# Patient Record
Sex: Female | Born: 1950 | Race: White | Hispanic: No | Marital: Married | Smoking: Never smoker
Health system: Southern US, Community
[De-identification: ages and names within clinical notes are randomized; demographics above are authoritative.]

## PROBLEM LIST (undated history)

## (undated) DIAGNOSIS — M797 Fibromyalgia: Secondary | ICD-10-CM

## (undated) DIAGNOSIS — E785 Hyperlipidemia, unspecified: Secondary | ICD-10-CM

## (undated) DIAGNOSIS — D259 Leiomyoma of uterus, unspecified: Secondary | ICD-10-CM

## (undated) DIAGNOSIS — F32A Depression, unspecified: Secondary | ICD-10-CM

## (undated) DIAGNOSIS — G4700 Insomnia, unspecified: Secondary | ICD-10-CM

## (undated) DIAGNOSIS — R112 Nausea with vomiting, unspecified: Secondary | ICD-10-CM

## (undated) DIAGNOSIS — M26609 Unspecified temporomandibular joint disorder, unspecified side: Secondary | ICD-10-CM

## (undated) DIAGNOSIS — Z9889 Other specified postprocedural states: Secondary | ICD-10-CM

## (undated) DIAGNOSIS — Z973 Presence of spectacles and contact lenses: Secondary | ICD-10-CM

## (undated) DIAGNOSIS — Z972 Presence of dental prosthetic device (complete) (partial): Secondary | ICD-10-CM

## (undated) DIAGNOSIS — F329 Major depressive disorder, single episode, unspecified: Secondary | ICD-10-CM

## (undated) HISTORY — PX: ABDOMINAL HYSTERECTOMY: SHX81

## (undated) HISTORY — PX: TOTAL ABDOMINAL HYSTERECTOMY: SHX209

## (undated) HISTORY — DX: Fibromyalgia: M79.7

## (undated) HISTORY — DX: Insomnia, unspecified: G47.00

## (undated) HISTORY — PX: OTHER SURGICAL HISTORY: SHX169

## (undated) HISTORY — DX: Depression, unspecified: F32.A

## (undated) HISTORY — DX: Leiomyoma of uterus, unspecified: D25.9

## (undated) HISTORY — DX: Hyperlipidemia, unspecified: E78.5

## (undated) HISTORY — DX: Unspecified temporomandibular joint disorder, unspecified side: M26.609

## (undated) HISTORY — PX: OOPHORECTOMY: SHX86

## (undated) HISTORY — DX: Major depressive disorder, single episode, unspecified: F32.9

---

## 2006-05-31 ENCOUNTER — Ambulatory Visit: Payer: Self-pay | Admitting: Obstetrics & Gynecology

## 2008-01-28 ENCOUNTER — Ambulatory Visit: Payer: Self-pay

## 2010-09-27 ENCOUNTER — Emergency Department: Payer: Self-pay | Admitting: Emergency Medicine

## 2012-05-07 ENCOUNTER — Ambulatory Visit: Payer: Self-pay | Admitting: Family Medicine

## 2012-05-08 ENCOUNTER — Ambulatory Visit: Payer: Self-pay | Admitting: Family Medicine

## 2012-05-20 ENCOUNTER — Ambulatory Visit: Payer: Self-pay | Admitting: Gastroenterology

## 2012-05-20 HISTORY — PX: COLONOSCOPY: SHX174

## 2012-11-05 ENCOUNTER — Ambulatory Visit: Payer: Self-pay | Admitting: Family Medicine

## 2013-07-17 ENCOUNTER — Ambulatory Visit: Payer: Self-pay | Admitting: Family Medicine

## 2014-05-08 DIAGNOSIS — F32A Depression, unspecified: Secondary | ICD-10-CM | POA: Insufficient documentation

## 2014-05-08 DIAGNOSIS — M797 Fibromyalgia: Secondary | ICD-10-CM | POA: Insufficient documentation

## 2014-05-08 DIAGNOSIS — F329 Major depressive disorder, single episode, unspecified: Secondary | ICD-10-CM | POA: Insufficient documentation

## 2014-05-08 DIAGNOSIS — N951 Menopausal and female climacteric states: Secondary | ICD-10-CM | POA: Insufficient documentation

## 2014-07-22 ENCOUNTER — Ambulatory Visit: Payer: Self-pay | Admitting: Family Medicine

## 2014-07-22 LAB — HM MAMMOGRAPHY: HM Mammogram: NORMAL (ref 0–4)

## 2014-11-18 DIAGNOSIS — E7800 Pure hypercholesterolemia, unspecified: Secondary | ICD-10-CM | POA: Insufficient documentation

## 2015-01-19 IMAGING — MG MM MAMMO DIAGNOSTIC UNILATERAL*L*
1 series · 6 of 6 positions shown · non-contrast
Comparison: none

REASON FOR EXAM: LT BR NODULARITY FU
COMMENTS:

[L CC · left · 6 of 6 slices shown]
[im 1/6]
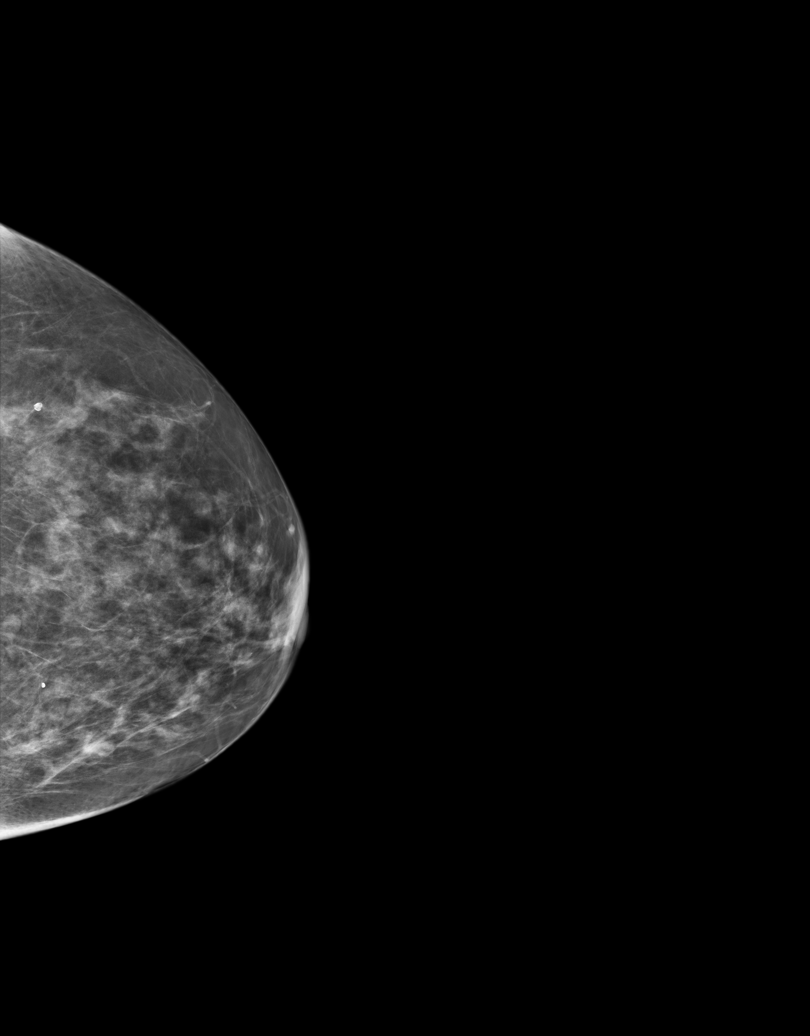
[im 2/6]
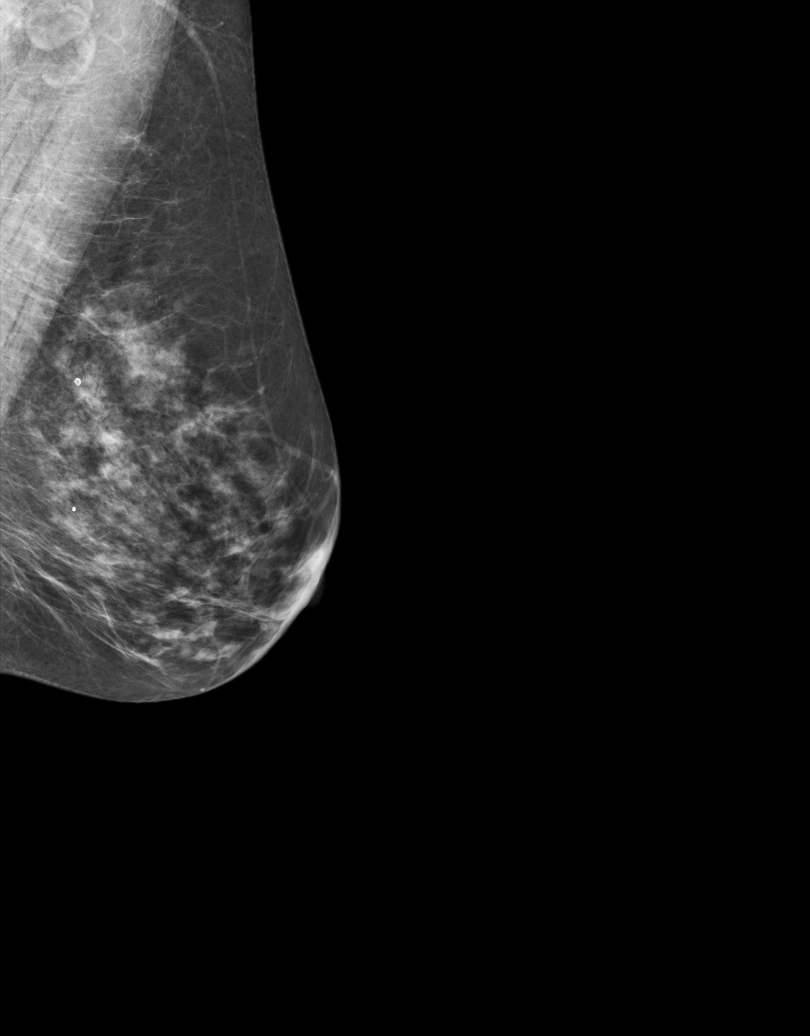
[im 3/6]
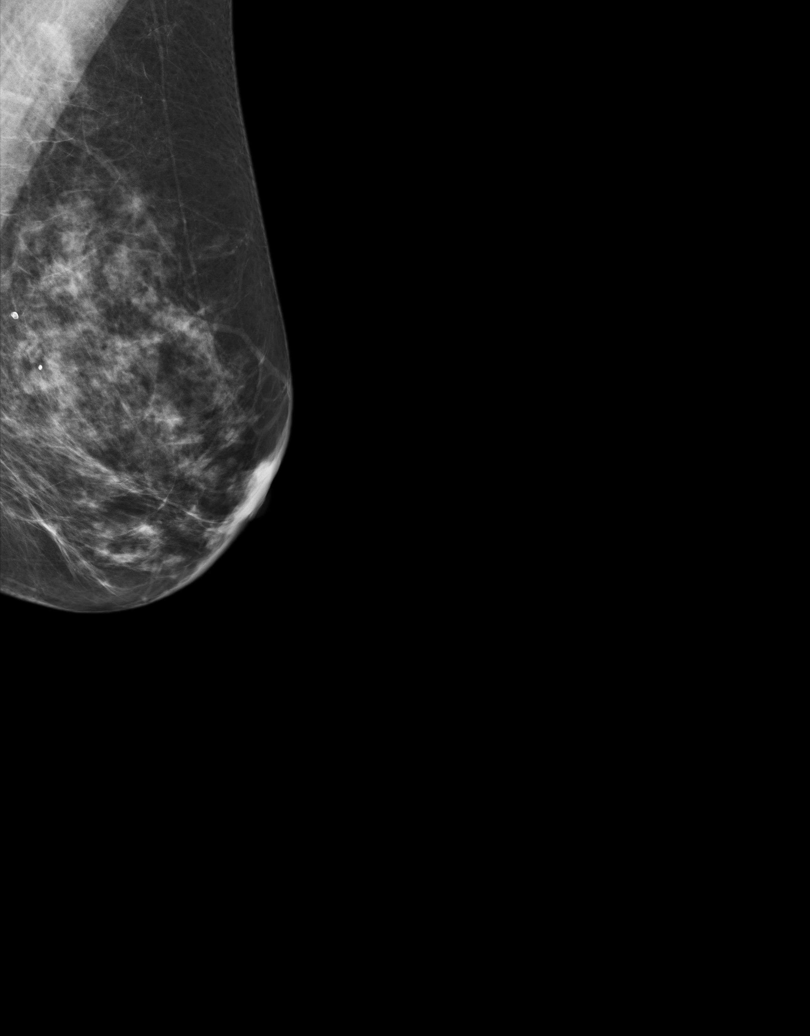
[im 4/6]
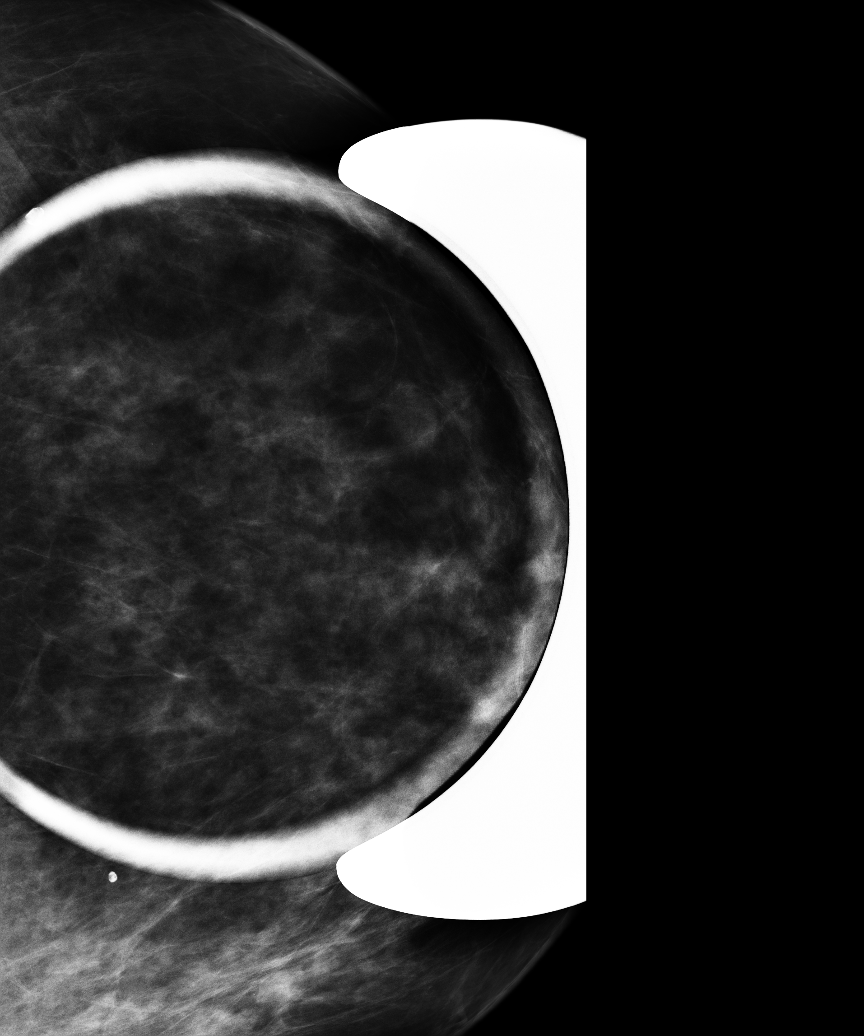
[im 5/6]
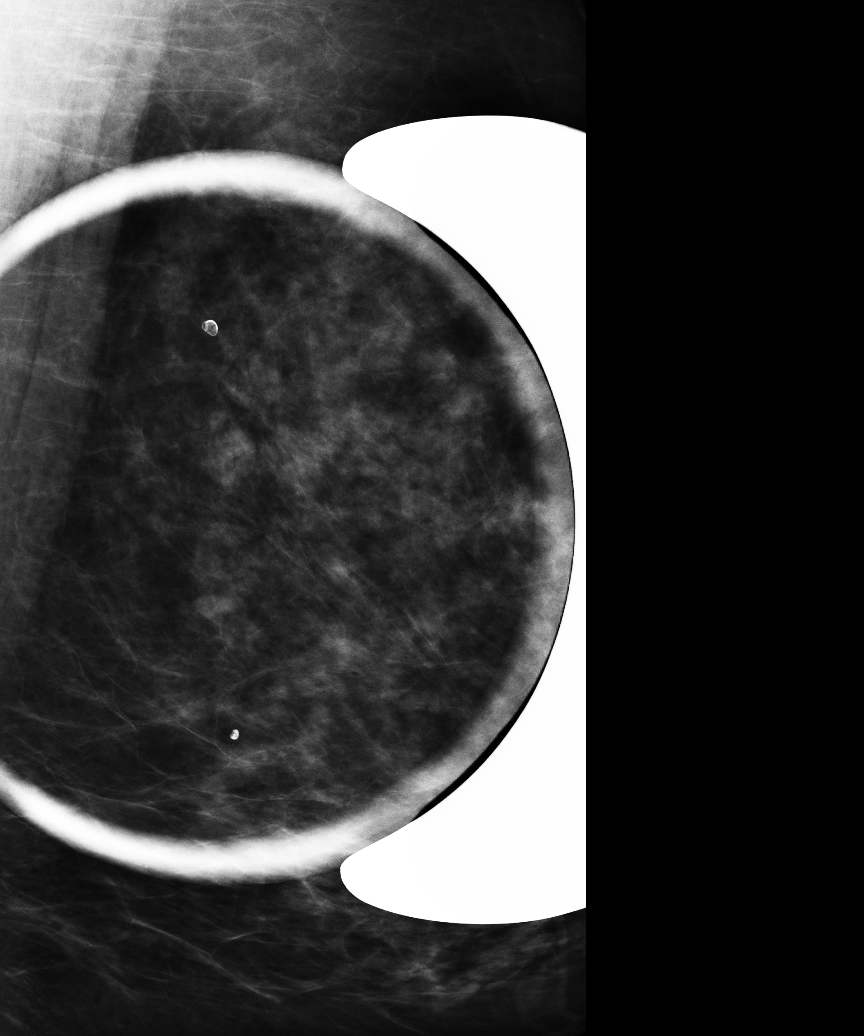
[im 6/6]
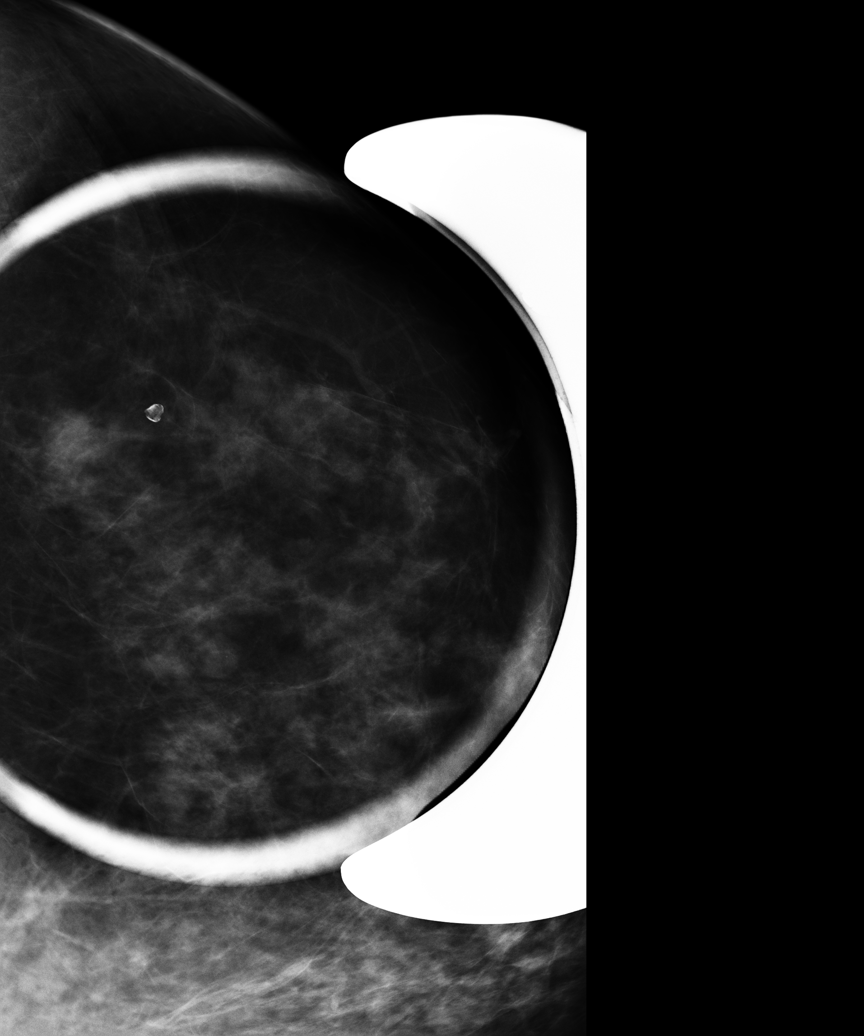

[6 of 6 positions shown; findings below may reference images not displayed]

PROCEDURE:     MAM - MAM DGTL UNI MAM LT BREAST W/CAD  - November 05, 2012  [DATE]

RESULT:     The patient returns for 6 month followup mammogram of the left
breast from the exam of May 07, 2012. At that time the patient was noted
to have nodularity on the left which at ultrasound was seen to be hypoechoic
without suspicious shadowing. The patient was asked to return for 6 month
followup mammography of the left breast to assure ongoing stability.

The left breast exhibits a heterogeneously dense parenchymal pattern with
evidence of ongoing involution. There is no dominant mass. There are
benign-appearing macrocalcifications. No areas of new architectural
distortion are demonstrated. An area of nodularity laterally on the left on
the cc view is unchanged. It does not appear as a discrete mass on the mlo
view.
IMPRESSION: I do not see findings suspicious for developing malignancy
on the left.

BI-RADS 3: An additional 6 month followup mammogram of the left breast is
recommended at which time the right breast would be due i.e. In May 2013.

BREAST COMPOSITION: The breast composition is HETEROGENEOUSLY DENSE
(glandular tissue is 51-75%) This may decrease the sensitivity of
mammography.

A NEGATIVE MAMMOGRAM REPORT DOES NOT PRECLUDE BIOPSY OR OTHER EVALUATION OF
A CLINICALLY PALPABLE OR OTHERWISE SUSPICIOUS MASS OR LESION. BREAST CANCER
MAY NOT BE DETECTED BY MAMMOGRAPHY IN UP TO 10% OF CASES.

[REDACTED]

## 2015-11-22 LAB — CBC AND DIFFERENTIAL
HCT: 38 % (ref 36–46)
HEMOGLOBIN: 12.7 g/dL (ref 12.0–16.0)
Platelets: 262 10*3/uL (ref 150–399)
WBC: 9.8 10*3/mL

## 2015-11-22 LAB — LIPID PANEL
Cholesterol: 222 mg/dL — AB (ref 0–200)
HDL: 60 mg/dL (ref 35–70)
LDL Cholesterol: 142 mg/dL
Triglycerides: 102 mg/dL (ref 40–160)

## 2015-11-22 LAB — BASIC METABOLIC PANEL
BUN: 15 mg/dL (ref 4–21)
CREATININE: 0.7 mg/dL (ref 0.5–1.1)
GLUCOSE: 89 mg/dL
POTASSIUM: 3.9 mmol/L (ref 3.4–5.3)
SODIUM: 139 mmol/L (ref 137–147)

## 2015-11-23 ENCOUNTER — Other Ambulatory Visit: Payer: Self-pay | Admitting: Family Medicine

## 2015-11-23 DIAGNOSIS — Z1231 Encounter for screening mammogram for malignant neoplasm of breast: Secondary | ICD-10-CM

## 2016-10-17 DIAGNOSIS — M797 Fibromyalgia: Secondary | ICD-10-CM

## 2016-12-05 ENCOUNTER — Encounter: Payer: Self-pay | Admitting: Family Medicine

## 2016-12-05 ENCOUNTER — Ambulatory Visit (INDEPENDENT_AMBULATORY_CARE_PROVIDER_SITE_OTHER): Payer: Self-pay | Admitting: Family Medicine

## 2016-12-05 VITALS — BP 112/64 | HR 76 | Temp 98.1°F | Ht 63.0 in | Wt 144.8 lb

## 2016-12-05 DIAGNOSIS — F3342 Major depressive disorder, recurrent, in full remission: Secondary | ICD-10-CM

## 2016-12-05 DIAGNOSIS — F5101 Primary insomnia: Secondary | ICD-10-CM

## 2016-12-05 DIAGNOSIS — E782 Mixed hyperlipidemia: Secondary | ICD-10-CM

## 2016-12-05 DIAGNOSIS — G4700 Insomnia, unspecified: Secondary | ICD-10-CM | POA: Insufficient documentation

## 2016-12-05 DIAGNOSIS — M797 Fibromyalgia: Secondary | ICD-10-CM

## 2016-12-05 MED ORDER — GABAPENTIN 300 MG PO CAPS: 900.0000 mg | ORAL_CAPSULE | Freq: Two times a day (BID) | ORAL | 1 refills | Status: DC

## 2016-12-05 MED ORDER — CITALOPRAM HYDROBROMIDE 40 MG PO TABS: 40.0000 mg | ORAL_TABLET | Freq: Every day | ORAL | 1 refills | Status: DC

## 2016-12-05 NOTE — Assessment & Plan Note (Signed)
Will recheck at physical. Continue diet and exercise.

## 2016-12-05 NOTE — Assessment & Plan Note (Signed)
Under good control on current regimen. Refills given. Call with any concerns.  

## 2016-12-05 NOTE — Assessment & Plan Note (Signed)
Under good control on current regimen. Discussed with patient that we don't prescribe controlled substances on the first visit. She is aware. Continue to monitor.

## 2016-12-05 NOTE — Progress Notes (Signed)
BP 112/64   Pulse 76   Temp 98.1 F (36.7 C)   Ht 5\' 3"  (1.6 m)   Wt 144 lb 12.8 oz (65.7 kg)   SpO2 97%   BMI 25.65 kg/m    Subjective:    Patient ID: Kathryn Conway, female    DOB: 06-29-1951, 66 y.o.   MRN: [id number]  HPI: Kathryn Conway is a 66 y.o. female  Chief Complaint  Patient presents with  . Establish Care   FIBROMYALGIA Pain status: controlled Satisfied with current treatment?: yes Medication side effects: no Medication compliance: excellent compliance Duration: chronic Location: generalized Quality: dull and aching Current pain level: mild Previous pain level: moderate Aggravating factors: stress Alleviating factors: medication Previous pain specialty evaluation: yes Non-narcotic analgesic meds: yes Narcotic contract:no Treatments attempted: celexa, gabapentin    DEPRESSION- patient denies any depression, states that her fibro has been treated with the celexa Mood status: controlled Satisfied with current treatment?: yes Symptom severity: mild  Duration of current treatment : chronic Side effects: no Medication compliance: excellent compliance Psychotherapy/counseling: no  Previous psychiatric medications: celexa Depressed mood: no Anxious mood: no Anhedonia: no Significant weight loss or gain: no Insomnia: yes  Fatigue: yes Feelings of worthlessness or guilt: no Impaired concentration/indecisiveness: no Suicidal ideations: no Hopelessness: no Crying spells: no Depression screen PHQ 2/9 12/05/2016  Decreased Interest 0  Down, Depressed, Hopeless 0  PHQ - 2 Score 0    HYPERLIPIDEMIA Hyperlipidemia status: Not on anything Satisfied with current treatment?  yes Side effects: Not on anything Past cholesterol meds: none Supplements: none Aspirin:  no The 10-year ASCVD risk score [name] DC Jr., et al., 2013) is: 3.6%   Values used to calculate the score:     Age: 46 years     Sex: Female     Is Non-Hispanic African American:  No     Diabetic: No     Tobacco smoker: No     Systolic Blood Pressure: 109 mmHg     Is BP treated: No     HDL Cholesterol: 60 mg/dL     Total Cholesterol: 222 mg/dL Chest pain:  no Coronary artery disease:  no Family history CAD:  yes  ELEVATED BLOOD PRESSURE- had extra coffee today Duration of elevated BP: unknown BP monitoring frequency: not checking Previous BP meds: no Recent stressors: no Family history of hypertension: yes Recurrent headaches: no Visual changes: no Palpitations: no  Dyspnea: no Chest pain: no Lower extremity edema: no Dizzy/lightheaded: no Transient ischemic attacks: yes- about 5 years ago  INSOMNIA- has only taken [place] for it. Tries to take [place] as needed, usually 4-5x a week Duration: chronic- since she was in her 53s Satisfied with sleep quality: no Difficulty falling asleep: yes Difficulty staying asleep: yes Waking a few hours after sleep onset: yes Early morning awakenings: yes Daytime hypersomnolence: yes Wakes feeling refreshed: no Good sleep hygiene: yes Apnea: no Snoring: no Depressed/anxious mood: no Recent stress: no Restless legs/nocturnal leg cramps: no Chronic pain/arthritis: no History of sleep study: no Treatments attempted: benadryl and ambien    Active Ambulatory Problems    Diagnosis Date Noted  . Depression   . Fibromyalgia   . Hyperlipidemia 12/05/2016  . Insomnia 12/05/2016   Resolved Ambulatory Problems    Diagnosis Date Noted  . No Resolved Ambulatory Problems   Past Medical History:  Diagnosis Date  . Depression   . Fibromyalgia   . Hyperlipidemia   . Insomnia   . TMJ (temporomandibular  joint disorder)   . Uterine fibroid    Past Surgical History:  Procedure Laterality Date  . ABDOMINAL HYSTERECTOMY    . COLONOSCOPY  05/20/2012   repeat 5 years  . laparoscopic gyn procedure     done before her hysterectomy  . OOPHORECTOMY Left    Outpatient Encounter Prescriptions as of 12/05/2016   Medication Sig  . aspirin EC 81 MG tablet Take 81 mg by mouth daily.  . citalopram (CELEXA) 40 MG tablet Take 1 tablet (40 mg total) by mouth daily.  Marland Kitchen gabapentin (NEURONTIN) 300 MG capsule Take 3 capsules (900 mg total) by mouth 2 (two) times daily.  Marland Kitchen loratadine (CLARITIN) 10 MG tablet Take 10 mg by mouth daily.  Marland Kitchen zolpidem (AMBIEN) 10 MG tablet Take 5 mg by mouth at bedtime as needed.   . [DISCONTINUED] citalopram (CELEXA) 40 MG tablet Take by mouth.  . [DISCONTINUED] gabapentin (NEURONTIN) 300 MG capsule TAKE TWO CAPSULES BY MOUTH THREE TIMES DAILY   No facility-administered encounter medications on file as of 12/05/2016.    Allergies  Allergen Reactions  . Sulfa Antibiotics Rash   Family History  Problem Relation Age of Onset  . Heart disease Mother   . Hypertension Mother   . Depression Mother   . Osteoporosis Mother   . Colon polyps Mother   . Cirrhosis Mother        non alcoholic  . Liver disease Mother        fatty liver disease  . Heart disease Father   . Hypertension Father   . Colon polyps Father   . Stroke Father   . Heart disease Sister   . Hypertension Sister   . Colon polyps Sister   . Diabetes Sister   . Hypertension Daughter   . Colon polyps Daughter   . Heart attack Brother   . Hypertension Brother   . Heart attack Brother   . Hyperlipidemia Son    Social History   Social History  . Marital status: Single    Spouse name: N/A  . Number of children: N/A  . Years of education: N/A   Social History Main Topics  . Smoking status: Never Smoker  . Smokeless tobacco: Never Used  . Alcohol use 1.8 oz/week    3 Glasses of wine per week  . Drug use: No  . Sexual activity: Yes   Other Topics Concern  . None   Social History Narrative  . None   Review of Systems  Constitutional: Negative.   Respiratory: Negative.   Cardiovascular: Negative.   Musculoskeletal: Positive for arthralgias and myalgias. Negative for back pain, gait problem, joint  swelling, neck pain and neck stiffness.  Psychiatric/Behavioral: Positive for decreased concentration and sleep disturbance. Negative for agitation, behavioral problems, confusion, dysphoric mood, hallucinations, self-injury and suicidal ideas. The patient is not nervous/anxious and is not hyperactive.     Per HPI unless specifically indicated above     Objective:    BP 112/64   Pulse 76   Temp 98.1 F (36.7 C)   Ht 5\' 3"  (1.6 m)   Wt 144 lb 12.8 oz (65.7 kg)   SpO2 97%   BMI 25.65 kg/m   Wt Readings from Last 3 Encounters:  12/05/16 144 lb 12.8 oz (65.7 kg)    Physical Exam  Constitutional: She is oriented to person, place, and time. She appears well-developed and well-nourished. No distress.  HENT:  Head: Normocephalic and atraumatic.  Right Ear: Hearing normal.  Left  Ear: Hearing normal.  Nose: Nose normal.  Eyes: Conjunctivae and lids are normal. Right eye exhibits no discharge. Left eye exhibits no discharge. No scleral icterus.  Cardiovascular: Normal rate, regular rhythm, normal heart sounds and intact distal pulses.  Exam reveals no gallop and no friction rub.   No murmur heard. Pulmonary/Chest: Effort normal and breath sounds normal. No respiratory distress. She has no wheezes. She has no rales. She exhibits no tenderness.  Musculoskeletal: Normal range of motion.  Neurological: She is alert and oriented to person, place, and time.  Skin: Skin is warm, dry and intact. No rash noted. She is not diaphoretic. No erythema. No pallor.  Psychiatric: She has a normal mood and affect. Her speech is normal and behavior is normal. Judgment and thought content normal. Cognition and memory are normal.  Nursing note and vitals reviewed.   Results for orders placed or performed in visit on 12/05/16  CBC and differential  Result Value Ref Range   Hemoglobin 12.7 12.0 - 16.0 g/dL   HCT 38 36 - 46 %   Platelets 262 150 - 399 K/L   WBC 9.8 93^2/TF  Basic metabolic panel  Result  Value Ref Range   Glucose 89 mg/dL   BUN 15 4 - 21 mg/dL   Creatinine 0.7 0.5 - 1.1 mg/dL   Potassium 3.9 3.4 - 5.3 mmol/L   Sodium 139 137 - 147 mmol/L  Lipid panel  Result Value Ref Range   Triglycerides 102 40 - 160 mg/dL   Cholesterol 222 (A) 0 - 200 mg/dL   HDL 60 35 - 70 mg/dL   LDL Cholesterol 142 mg/dL      Assessment & Plan:   Problem List Items Addressed This Visit      Other   Depression - Primary    Under good control on current regimen. Refills given. Call with any concerns.       Relevant Medications   citalopram (CELEXA) 40 MG tablet   Fibromyalgia    Under good control on current regimen. Refills given. Call with any concerns.       Hyperlipidemia    Will recheck at physical. Continue diet and exercise.       Relevant Medications   aspirin EC 81 MG tablet   Insomnia    Under good control on current regimen. Discussed with patient that we don't prescribe controlled substances on the first visit. She is aware. Continue to monitor.           Follow up plan: Return 1-6 months, for Physical and insomnia.

## 2016-12-21 ENCOUNTER — Encounter: Payer: Self-pay | Admitting: Family Medicine

## 2016-12-21 ENCOUNTER — Ambulatory Visit (INDEPENDENT_AMBULATORY_CARE_PROVIDER_SITE_OTHER): Payer: Self-pay | Admitting: Family Medicine

## 2016-12-21 VITALS — BP 125/79 | HR 72 | Temp 98.6°F | Ht 64.1 in | Wt 144.1 lb

## 2016-12-21 DIAGNOSIS — Z0000 Encounter for general adult medical examination without abnormal findings: Secondary | ICD-10-CM

## 2016-12-21 DIAGNOSIS — F5101 Primary insomnia: Secondary | ICD-10-CM

## 2016-12-21 MED ORDER — ZOLPIDEM TARTRATE 10 MG PO TABS: 5.0000 mg | ORAL_TABLET | Freq: Every evening | ORAL | 5 refills | Status: DC | PRN

## 2016-12-21 MED ORDER — ZOLPIDEM TARTRATE 10 MG PO TABS: 5.0000 mg | ORAL_TABLET | Freq: Every evening | ORAL | 1 refills | Status: DC | PRN

## 2016-12-21 NOTE — Patient Instructions (Addendum)
Health Maintenance, Female Adopting a healthy lifestyle and getting preventive care can go a long way to promote health and wellness. Talk with your health care provider about what schedule of regular examinations is right for you. This is a good chance for you to check in with your provider about disease prevention and staying healthy. In between checkups, there are plenty of things you can do on your own. Experts have done a lot of research about which lifestyle changes and preventive measures are most likely to keep you healthy. Ask your health care provider for more information. Weight and diet Eat a healthy diet  Be sure to include plenty of vegetables, fruits, low-fat dairy products, and lean protein.  Do not eat a lot of foods high in solid fats, added sugars, or salt.  Get regular exercise. This is one of the most important things you can do for your health. ? Most adults should exercise for at least 150 minutes each week. The exercise should increase your heart rate and make you sweat (moderate-intensity exercise). ? Most adults should also do strengthening exercises at least twice a week. This is in addition to the moderate-intensity exercise.  Maintain a healthy weight  Body mass index (BMI) is a measurement that can be used to identify possible weight problems. It estimates body fat based on height and weight. Your health care provider can help determine your BMI and help you achieve or maintain a healthy weight.  For females 20 years of age and older: ? A BMI below 18.5 is considered underweight. ? A BMI of 18.5 to 24.9 is normal. ? A BMI of 25 to 29.9 is considered overweight. ? A BMI of 30 and above is considered obese.  Watch levels of cholesterol and blood lipids  You should start having your blood tested for lipids and cholesterol at 64 years of age, then have this test every 5 years.  You may need to have your cholesterol levels checked more often if: ? Your lipid or  cholesterol levels are high. ? You are older than 64 years of age. ? You are at high risk for heart disease.  Cancer screening Lung Cancer  Lung cancer screening is recommended for adults 55-80 years old who are at high risk for lung cancer because of a history of smoking.  A yearly low-dose CT scan of the lungs is recommended for people who: ? Currently smoke. ? Have quit within the past 15 years. ? Have at least a 30-pack-year history of smoking. A pack year is smoking an average of one pack of cigarettes a day for 1 year.  Yearly screening should continue until it has been 15 years since you quit.  Yearly screening should stop if you develop a health problem that would prevent you from having lung cancer treatment.  Breast Cancer  Practice breast self-awareness. This means understanding how your breasts normally appear and feel.  It also means doing regular breast self-exams. Let your health care provider know about any changes, no matter how small.  If you are in your 20s or 30s, you should have a clinical breast exam (CBE) by a health care provider every 1-3 years as part of a regular health exam.  If you are 40 or older, have a CBE every year. Also consider having a breast X-ray (mammogram) every year.  If you have a family history of breast cancer, talk to your health care provider about genetic screening.  If you are at high risk   for breast cancer, talk to your health care provider about having an MRI and a mammogram every year.  Breast cancer gene (BRCA) assessment is recommended for women who have family members with BRCA-related cancers. BRCA-related cancers include: ? Breast. ? Ovarian. ? Tubal. ? Peritoneal cancers.  Results of the assessment will determine the need for genetic counseling and BRCA1 and BRCA2 testing.  Cervical Cancer Your health care provider may recommend that you be screened regularly for cancer of the pelvic organs (ovaries, uterus, and  vagina). This screening involves a pelvic examination, including checking for microscopic changes to the surface of your cervix (Pap test). You may be encouraged to have this screening done every 3 years, beginning at age 22.  For women ages 56-65, health care providers may recommend pelvic exams and Pap testing every 3 years, or they may recommend the Pap and pelvic exam, combined with testing for human papilloma virus (HPV), every 5 years. Some types of HPV increase your risk of cervical cancer. Testing for HPV may also be done on women of any age with unclear Pap test results.  Other health care providers may not recommend any screening for nonpregnant women who are considered low risk for pelvic cancer and who do not have symptoms. Ask your health care provider if a screening pelvic exam is right for you.  If you have had past treatment for cervical cancer or a condition that could lead to cancer, you need Pap tests and screening for cancer for at least 20 years after your treatment. If Pap tests have been discontinued, your risk factors (such as having a new sexual partner) need to be reassessed to determine if screening should resume. Some women have medical problems that increase the chance of getting cervical cancer. In these cases, your health care provider may recommend more frequent screening and Pap tests.  Colorectal Cancer  This type of cancer can be detected and often prevented.  Routine colorectal cancer screening usually begins at 64 years of age and continues through 64 years of age.  Your health care provider may recommend screening at an earlier age if you have risk factors for colon cancer.  Your health care provider may also recommend using home test kits to check for hidden blood in the stool.  A small camera at the end of a tube can be used to examine your colon directly (sigmoidoscopy or colonoscopy). This is done to check for the earliest forms of colorectal  cancer.  Routine screening usually begins at age 33.  Direct examination of the colon should be repeated every 5-10 years through 64 years of age. However, you may need to be screened more often if early forms of precancerous polyps or small growths are found.  Skin Cancer  Check your skin from head to toe regularly.  Tell your health care provider about any new moles or changes in moles, especially if there is a change in a mole's shape or color.  Also tell your health care provider if you have a mole that is larger than the size of a pencil eraser.  Always use sunscreen. Apply sunscreen liberally and repeatedly throughout the day.  Protect yourself by wearing long sleeves, pants, a wide-brimmed hat, and sunglasses whenever you are outside.  Heart disease, diabetes, and high blood pressure  High blood pressure causes heart disease and increases the risk of stroke. High blood pressure is more likely to develop in: ? People who have blood pressure in the high end of  the normal range (130-139/85-89 mm Hg). ? People who are overweight or obese. ? People who are African American.  If you are 21-29 years of age, have your blood pressure checked every 3-5 years. If you are 3 years of age or older, have your blood pressure checked every year. You should have your blood pressure measured twice-once when you are at a hospital or clinic, and once when you are not at a hospital or clinic. Record the average of the two measurements. To check your blood pressure when you are not at a hospital or clinic, you can use: ? An automated blood pressure machine at a pharmacy. ? A home blood pressure monitor.  If you are between 17 years and 37 years old, ask your health care provider if you should take aspirin to prevent strokes.  Have regular diabetes screenings. This involves taking a blood sample to check your fasting blood sugar level. ? If you are at a normal weight and have a low risk for diabetes,  have this test once every three years after 64 years of age. ? If you are overweight and have a high risk for diabetes, consider being tested at a younger age or more often. Preventing infection Hepatitis B  If you have a higher risk for hepatitis B, you should be screened for this virus. You are considered at high risk for hepatitis B if: ? You were born in a country where hepatitis B is common. Ask your health care provider which countries are considered high risk. ? Your parents were born in a high-risk country, and you have not been immunized against hepatitis B (hepatitis B vaccine). ? You have HIV or AIDS. ? You use needles to inject street drugs. ? You live with someone who has hepatitis B. ? You have had sex with someone who has hepatitis B. ? You get hemodialysis treatment. ? You take certain medicines for conditions, including cancer, organ transplantation, and autoimmune conditions.  Hepatitis C  Blood testing is recommended for: ? Everyone born from [birth year] through 1963. ? Anyone with known risk factors for hepatitis C.  Sexually transmitted infections (STIs)  You should be screened for sexually transmitted infections (STIs) including gonorrhea and chlamydia if: ? You are sexually active and are younger than 64 years of age. ? You are older than 64 years of age and your health care provider tells you that you are at risk for this type of infection. ? Your sexual activity has changed since you were last screened and you are at an increased risk for chlamydia or gonorrhea. Ask your health care provider if you are at risk.  If you do not have HIV, but are at risk, it may be recommended that you take a prescription medicine daily to prevent HIV infection. This is called pre-exposure prophylaxis (PrEP). You are considered at risk if: ? You are sexually active and do not regularly use condoms or know the HIV status of your partner(s). ? You take drugs by injection. ? You are  sexually active with a partner who has HIV.  Talk with your health care provider about whether you are at high risk of being infected with HIV. If you choose to begin PrEP, you should first be tested for HIV. You should then be tested every 3 months for as long as you are taking PrEP. Pregnancy  If you are premenopausal and you may become pregnant, ask your health care provider about preconception counseling.  If you may become  pregnant, take 400 to 800 micrograms (mcg) of folic acid every day.  If you want to prevent pregnancy, talk to your health care provider about birth control (contraception). Osteoporosis and menopause  Osteoporosis is a disease in which the bones lose minerals and strength with aging. This can result in serious bone fractures. Your risk for osteoporosis can be identified using a bone density scan.  If you are 28 years of age or older, or if you are at risk for osteoporosis and fractures, ask your health care provider if you should be screened.  Ask your health care provider whether you should take a calcium or vitamin D supplement to lower your risk for osteoporosis.  Menopause may have certain physical symptoms and risks.  Hormone replacement therapy may reduce some of these symptoms and risks. Talk to your health care provider about whether hormone replacement therapy is right for you. Follow these instructions at home:  Schedule regular health, dental, and eye exams.  Stay current with your immunizations.  Do not use any tobacco products including cigarettes, chewing tobacco, or electronic cigarettes.  If you are pregnant, do not drink alcohol.  If you are breastfeeding, limit how much and how often you drink alcohol.  Limit alcohol intake to no more than 1 drink per day for nonpregnant women. One drink equals 12 ounces of beer, 5 ounces of wine, or 1 ounces of hard liquor.  Do not use street drugs.  Do not share needles.  Ask your health care  provider for help if you need support or information about quitting drugs.  Tell your health care provider if you often feel depressed.  Tell your health care provider if you have ever been abused or do not feel safe at home. This information is not intended to replace advice given to you by your health care provider. Make sure you discuss any questions you have with your health care provider. Document Released: 01/02/2011 Document Revised: 11/25/2015 Document Reviewed: 03/23/2015 Elsevier Interactive Patient Education  2018 [place] Maintenance for Postmenopausal Women Menopause is a normal process in which your reproductive ability comes to an end. This process happens gradually over a span of months to years, usually between the ages of 96 and 42. Menopause is complete when you have missed 12 consecutive menstrual periods. It is important to talk with your health care provider about some of the most common conditions that affect postmenopausal women, such as heart disease, cancer, and bone loss (osteoporosis). Adopting a healthy lifestyle and getting preventive care can help to promote your health and wellness. Those actions can also lower your chances of developing some of these common conditions. What should I know about menopause? During menopause, you may experience a number of symptoms, such as:  Moderate-to-severe hot flashes.  Night sweats.  Decrease in sex drive.  Mood swings.  Headaches.  Tiredness.  Irritability.  Memory problems.  Insomnia.  Choosing to treat or not to treat menopausal changes is an individual decision that you make with your health care provider. What should I know about hormone replacement therapy and supplements? Hormone therapy products are effective for treating symptoms that are associated with menopause, such as hot flashes and night sweats. Hormone replacement carries certain risks, especially as you become older. If you are  thinking about using estrogen or estrogen with progestin treatments, discuss the benefits and risks with your health care provider. What should I know about heart disease and stroke? Heart disease, heart attack, and stroke  become more likely as you age. This may be due, in part, to the hormonal changes that your body experiences during menopause. These can affect how your body processes dietary fats, triglycerides, and cholesterol. Heart attack and stroke are both medical emergencies. There are many things that you can do to help prevent heart disease and stroke:  Have your blood pressure checked at least every 1-2 years. High blood pressure causes heart disease and increases the risk of stroke.  If you are 55-79 years old, ask your health care provider if you should take aspirin to prevent a heart attack or a stroke.  Do not use any tobacco products, including cigarettes, chewing tobacco, or electronic cigarettes. If you need help quitting, ask your health care provider.  It is important to eat a healthy diet and maintain a healthy weight. ? Be sure to include plenty of vegetables, fruits, low-fat dairy products, and lean protein. ? Avoid eating foods that are high in solid fats, added sugars, or salt (sodium).  Get regular exercise. This is one of the most important things that you can do for your health. ? Try to exercise for at least 150 minutes each week. The type of exercise that you do should increase your heart rate and make you sweat. This is known as moderate-intensity exercise. ? Try to do strengthening exercises at least twice each week. Do these in addition to the moderate-intensity exercise.  Know your numbers.Ask your health care provider to check your cholesterol and your blood glucose. Continue to have your blood tested as directed by your health care provider.  What should I know about cancer screening? There are several types of cancer. Take the following steps to reduce  your risk and to catch any cancer development as early as possible. Breast Cancer  Practice breast self-awareness. ? This means understanding how your breasts normally appear and feel. ? It also means doing regular breast self-exams. Let your health care provider know about any changes, no matter how small.  If you are 40 or older, have a clinician do a breast exam (clinical breast exam or CBE) every year. Depending on your age, family history, and medical history, it may be recommended that you also have a yearly breast X-ray (mammogram).  If you have a family history of breast cancer, talk with your health care provider about genetic screening.  If you are at high risk for breast cancer, talk with your health care provider about having an MRI and a mammogram every year.  Breast cancer (BRCA) gene test is recommended for women who have family members with BRCA-related cancers. Results of the assessment will determine the need for genetic counseling and BRCA1 and for BRCA2 testing. BRCA-related cancers include these types: ? Breast. This occurs in males or females. ? Ovarian. ? Tubal. This may also be called fallopian tube cancer. ? Cancer of the abdominal or pelvic lining (peritoneal cancer). ? Prostate. ? Pancreatic.  Cervical, Uterine, and Ovarian Cancer Your health care provider may recommend that you be screened regularly for cancer of the pelvic organs. These include your ovaries, uterus, and vagina. This screening involves a pelvic exam, which includes checking for microscopic changes to the surface of your cervix (Pap test).  For women ages 21-65, health care providers may recommend a pelvic exam and a Pap test every three years. For women ages 30-65, they may recommend the Pap test and pelvic exam, combined with testing for human papilloma virus (HPV), every five years. Some types   of HPV increase your risk of cervical cancer. Testing for HPV may also be done on women of any age who  have unclear Pap test results.  Other health care providers may not recommend any screening for nonpregnant women who are considered low risk for pelvic cancer and have no symptoms. Ask your health care provider if a screening pelvic exam is right for you.  If you have had past treatment for cervical cancer or a condition that could lead to cancer, you need Pap tests and screening for cancer for at least 20 years after your treatment. If Pap tests have been discontinued for you, your risk factors (such as having a new sexual partner) need to be reassessed to determine if you should start having screenings again. Some women have medical problems that increase the chance of getting cervical cancer. In these cases, your health care provider may recommend that you have screening and Pap tests more often.  If you have a family history of uterine cancer or ovarian cancer, talk with your health care provider about genetic screening.  If you have vaginal bleeding after reaching menopause, tell your health care provider.  There are currently no reliable tests available to screen for ovarian cancer.  Lung Cancer Lung cancer screening is recommended for adults 20-34 years old who are at high risk for lung cancer because of a history of smoking. A yearly low-dose CT scan of the lungs is recommended if you:  Currently smoke.  Have a history of at least 30 pack-years of smoking and you currently smoke or have quit within the past 15 years. A pack-year is smoking an average of one pack of cigarettes per day for one year.  Yearly screening should:  Continue until it has been 15 years since you quit.  Stop if you develop a health problem that would prevent you from having lung cancer treatment.  Colorectal Cancer  This type of cancer can be detected and can often be prevented.  Routine colorectal cancer screening usually begins at age 91 and continues through age 40.  If you have risk factors for colon  cancer, your health care provider may recommend that you be screened at an earlier age.  If you have a family history of colorectal cancer, talk with your health care provider about genetic screening.  Your health care provider may also recommend using home test kits to check for hidden blood in your stool.  A small camera at the end of a tube can be used to examine your colon directly (sigmoidoscopy or colonoscopy). This is done to check for the earliest forms of colorectal cancer.  Direct examination of the colon should be repeated every 5-10 years until age 59. However, if early forms of precancerous polyps or small growths are found or if you have a family history or genetic risk for colorectal cancer, you may need to be screened more often.  Skin Cancer  Check your skin from head to toe regularly.  Monitor any moles. Be sure to tell your health care provider: ? About any new moles or changes in moles, especially if there is a change in a mole's shape or color. ? If you have a mole that is larger than the size of a pencil eraser.  If any of your family members has a history of skin cancer, especially at a young age, talk with your health care provider about genetic screening.  Always use sunscreen. Apply sunscreen liberally and repeatedly throughout the day.  Whenever you are outside, protect yourself by wearing long sleeves, pants, a wide-brimmed hat, and sunglasses.  What should I know about osteoporosis? Osteoporosis is a condition in which bone destruction happens more quickly than new bone creation. After menopause, you may be at an increased risk for osteoporosis. To help prevent osteoporosis or the bone fractures that can happen because of osteoporosis, the following is recommended:  If you are 13-49 years old, get at least 1,000 mg of calcium and at least 600 mg of vitamin D per day.  If you are older than age 83 but younger than age 38, get at least 1,200 mg of calcium and  at least 600 mg of vitamin D per day.  If you are older than age 77, get at least 1,200 mg of calcium and at least 800 mg of vitamin D per day.  Smoking and excessive alcohol intake increase the risk of osteoporosis. Eat foods that are rich in calcium and vitamin D, and do weight-bearing exercises several times each week as directed by your health care provider. What should I know about how menopause affects my mental health? Depression may occur at any age, but it is more common as you become older. Common symptoms of depression include:  Low or sad mood.  Changes in sleep patterns.  Changes in appetite or eating patterns.  Feeling an overall lack of motivation or enjoyment of activities that you previously enjoyed.  Frequent crying spells.  Talk with your health care provider if you think that you are experiencing depression. What should I know about immunizations? It is important that you get and maintain your immunizations. These include:  Tetanus, diphtheria, and pertussis (Tdap) booster vaccine.  Influenza every year before the flu season begins.  Pneumonia vaccine.  Shingles vaccine.  Your health care provider may also recommend other immunizations. This information is not intended to replace advice given to you by your health care provider. Make sure you discuss any questions you have with your health care provider. Document Released: 08/11/2005 Document Revised: 01/07/2016 Document Reviewed: 03/23/2015 Elsevier Interactive Patient Education  2018 [employer].

## 2016-12-21 NOTE — Progress Notes (Deleted)
There were no vitals taken for this visit.   Subjective:    Patient ID: Kathryn Conway, female    DOB: 1950/09/10, 66 y.o.   MRN: [id number]  HPI: Kathryn Conway is a 66 y.o. female  No chief complaint on file.  HYPERLIPIDEMIA Hyperlipidemia status: {Blank single:19197::"excellent compliance","good compliance","fair compliance","poor compliance"} Satisfied with current treatment?  {Blank single:19197::"yes","no"} Side effects:  {Blank single:19197::"yes","no"} Medication compliance: {Blank single:19197::"excellent compliance","good compliance","fair compliance","poor compliance"} Past cholesterol meds: {Blank multiple:19196::"none","atorvastain (lipitor)","lovastatin (mevacor)","pravastatin (pravachol)","rosuvastatin (crestor)","simvastatin (zocor)","vytorin","fenofibrate (tricor)","gemfibrozil","ezetimide (zetia)","niaspan","lovaza"} Supplements: {Blank multiple:19196::"none","fish oil","niacin","red yeast rice"} Aspirin:  {Blank single:19197::"yes","no"} The 10-year ASCVD risk score [name] DC Jr., et al., 2013) is: 3.6%   Values used to calculate the score:     Age: 76 years     Sex: Female     Is Non-Hispanic African American: No     Diabetic: No     Tobacco smoker: No     Systolic Blood Pressure: 785 mmHg     Is BP treated: No     HDL Cholesterol: 60 mg/dL     Total Cholesterol: 222 mg/dL Chest pain:  {Blank single:19197::"yes","no"} Coronary artery disease:  {Blank single:19197::"yes","no"} Family history CAD:  {Blank single:19197::"yes","no"} Family history early CAD:  {Blank single:19197::"yes","no"}  DEPRESSION Mood status: {Blank single:19197::"controlled","uncontrolled","better","worse","exacerbated","stable"} Satisfied with current treatment?: {Blank single:19197::"yes","no"} Symptom severity: {Blank single:19197::"mild","moderate","severe"}  Duration of current treatment : {Blank single:19197::"chronic","months","years"} Side effects: {Blank  single:19197::"yes","no"} Medication compliance: {Blank single:19197::"excellent compliance","good compliance","fair compliance","poor compliance"} Psychotherapy/counseling: {Blank single:19197::"yes","no"} {Blank single:19197::"current","in the past"} Previous psychiatric medications: {Blank multiple:19196::"abilify","amitryptiline","buspar","celexa","cymbalta","depakote","effexor","lamictal","lexapro","lithium","nortryptiline","paxil","prozac","pristiq (desvenlafaxine","seroquel","wellbutrin","zoloft","zyprexa"} Depressed mood: {Blank single:19197::"yes","no"} Anxious mood: {Blank single:19197::"yes","no"} Anhedonia: {Blank single:19197::"yes","no"} Significant weight loss or gain: {Blank single:19197::"yes","no"} Insomnia: {Blank single:19197::"yes","no"} {Blank single:19197::"hard to fall asleep","hard to stay asleep"} Fatigue: {Blank single:19197::"yes","no"} Feelings of worthlessness or guilt: {Blank single:19197::"yes","no"} Impaired concentration/indecisiveness: {Blank single:19197::"yes","no"} Suicidal ideations: {Blank single:19197::"yes","no"} Hopelessness: {Blank single:19197::"yes","no"} Crying spells: {Blank single:19197::"yes","no"} Depression screen PHQ 2/9 12/05/2016  Decreased Interest 0  Down, Depressed, Hopeless 0  PHQ - 2 Score 0    Relevant past medical, surgical, family and social history reviewed and updated as indicated. Interim medical history since our last visit reviewed. Allergies and medications reviewed and updated.  Review of Systems  Per HPI unless specifically indicated above     Objective:    There were no vitals taken for this visit.  Wt Readings from Last 3 Encounters:  12/05/16 144 lb 12.8 oz (65.7 kg)    Physical Exam  Results for orders placed or performed in visit on 12/05/16  CBC and differential  Result Value Ref Range   Hemoglobin 12.7 12.0 - 16.0 g/dL   HCT 38 36 - 46 %   Platelets 262 150 - 399 K/L   WBC 9.8 88^5/OY  Basic  metabolic panel  Result Value Ref Range   Glucose 89 mg/dL   BUN 15 4 - 21 mg/dL   Creatinine 0.7 0.5 - 1.1 mg/dL   Potassium 3.9 3.4 - 5.3 mmol/L   Sodium 139 137 - 147 mmol/L  Lipid panel  Result Value Ref Range   Triglycerides 102 40 - 160 mg/dL   Cholesterol 222 (A) 0 - 200 mg/dL   HDL 60 35 - 70 mg/dL   LDL Cholesterol 142 mg/dL      Assessment & Plan:   Problem List Items Addressed This Visit      Other   Depression   Fibromyalgia   Hyperlipidemia   Insomnia    Other Visit Diagnoses    Routine general medical examination at a health care  facility    -  Primary       Follow up plan: No Follow-up on file.

## 2016-12-21 NOTE — Assessment & Plan Note (Signed)
Stable on ambien. Refill given today. Discussed risks and benefits. She is aware.

## 2016-12-21 NOTE — Progress Notes (Signed)
BP 125/79 (BP Location: Left Arm, Patient Position: Sitting, Cuff Size: Normal)   Pulse 72   Temp 98.6 F (37 C)   Ht 5' 4.1" (1.628 m)   Wt 144 lb 1 oz (65.3 kg)   SpO2 97%   BMI 24.65 kg/m    Subjective:    Patient ID: Kathryn Conway, female    DOB: [birth year], 66 y.o.   MRN: [id number]  HPI: Kathryn Conway is a 66 y.o. female presenting on 12/21/2016 for comprehensive medical examination. Current medical complaints include:  INSOMNIA- has only taken ambien for it. Tries to take [place] as needed, usually 4-5x a week Duration: chronic- since she was in her 32s Satisfied with sleep quality: no Difficulty falling asleep: yes Difficulty staying asleep: yes Waking a few hours after sleep onset: yes Early morning awakenings: yes Daytime hypersomnolence: yes Wakes feeling refreshed: no Good sleep hygiene: yes Apnea: no Snoring: no Depressed/anxious mood: no Recent stress: no Restless legs/nocturnal leg cramps: no Chronic pain/arthritis: no History of sleep study: no Treatments attempted: benadryl and ambien   Menopausal Symptoms: no  Depression Screen done today and results listed below:  Depression screen [facility] 2/9 12/05/2016  Decreased Interest 0  Down, Depressed, Hopeless 0  PHQ - 2 Score 0    Past Medical History:  Past Medical History:  Diagnosis Date  . Depression   . Fibromyalgia   . Hyperlipidemia   . Insomnia   . TMJ (temporomandibular joint disorder)   . Uterine fibroid     Surgical History:  Past Surgical History:  Procedure Laterality Date  . ABDOMINAL HYSTERECTOMY    . COLONOSCOPY  05/20/2012   repeat 5 years  . laparoscopic gyn procedure     done before her hysterectomy  . OOPHORECTOMY Left     Medications:  Current Outpatient Prescriptions on File Prior to Visit  Medication Sig  . aspirin EC 81 MG tablet Take 81 mg by mouth daily.  . citalopram (CELEXA) 40 MG tablet Take 1 tablet (40 mg total) by mouth daily.  Marland Kitchen gabapentin  (NEURONTIN) 300 MG capsule Take 3 capsules (900 mg total) by mouth 2 (two) times daily.  Marland Kitchen loratadine (CLARITIN) 10 MG tablet Take 10 mg by mouth daily.   No current facility-administered medications on file prior to visit.     Allergies:  Allergies  Allergen Reactions  . Sulfa Antibiotics Rash    Social History:  Social History   Social History  . Marital status: Single    Spouse name: N/A  . Number of children: N/A  . Years of education: N/A   Occupational History  . Not on file.   Social History Main Topics  . Smoking status: Never Smoker  . Smokeless tobacco: Never Used  . Alcohol use 1.8 oz/week    3 Glasses of wine per week  . Drug use: No  . Sexual activity: Yes    Birth control/ protection: Surgical   Other Topics Concern  . Not on file   Social History Narrative  . No narrative on file   History  Smoking Status  . Never Smoker  Smokeless Tobacco  . Never Used   History  Alcohol Use  . 1.8 oz/week  . 3 Glasses of wine per week    Family History:  Family History  Problem Relation Age of Onset  . Heart disease Mother   . Hypertension Mother   . Depression Mother   . Osteoporosis Mother   . Colon polyps  Mother   . Cirrhosis Mother        non alcoholic  . Liver disease Mother        fatty liver disease  . Heart disease Father   . Hypertension Father   . Colon polyps Father   . Stroke Father   . Heart disease Sister   . Hypertension Sister   . Colon polyps Sister   . Diabetes Sister   . Hypertension Daughter   . Colon polyps Daughter   . Heart attack Brother   . Hypertension Brother   . Heart attack Brother   . Hyperlipidemia Son     Past medical history, surgical history, medications, allergies, family history and social history reviewed with patient today and changes made to appropriate areas of the chart.   Review of Systems  Constitutional: Negative.   HENT: Negative.   Eyes: Negative.   Respiratory: Negative.     Cardiovascular: Negative.   Gastrointestinal: Negative.   Genitourinary: Negative.   Musculoskeletal: Positive for myalgias. Negative for back pain, falls, joint pain and neck pain.  Skin: Negative.   Neurological: Negative.   Endo/Heme/Allergies: Negative.   Psychiatric/Behavioral: Negative for depression, hallucinations, memory loss, substance abuse and suicidal ideas. The patient has insomnia. The patient is not nervous/anxious.     All other ROS negative except what is listed above and in the HPI.      Objective:    BP 125/79 (BP Location: Left Arm, Patient Position: Sitting, Cuff Size: Normal)   Pulse 72   Temp 98.6 F (37 C)   Ht 5' 4.1" (1.628 m)   Wt 144 lb 1 oz (65.3 kg)   SpO2 97%   BMI 24.65 kg/m   Wt Readings from Last 3 Encounters:  12/21/16 144 lb 1 oz (65.3 kg)  12/05/16 144 lb 12.8 oz (65.7 kg)    Physical Exam  Constitutional: She is oriented to person, place, and time. She appears well-developed and well-nourished. No distress.  HENT:  Head: Normocephalic and atraumatic.  Right Ear: Hearing, tympanic membrane, external ear and ear canal normal.  Left Ear: Hearing, tympanic membrane, external ear and ear canal normal.  Nose: Nose normal.  Mouth/Throat: Uvula is midline, oropharynx is clear and moist and mucous membranes are normal. No oropharyngeal exudate.  Eyes: Conjunctivae, EOM and lids are normal. Pupils are equal, round, and reactive to light. Right eye exhibits no discharge. Left eye exhibits no discharge. No scleral icterus.  Neck: Normal range of motion. Neck supple. No JVD present. No tracheal deviation present. No thyromegaly present.  Cardiovascular: Normal rate, regular rhythm, normal heart sounds and intact distal pulses.  Exam reveals no gallop and no friction rub.   No murmur heard. Pulmonary/Chest: Effort normal and breath sounds normal. No stridor. No respiratory distress. She has no wheezes. She has no rales. She exhibits no tenderness.  Right breast exhibits no inverted nipple, no mass, no nipple discharge, no skin change and no tenderness. Left breast exhibits no inverted nipple, no mass, no nipple discharge, no skin change and no tenderness. Breasts are symmetrical.  Abdominal: Soft. Bowel sounds are normal. She exhibits no distension and no mass. There is no tenderness. There is no rebound and no guarding.  Genitourinary:  Genitourinary Comments: Pelvic exam deferred with shared decision making.   Musculoskeletal: Normal range of motion. She exhibits no edema, tenderness or deformity.  Lymphadenopathy:    She has no cervical adenopathy.  Neurological: She is alert and oriented to person, place, and  time. She has normal reflexes. She displays normal reflexes. No cranial nerve deficit. She exhibits normal muscle tone. Coordination normal.  Skin: Skin is warm, dry and intact. No rash noted. She is not diaphoretic. No erythema. No pallor.  Psychiatric: She has a normal mood and affect. Her speech is normal and behavior is normal. Judgment and thought content normal. Cognition and memory are normal.  Nursing note and vitals reviewed.   Results for orders placed or performed in visit on 12/05/16  CBC and differential  Result Value Ref Range   Hemoglobin 12.7 12.0 - 16.0 g/dL   HCT 38 36 - 46 %   Platelets 262 150 - 399 K/L   WBC 9.8 02^7/OZ  Basic metabolic panel  Result Value Ref Range   Glucose 89 mg/dL   BUN 15 4 - 21 mg/dL   Creatinine 0.7 0.5 - 1.1 mg/dL   Potassium 3.9 3.4 - 5.3 mmol/L   Sodium 139 137 - 147 mmol/L  Lipid panel  Result Value Ref Range   Triglycerides 102 40 - 160 mg/dL   Cholesterol 222 (A) 0 - 200 mg/dL   HDL 60 35 - 70 mg/dL   LDL Cholesterol 142 mg/dL      Assessment & Plan:   Problem List Items Addressed This Visit      Other   Insomnia    Stable on ambien. Refill given today. Discussed risks and benefits. She is aware.        Other Visit Diagnoses    Routine general medical  examination at a health care facility    -  Primary   Vaccines declined. Pap up to date. Screening labs checked last visit. Mammogram through free mammogram program. Call with any concerns.        Follow up plan: Return in about 6 months (around 06/22/2017) for Follow up insomnia/cholesterol/fibro.   LABORATORY TESTING:  - Pap smear: not applicable  IMMUNIZATIONS:   - Tdap: Tetanus vaccination status reviewed: Declined. - Influenza: Refused - Pneumovax: Refused  SCREENING: -Mammogram: Refused  - Colonoscopy: Refused  - Bone Density: Refused   PATIENT COUNSELING:   Advised to take 1 mg of folate supplement per day if capable of pregnancy.   Sexuality: Discussed sexually transmitted diseases, partner selection, use of condoms, avoidance of unintended pregnancy  and contraceptive alternatives.   Advised to avoid cigarette smoking.  I discussed with the patient that most people either abstain from alcohol or drink within safe limits (<=14/week and <=4 drinks/occasion for males, <=7/weeks and <= 3 drinks/occasion for females) and that the risk for alcohol disorders and other health effects rises proportionally with the number of drinks per week and how often a drinker exceeds daily limits.  Discussed cessation/primary prevention of drug use and availability of treatment for abuse.   Diet: Encouraged to adjust caloric intake to maintain  or achieve ideal body weight, to reduce intake of dietary saturated fat and total fat, to limit sodium intake by avoiding high sodium foods and not adding table salt, and to maintain adequate dietary potassium and calcium preferably from fresh fruits, vegetables, and low-fat dairy products.    stressed the importance of regular exercise  Injury prevention: Discussed safety belts, safety helmets, smoke detector, smoking near bedding or upholstery.   Dental health: Discussed importance of regular tooth brushing, flossing, and dental visits.    NEXT  PREVENTATIVE PHYSICAL DUE IN 1 YEAR. Return in about 6 months (around 06/22/2017) for Follow up insomnia/cholesterol/fibro.

## 2017-06-22 ENCOUNTER — Ambulatory Visit: Payer: Self-pay | Admitting: Family Medicine

## 2017-06-22 ENCOUNTER — Encounter: Payer: Self-pay | Admitting: Family Medicine

## 2017-06-22 ENCOUNTER — Ambulatory Visit (INDEPENDENT_AMBULATORY_CARE_PROVIDER_SITE_OTHER): Payer: Self-pay | Admitting: Family Medicine

## 2017-06-22 VITALS — BP 148/79 | HR 81 | Temp 98.1°F | Wt 151.6 lb

## 2017-06-22 DIAGNOSIS — M797 Fibromyalgia: Secondary | ICD-10-CM

## 2017-06-22 DIAGNOSIS — F5101 Primary insomnia: Secondary | ICD-10-CM

## 2017-06-22 DIAGNOSIS — F3342 Major depressive disorder, recurrent, in full remission: Secondary | ICD-10-CM

## 2017-06-22 DIAGNOSIS — E782 Mixed hyperlipidemia: Secondary | ICD-10-CM

## 2017-06-22 MED ORDER — DULOXETINE HCL 40 MG PO CPEP: 40.0000 mg | ORAL_CAPSULE | Freq: Every day | ORAL | 1 refills | Status: DC

## 2017-06-22 MED ORDER — CITALOPRAM HYDROBROMIDE 40 MG PO TABS: 40.0000 mg | ORAL_TABLET | Freq: Every day | ORAL | 1 refills | Status: DC

## 2017-06-22 MED ORDER — ZOLPIDEM TARTRATE 10 MG PO TABS: 5.0000 mg | ORAL_TABLET | Freq: Every evening | ORAL | 5 refills | Status: DC | PRN

## 2017-06-22 MED ORDER — GABAPENTIN 300 MG PO CAPS: 900.0000 mg | ORAL_CAPSULE | Freq: Two times a day (BID) | ORAL | 1 refills | Status: DC

## 2017-06-22 NOTE — Progress Notes (Signed)
BP (!) 148/79 (BP Location: Left Arm, Patient Position: Sitting, Cuff Size: Normal)   Pulse 81   Temp 98.1 F (36.7 C)   Wt 151 lb 9 oz (68.7 kg)   SpO2 97%   BMI 25.93 kg/m    Subjective:    Patient ID: Kathryn Conway, female    DOB: September 30, 1950, 66 y.o.   MRN: [id number]  HPI: Kathryn Conway is a 66 y.o. female  Chief Complaint  Patient presents with  . Hyperlipidemia  . Fibromyalgia  . Insomnia   HYPERLIPIDEMIA Hyperlipidemia status: Stable Satisfied with current treatment?  yes Side effects:  Not on anything Aspirin:  no The 10-year ASCVD risk score [name] DC Jr., et al., 2013) is: 6.7%   Values used to calculate the score:     Age: 31 years     Sex: Female     Is Non-Hispanic African American: No     Diabetic: No     Tobacco smoker: No     Systolic Blood Pressure: 353 mmHg     Is BP treated: No     HDL Cholesterol: 60 mg/dL     Total Cholesterol: 222 mg/dL Chest pain:  no Coronary artery disease:  no  INSOMNIA Duration: chronic Satisfied with sleep quality: yes Difficulty falling asleep: no Difficulty staying asleep: yes Waking a few hours after sleep onset: yes Early morning awakenings: no Daytime hypersomnolence: no Wakes feeling refreshed: no Good sleep hygiene: yes Apnea: no Snoring: no Depressed/anxious mood: yes Recent stress: yes Restless legs/nocturnal leg cramps: no Chronic pain/arthritis: no History of sleep study: no Treatments attempted: melatonin, uinsom, benadryl and ambien   FIBROMYALGIA Pain status: exacerbated Satisfied with current treatment?: no Medication side effects: no Medication compliance: excellent compliance Duration: chronic Location: widespread Quality: aching Current pain level: mild Previous pain level: mild Previous pain specialty evaluation: no Non-narcotic analgesic meds: yes Narcotic contract:no  Relevant past medical, surgical, family and social history reviewed and updated as indicated. Interim  medical history since our last visit reviewed. Allergies and medications reviewed and updated.  Review of Systems  Constitutional: Negative.   Respiratory: Negative.   Cardiovascular: Negative.   Musculoskeletal: Positive for back pain and myalgias. Negative for arthralgias, gait problem, joint swelling, neck pain and neck stiffness.  Skin: Negative.   Psychiatric/Behavioral: Negative.     Per HPI unless specifically indicated above     Objective:    BP (!) 148/79 (BP Location: Left Arm, Patient Position: Sitting, Cuff Size: Normal)   Pulse 81   Temp 98.1 F (36.7 C)   Wt 151 lb 9 oz (68.7 kg)   SpO2 97%   BMI 25.93 kg/m   Wt Readings from Last 3 Encounters:  06/22/17 151 lb 9 oz (68.7 kg)  12/21/16 144 lb 1 oz (65.3 kg)  12/05/16 144 lb 12.8 oz (65.7 kg)    Physical Exam  Constitutional: She is oriented to person, place, and time. She appears well-developed and well-nourished. No distress.  HENT:  Head: Normocephalic and atraumatic.  Right Ear: Hearing normal.  Left Ear: Hearing normal.  Nose: Nose normal.  Eyes: Conjunctivae and lids are normal. Right eye exhibits no discharge. Left eye exhibits no discharge. No scleral icterus.  Cardiovascular: Normal rate, regular rhythm, normal heart sounds and intact distal pulses. Exam reveals no gallop and no friction rub.  No murmur heard. Pulmonary/Chest: Effort normal and breath sounds normal. No respiratory distress. She has no wheezes. She has no rales. She exhibits no tenderness.  Musculoskeletal: Normal range of motion.  Neurological: She is alert and oriented to person, place, and time.  Skin: Skin is warm, dry and intact. No rash noted. She is not diaphoretic. No erythema. No pallor.  Psychiatric: She has a normal mood and affect. Her speech is normal and behavior is normal. Judgment and thought content normal. Cognition and memory are normal.  Nursing note and vitals reviewed.   Results for orders placed or performed  in visit on 12/05/16  CBC and differential  Result Value Ref Range   Hemoglobin 12.7 12.0 - 16.0 g/dL   HCT 38 36 - 46 %   Platelets 262 150 - 399 K/L   WBC 9.8 49^6/PR  Basic metabolic panel  Result Value Ref Range   Glucose 89 mg/dL   BUN 15 4 - 21 mg/dL   Creatinine 0.7 0.5 - 1.1 mg/dL   Potassium 3.9 3.4 - 5.3 mmol/L   Sodium 139 137 - 147 mmol/L  Lipid panel  Result Value Ref Range   Triglycerides 102 40 - 160 mg/dL   Cholesterol 222 (A) 0 - 200 mg/dL   HDL 60 35 - 70 mg/dL   LDL Cholesterol 142 mg/dL      Assessment & Plan:   Problem List Items Addressed This Visit      Other   Depression    Will change her to cymbalta if cost allows to help with pain. Continue gabapentin. Call with any concerns.       Relevant Medications   DULoxetine HCl 40 MG CPEP   citalopram (CELEXA) 40 MG tablet   Fibromyalgia    Will change her to cymbalta if cost allows to help with pain. Continue gabapentin. Call with any concerns.       Hyperlipidemia    Under good control on current regimen. Continue diet and exercise. Call with any concerns.       Insomnia - Primary    Under good control on current regimen. Continue current regimen. Call with any concerns.           Follow up plan: Return in about 6 months (around 12/21/2017) for Follow up.

## 2017-06-22 NOTE — Assessment & Plan Note (Signed)
Under good control on current regimen. Continue current regimen. Call with any concerns.

## 2017-06-22 NOTE — Assessment & Plan Note (Signed)
Will change her to cymbalta if cost allows to help with pain. Continue gabapentin. Call with any concerns.

## 2017-06-22 NOTE — Assessment & Plan Note (Addendum)
Under good control on current regimen. Continue diet and exercise. Call with any concerns.

## 2017-12-17 ENCOUNTER — Other Ambulatory Visit: Payer: Self-pay

## 2017-12-17 MED ORDER — CITALOPRAM HYDROBROMIDE 40 MG PO TABS: 40.0000 mg | ORAL_TABLET | Freq: Every day | ORAL | 1 refills | Status: DC

## 2017-12-25 ENCOUNTER — Encounter: Payer: Self-pay | Admitting: Family Medicine

## 2017-12-25 ENCOUNTER — Ambulatory Visit (INDEPENDENT_AMBULATORY_CARE_PROVIDER_SITE_OTHER): Payer: Self-pay | Admitting: Family Medicine

## 2017-12-25 VITALS — BP 155/67 | HR 78 | Ht 64.0 in | Wt 147.0 lb

## 2017-12-25 DIAGNOSIS — M797 Fibromyalgia: Secondary | ICD-10-CM

## 2017-12-25 DIAGNOSIS — E7800 Pure hypercholesterolemia, unspecified: Secondary | ICD-10-CM

## 2017-12-25 DIAGNOSIS — F3342 Major depressive disorder, recurrent, in full remission: Secondary | ICD-10-CM

## 2017-12-25 DIAGNOSIS — F5101 Primary insomnia: Secondary | ICD-10-CM

## 2017-12-25 MED ORDER — CITALOPRAM HYDROBROMIDE 40 MG PO TABS: 40.0000 mg | ORAL_TABLET | Freq: Every day | ORAL | 1 refills | Status: DC

## 2017-12-25 MED ORDER — GABAPENTIN 300 MG PO CAPS: 900.0000 mg | ORAL_CAPSULE | Freq: Two times a day (BID) | ORAL | 1 refills | Status: DC

## 2017-12-25 MED ORDER — ZOLPIDEM TARTRATE 10 MG PO TABS: 5.0000 mg | ORAL_TABLET | Freq: Every evening | ORAL | 5 refills | Status: DC | PRN

## 2017-12-25 NOTE — Progress Notes (Signed)
BP (!) 155/67   Pulse 78   Ht 5\' 4"  (1.626 m)   Wt 147 lb (66.7 kg)   SpO2 97%   BMI 25.23 kg/m    Subjective:    Patient ID: Kathryn Conway, female    DOB: May 10, 1951, 67 y.o.   MRN: [id number]  HPI: Kathryn Conway is a 67 y.o. female  Chief Complaint  Patient presents with  . Follow-up  . Depression   DEPRESSION Mood status: controlled Satisfied with current treatment?: yes Symptom severity: mild  Duration of current treatment : chronic Side effects: no Medication compliance: excellent compliance Psychotherapy/counseling: no  Previous psychiatric medications: celexa Depressed mood: no Anxious mood: yes Anhedonia: no Significant weight loss or gain: no Insomnia: yes hard to fall asleep Fatigue: yes Feelings of worthlessness or guilt: no Impaired concentration/indecisiveness: no Suicidal ideations: no Hopelessness: no Crying spells: no Depression screen [facility] 2/9 12/25/2017 06/22/2017 06/22/2017 12/05/2016  Decreased Interest 0 0 0 0  Down, Depressed, Hopeless 0 2 0 0  PHQ - 2 Score 0 2 0 0  Altered sleeping 2 2 - -  Tired, decreased energy 2 1 - -  Change in appetite 0 0 - -  Feeling bad or failure about yourself  0 0 - -  Trouble concentrating 0 0 - -  Moving slowly or fidgety/restless 0 0 - -  Suicidal thoughts 0 0 - -  PHQ-9 Score 4 5 - -  Difficult doing work/chores Not difficult at all - - -   FIBROMYALGIA Pain status: stable Satisfied with current treatment?: yes Medication side effects: no Medication compliance: excellent compliance Duration: chronic Location: widespread Quality: aching and sore Current pain level: mild Previous pain level: mild Aggravating factors: weather Alleviating factors: medicine Previous pain specialty evaluation: no Non-narcotic analgesic meds: yes Narcotic contract:no  INSOMNIA Duration: chronic Satisfied with sleep quality: yes Difficulty falling asleep: no Difficulty staying asleep: no Waking a few  hours after sleep onset: no Early morning awakenings: no Daytime hypersomnolence: no Wakes feeling refreshed: yes Good sleep hygiene: yes Apnea: no Snoring: no Depressed/anxious mood: yes Recent stress: yes Restless legs/nocturnal leg cramps: no Chronic pain/arthritis: no History of sleep study: no Treatments attempted: melatonin, uinsom, benadryl and ambien    Relevant past medical, surgical, family and social history reviewed and updated as indicated. Interim medical history since our last visit reviewed. Allergies and medications reviewed and updated.  Review of Systems  Constitutional: Negative.   Respiratory: Negative.   Cardiovascular: Negative.   Musculoskeletal: Positive for myalgias. Negative for arthralgias, back pain, gait problem, joint swelling and neck pain.  Neurological: Negative.   Psychiatric/Behavioral: Negative.     Per HPI unless specifically indicated above     Objective:    BP (!) 155/67   Pulse 78   Ht 5\' 4"  (1.626 m)   Wt 147 lb (66.7 kg)   SpO2 97%   BMI 25.23 kg/m   Wt Readings from Last 3 Encounters:  12/25/17 147 lb (66.7 kg)  06/22/17 151 lb 9 oz (68.7 kg)  12/21/16 144 lb 1 oz (65.3 kg)    Physical Exam  Constitutional: She is oriented to person, place, and time. She appears well-developed and well-nourished. No distress.  HENT:  Head: Normocephalic and atraumatic.  Right Ear: Hearing normal.  Left Ear: Hearing normal.  Nose: Nose normal.  Eyes: Conjunctivae and lids are normal. Right eye exhibits no discharge. Left eye exhibits no discharge. No scleral icterus.  Cardiovascular: Normal rate, regular rhythm,  normal heart sounds and intact distal pulses. Exam reveals no gallop and no friction rub.  No murmur heard. Pulmonary/Chest: Effort normal and breath sounds normal. No stridor. No respiratory distress. She has no wheezes. She has no rales. She exhibits no tenderness.  Musculoskeletal: Normal range of motion.  Neurological: She  is alert and oriented to person, place, and time.  Skin: Skin is warm, dry and intact. Capillary refill takes less than 2 seconds. No rash noted. She is not diaphoretic. No erythema. No pallor.  Psychiatric: She has a normal mood and affect. Her speech is normal and behavior is normal. Judgment and thought content normal. Cognition and memory are normal.  Nursing note and vitals reviewed.   Results for orders placed or performed in visit on 12/05/16  CBC and differential  Result Value Ref Range   Hemoglobin 12.7 12.0 - 16.0 g/dL   HCT 38 36 - 46 %   Platelets 262 150 - 399 K/L   WBC 9.8 73^4/LP  Basic metabolic panel  Result Value Ref Range   Glucose 89 mg/dL   BUN 15 4 - 21 mg/dL   Creatinine 0.7 0.5 - 1.1 mg/dL   Potassium 3.9 3.4 - 5.3 mmol/L   Sodium 139 137 - 147 mmol/L  Lipid panel  Result Value Ref Range   Triglycerides 102 40 - 160 mg/dL   Cholesterol 222 (A) 0 - 200 mg/dL   HDL 60 35 - 70 mg/dL   LDL Cholesterol 142 mg/dL      Assessment & Plan:   Problem List Items Addressed This Visit      Other   Fibromyalgia    Stable on current regimen. Continue current regimen. Continue to monitor. Call with any concerns. Refills given.       Hypercholesterolemia    Will recheck labs when she has insurance in 3 months.       Insomnia    Stable on current regimen. Continue current regimen. Continue to monitor. Call with any concerns. Refills given.       Recurrent major depressive disorder, in full remission ([place]) - Primary    Stable on current regimen. Continue current regimen. Continue to monitor. Call with any concerns. Refills given.       Relevant Medications   citalopram (CELEXA) 40 MG tablet       Follow up plan: Return in about 3 months (around 03/27/2018) for Welcome to medicare.

## 2017-12-25 NOTE — Assessment & Plan Note (Signed)
Stable on current regimen. Continue current regimen. Continue to monitor. Call with any concerns. Refills given.

## 2017-12-25 NOTE — Assessment & Plan Note (Signed)
Will recheck labs when she has insurance in 3 months.

## 2018-03-05 DIAGNOSIS — H2513 Age-related nuclear cataract, bilateral: Secondary | ICD-10-CM | POA: Diagnosis not present

## 2018-03-06 ENCOUNTER — Other Ambulatory Visit: Payer: Self-pay | Admitting: Family Medicine

## 2018-03-06 NOTE — Telephone Encounter (Signed)
Tried to call pt, no answer. Unable to leave VM as it says "voicemail box full". Will try to call pt later.

## 2018-03-06 NOTE — Telephone Encounter (Signed)
Called pt. Pt stated that the call center misunderstood about what she wast asking for. Pt states that she knows that she has a 6 month Rx for gabapentin. Pt states her insurance [employer] needs prior authorization as this medication is not on their preference list.

## 2018-03-06 NOTE — Telephone Encounter (Signed)
Pt is returning call.   Please call back at [phone]

## 2018-03-06 NOTE — Telephone Encounter (Signed)
6 month supply given in June. Not due for refill until November

## 2018-03-06 NOTE — Telephone Encounter (Signed)
Refill of neurontin  LOV 12/25/17 Dr. Wynetta Emery  [facility] 12/25/17  #[id number]  1 refill  [id number] - [place], [place] - [address] [phone] (Phone)

## 2018-03-06 NOTE — Telephone Encounter (Signed)
Copied from [place] [id number]. Topic: Quick Communication - Rx Refill/Question >> Mar 06, 2018 10:02 AM Percell Belt A wrote: Medication: gabapentin (NEURONTIN) 300 MG capsule [[id number]]  Has the patient contacted their pharmacy? Yes  (Agent: If no, request that the patient contact the pharmacy for the refill.) (Agent: If yes, when and what did the pharmacy advise?)  Preferred Pharmacy (with phone number or street name): [id number] - [place], [address] [phone] (Phone)   Agent: Please be advised that RX refills may take up to 3 business days. We ask that you follow-up with your pharmacy.

## 2018-03-12 NOTE — Telephone Encounter (Signed)
PA initiated for Gabapentin. Key: [id number]  Routing to Dr. Durenda Age CMA as Juluis Rainier

## 2018-03-12 NOTE — Telephone Encounter (Signed)
Spoke with patient because I needed her supplemental insurance information  ID: [place]: [id number] PCN: [id number] Group: [place] [employer] P[employer] Supplemental  Patient stated she'd need PA on all 3 of her medications Gabapentin, Celexa, Ambien

## 2018-03-18 NOTE — Telephone Encounter (Signed)
PA approved.

## 2018-04-02 ENCOUNTER — Encounter: Payer: Self-pay | Admitting: Family Medicine

## 2018-04-02 ENCOUNTER — Telehealth: Payer: Self-pay | Admitting: Family Medicine

## 2018-04-02 ENCOUNTER — Ambulatory Visit (INDEPENDENT_AMBULATORY_CARE_PROVIDER_SITE_OTHER): Payer: Medicare HMO | Admitting: Family Medicine

## 2018-04-02 VITALS — BP 136/77 | HR 79 | Ht 63.0 in | Wt 132.0 lb

## 2018-04-02 DIAGNOSIS — Z131 Encounter for screening for diabetes mellitus: Secondary | ICD-10-CM | POA: Diagnosis not present

## 2018-04-02 DIAGNOSIS — E7800 Pure hypercholesterolemia, unspecified: Secondary | ICD-10-CM | POA: Diagnosis not present

## 2018-04-02 DIAGNOSIS — Z780 Asymptomatic menopausal state: Secondary | ICD-10-CM

## 2018-04-02 DIAGNOSIS — Z0000 Encounter for general adult medical examination without abnormal findings: Secondary | ICD-10-CM | POA: Diagnosis not present

## 2018-04-02 DIAGNOSIS — N941 Unspecified dyspareunia: Secondary | ICD-10-CM | POA: Diagnosis not present

## 2018-04-02 DIAGNOSIS — Z1159 Encounter for screening for other viral diseases: Secondary | ICD-10-CM | POA: Diagnosis not present

## 2018-04-02 DIAGNOSIS — F3342 Major depressive disorder, recurrent, in full remission: Secondary | ICD-10-CM | POA: Diagnosis not present

## 2018-04-02 DIAGNOSIS — Z8249 Family history of ischemic heart disease and other diseases of the circulatory system: Secondary | ICD-10-CM

## 2018-04-02 DIAGNOSIS — Z7189 Other specified counseling: Secondary | ICD-10-CM

## 2018-04-02 DIAGNOSIS — R7309 Other abnormal glucose: Secondary | ICD-10-CM | POA: Diagnosis not present

## 2018-04-02 DIAGNOSIS — Z1239 Encounter for other screening for malignant neoplasm of breast: Secondary | ICD-10-CM

## 2018-04-02 DIAGNOSIS — Z114 Encounter for screening for human immunodeficiency virus [HIV]: Secondary | ICD-10-CM | POA: Diagnosis not present

## 2018-04-02 DIAGNOSIS — R69 Illness, unspecified: Secondary | ICD-10-CM | POA: Diagnosis not present

## 2018-04-02 DIAGNOSIS — Z1211 Encounter for screening for malignant neoplasm of colon: Secondary | ICD-10-CM

## 2018-04-02 MED ORDER — CITALOPRAM HYDROBROMIDE 40 MG PO TABS: 40.0000 mg | ORAL_TABLET | Freq: Every day | ORAL | 1 refills | Status: DC

## 2018-04-02 MED ORDER — GABAPENTIN 300 MG PO CAPS: 900.0000 mg | ORAL_CAPSULE | Freq: Two times a day (BID) | ORAL | 1 refills | Status: DC

## 2018-04-02 NOTE — Progress Notes (Signed)
BP 136/77   Pulse 79   Ht 5\' 3"  (1.6 m)   Wt 132 lb (59.9 kg)   SpO2 97%   BMI 23.38 kg/m    Subjective:    Patient ID: Kathryn Conway, female    DOB: 24-Jan-1951, 67 y.o.   MRN: [id number]  HPI: Kathryn Conway is a 67 y.o. female presenting on 04/02/2018 for comprehensive medical examination. Current medical complaints include: low back pain  HYPERLIPIDEMIA Hyperlipidemia status: stable Satisfied with current treatment?  yes Side effects:  no Medication compliance: Not on anything Supplements: none Aspirin:  no The 10-year ASCVD risk score [name] DC Jr., et al., 2013) is: 6.4%   Values used to calculate the score:     Age: 65 years     Sex: Female     Is Non-Hispanic African American: No     Diabetic: No     Tobacco smoker: No     Systolic Blood Pressure: 683 mmHg     Is BP treated: No     HDL Cholesterol: 60 mg/dL     Total Cholesterol: 222 mg/dL Chest pain:  no Coronary artery disease:  no  DEPRESSION Mood status: controlled Satisfied with current treatment?: yes Symptom severity: mild  Duration of current treatment : chronic Side effects: no Medication compliance: excellent compliance Psychotherapy/counseling: no  Previous psychiatric medications: celexa Depressed mood: no Anxious mood: no Anhedonia: no Significant weight loss or gain: no Insomnia: not when on medicine  Fatigue: no Feelings of worthlessness or guilt: no Impaired concentration/indecisiveness: no Suicidal ideations: no Hopelessness: no Crying spells: no Depression screen [facility] 2/9 04/02/2018 12/25/2017 06/22/2017 06/22/2017 12/05/2016  Decreased Interest 0 0 0 0 0  Down, Depressed, Hopeless 0 0 2 0 0  PHQ - 2 Score 0 0 2 0 0  Altered sleeping 3 2 2  - -  Tired, decreased energy 2 2 1  - -  Change in appetite 0 0 0 - -  Feeling bad or failure about yourself  0 0 0 - -  Trouble concentrating 0 0 0 - -  Moving slowly or fidgety/restless 0 0 0 - -  Suicidal thoughts 0 0 0 - -  PHQ-9  Score 5 4 5  - -  Difficult doing work/chores Not difficult at all Not difficult at all - - -    She currently lives with: husband Menopausal Symptoms: no  Functional Status Survey: Is the patient deaf or have difficulty hearing?: No Does the patient have difficulty seeing, even when wearing glasses/contacts?: Yes Does the patient have difficulty concentrating, remembering, or making decisions?: Yes Does the patient have difficulty walking or climbing stairs?: No Does the patient have difficulty dressing or bathing?: No Does the patient have difficulty doing errands alone such as visiting a doctor's office or shopping?: No  Fall Risk  04/02/2018 04/02/2018 12/25/2017 12/25/2017  Falls in the past year? No No No No   Depression Screen Depression screen [facility] 2/9 04/02/2018 12/25/2017 06/22/2017 06/22/2017 12/05/2016  Decreased Interest 0 0 0 0 0  Down, Depressed, Hopeless 0 0 2 0 0  PHQ - 2 Score 0 0 2 0 0  Altered sleeping 3 2 2  - -  Tired, decreased energy 2 2 1  - -  Change in appetite 0 0 0 - -  Feeling bad or failure about yourself  0 0 0 - -  Trouble concentrating 0 0 0 - -  Moving slowly or fidgety/restless 0 0 0 - -  Suicidal thoughts 0  0 0 - -  PHQ-9 Score 5 4 5  - -  Difficult doing work/chores Not difficult at all Not difficult at all - - -   Advanced Directives Does patient have a HCPOA?    no Does patient have a living will or MOST form?  no  Past Medical History:  Past Medical History:  Diagnosis Date  . Depression   . Fibromyalgia   . Hyperlipidemia   . Insomnia   . TMJ (temporomandibular joint disorder)   . Uterine fibroid     Surgical History:  Past Surgical History:  Procedure Laterality Date  . ABDOMINAL HYSTERECTOMY    . COLONOSCOPY  05/20/2012   repeat 5 years  . laparoscopic gyn procedure     done before her hysterectomy  . OOPHORECTOMY Left     Medications:  Current Outpatient Medications on File Prior to Visit  Medication Sig  . aspirin EC 81 MG  tablet Take 81 mg by mouth daily.  Marland Kitchen loratadine (CLARITIN) 10 MG tablet Take 10 mg by mouth daily.  Marland Kitchen zolpidem (AMBIEN) 10 MG tablet Take 0.5 tablets (5 mg total) by mouth at bedtime as needed.   No current facility-administered medications on file prior to visit.     Allergies:  Allergies  Allergen Reactions  . Sulfa Antibiotics Rash    Other reaction(s): Unknown Other reaction(s): Unknown     Social History:  Social History   Socioeconomic History  . Marital status: Single    Spouse name: Not on file  . Number of children: Not on file  . Years of education: Not on file  . Highest education level: Not on file  Occupational History  . Not on file  Social Needs  . Financial resource strain: Not on file  . Food insecurity:    Worry: Not on file    Inability: Not on file  . Transportation needs:    Medical: Not on file    Non-medical: Not on file  Tobacco Use  . Smoking status: Never Smoker  . Smokeless tobacco: Never Used  Substance and Sexual Activity  . Alcohol use: Yes    Alcohol/week: 3.0 standard drinks    Types: 3 Glasses of wine per week  . Drug use: No  . Sexual activity: Yes    Birth control/protection: Surgical  Lifestyle  . Physical activity:    Days per week: Not on file    Minutes per session: Not on file  . Stress: Not on file  Relationships  . Social connections:    Talks on phone: Not on file    Gets together: Not on file    Attends religious service: Not on file    Active member of club or organization: Not on file    Attends meetings of clubs or organizations: Not on file    Relationship status: Not on file  . Intimate partner violence:    Fear of current or ex partner: Not on file    Emotionally abused: Not on file    Physically abused: Not on file    Forced sexual activity: Not on file  Other Topics Concern  . Not on file  Social History Narrative  . Not on file   Social History   Tobacco Use  Smoking Status Never Smoker    Smokeless Tobacco Never Used   Social History   Substance and Sexual Activity  Alcohol Use Yes  . Alcohol/week: 3.0 standard drinks  . Types: 3 Glasses of wine per week  Family History:  Family History  Problem Relation Age of Onset  . Heart disease Mother   . Hypertension Mother   . Depression Mother   . Osteoporosis Mother   . Colon polyps Mother   . Cirrhosis Mother        non alcoholic  . Liver disease Mother        fatty liver disease  . Heart disease Father   . Hypertension Father   . Colon polyps Father   . Stroke Father   . Heart disease Sister   . Hypertension Sister   . Colon polyps Sister   . Diabetes Sister   . Hypertension Daughter   . Colon polyps Daughter   . Heart attack Brother   . Hypertension Brother   . Heart attack Brother   . Hyperlipidemia Son     Past medical history, surgical history, medications, allergies, family history and social history reviewed with patient today and changes made to appropriate areas of the chart.   Review of Systems  Constitutional: Negative.   HENT: Negative.   Eyes: Positive for blurred vision (working with eye doctor). Negative for double vision, photophobia, pain, discharge and redness.  Respiratory: Negative.   Cardiovascular: Negative.   Gastrointestinal: Negative.   Genitourinary: Negative.        Dysparunia  Musculoskeletal: Positive for back pain. Negative for falls, joint pain, myalgias and neck pain.  Skin: Negative.   Neurological: Negative.   Endo/Heme/Allergies: Positive for environmental allergies. Negative for polydipsia. Does not bruise/bleed easily.  Psychiatric/Behavioral: Negative.    All other ROS negative except what is listed above and in the HPI.      Objective:    BP 136/77   Pulse 79   Ht 5\' 3"  (1.6 m)   Wt 132 lb (59.9 kg)   SpO2 97%   BMI 23.38 kg/m   Wt Readings from Last 3 Encounters:  04/02/18 132 lb (59.9 kg)  12/25/17 147 lb (66.7 kg)  06/22/17 151 lb 9 oz (68.7  kg)     Hearing Screening   Method: Audiometry   125Hz  250Hz  500Hz  1000Hz  2000Hz  3000Hz  4000Hz  6000Hz  8000Hz   Right ear:   40 40 40 40 40    Left ear:   40 40 40 40 40      Visual Acuity Screening   Right eye Left eye Both eyes  Without correction:     With correction: 20/50 20/30 20/30   Comments: Pt has been having difficulty w/ contacts.   Physical Exam  Constitutional: She is oriented to person, place, and time. She appears well-developed and well-nourished. No distress.  HENT:  Head: Normocephalic and atraumatic.  Right Ear: Hearing, tympanic membrane, external ear and ear canal normal.  Left Ear: Hearing, tympanic membrane, external ear and ear canal normal.  Nose: Nose normal.  Mouth/Throat: Uvula is midline, oropharynx is clear and moist and mucous membranes are normal. No oropharyngeal exudate.  Eyes: Pupils are equal, round, and reactive to light. Conjunctivae, EOM and lids are normal. Right eye exhibits no discharge. Left eye exhibits no discharge. No scleral icterus.  Neck: Normal range of motion. Neck supple. No JVD present. No tracheal deviation present. No thyromegaly present.  Cardiovascular: Normal rate, regular rhythm, normal heart sounds and intact distal pulses. Exam reveals no gallop and no friction rub.  No murmur heard. Pulmonary/Chest: Effort normal and breath sounds normal. No stridor. No respiratory distress. She has no wheezes. She has no rales. She exhibits no tenderness. Right breast  exhibits no inverted nipple, no mass, no nipple discharge, no skin change and no tenderness. Left breast exhibits no inverted nipple, no mass, no nipple discharge, no skin change and no tenderness. No breast swelling, tenderness, discharge or bleeding. Breasts are symmetrical.  Abdominal: Soft. Bowel sounds are normal. She exhibits no distension and no mass. There is no tenderness. There is no rebound and no guarding. No hernia. Hernia confirmed negative in the right inguinal area  and confirmed negative in the left inguinal area.  Genitourinary: Vagina normal. No labial fusion. There is no rash, tenderness, lesion or injury on the right labia. There is no rash, tenderness, lesion or injury on the left labia. No erythema, tenderness or bleeding in the vagina. No foreign body in the vagina. No signs of injury around the vagina. No vaginal discharge found.  Musculoskeletal: Normal range of motion. She exhibits no edema or deformity.  Lymphadenopathy:    She has no cervical adenopathy.  Neurological: She is alert and oriented to person, place, and time. She displays normal reflexes. No cranial nerve deficit or sensory deficit. She exhibits normal muscle tone. Coordination normal.  Skin: Skin is warm, dry and intact. Capillary refill takes less than 2 seconds. No rash noted. She is not diaphoretic. No erythema. No pallor.  Psychiatric: She has a normal mood and affect. Her speech is normal and behavior is normal. Judgment and thought content normal. Cognition and memory are normal.  Nursing note and vitals reviewed.   6CIT Screen 04/02/2018  What Year? 0 points  What month? 0 points  What time? 0 points  Count back from 20 0 points  Months in reverse 0 points  Repeat phrase 0 points  Total Score 0     Results for orders placed or performed in visit on 12/05/16  CBC and differential  Result Value Ref Range   Hemoglobin 12.7 12.0 - 16.0 g/dL   HCT 38 36 - 46 %   Platelets 262 150 - 399 K/L   WBC 9.8 14^7/WG  Basic metabolic panel  Result Value Ref Range   Glucose 89 mg/dL   BUN 15 4 - 21 mg/dL   Creatinine 0.7 0.5 - 1.1 mg/dL   Potassium 3.9 3.4 - 5.3 mmol/L   Sodium 139 137 - 147 mmol/L  Lipid panel  Result Value Ref Range   Triglycerides 102 40 - 160 mg/dL   Cholesterol 222 (A) 0 - 200 mg/dL   HDL 60 35 - 70 mg/dL   LDL Cholesterol 142 mg/dL      Assessment & Plan:   Problem List Items Addressed This Visit      Other   Hypercholesterolemia     Rechecking levels today. Await results. Call with any concerns.       Relevant Orders   CBC with Differential/Platelet   Comprehensive metabolic panel   Lipid Panel w/o Chol/HDL Ratio   Recurrent major depressive disorder, in full remission ([place])    Stable off medication. Continue to monitor. Call with any concerns.       Relevant Medications   citalopram (CELEXA) 40 MG tablet   Other Relevant Orders   CBC with Differential/Platelet   TSH   UA/M w/rflx Culture, Routine   Advance directive discussed with patient    A voluntary discussion about advance care planning including the explanation and discussion of advance directives was extensively discussed  with the patient for 10 minutes with patient.  Explanation about the health care proxy and Living will was  reviewed and packet with forams with explanation of how to fill them out was given.  During this discussion, the patient was not able to identify a health care proxy and plans to fill out the paperwork required.  Patient was offered a separate [place] visit for further assistance with forms.          Other Visit Diagnoses    Welcome to Medicare preventive visit    -  Primary   Preventative care discussed as below.    Relevant Orders   EKG 12-Lead (Completed)   US ABDOMINAL AORTA SCREENING AAA   Screening for diabetes mellitus       Labs drawn today. Await results.    Relevant Orders   Bayer DCA Hb A1c Waived   UA/M w/rflx Culture, Routine   Screening for HIV without presence of risk factors       Labs drawn today. Await results.    Relevant Orders   HIV Antibody (routine testing w rflx)   Need for hepatitis C screening test       Labs drawn today. Await results.    Relevant Orders   Hepatitis C Antibody   Postmenopausal estrogen deficiency       DEXA ordered today   Relevant Orders   DG Bone Density   Breast cancer screening       Mammogram ordered today   Relevant Orders   MM DIGITAL SCREENING  BILATERAL   Colon cancer screening       Referral to GI placed today.   Relevant Orders   Ambulatory referral to Gastroenterology   Family history of abdominal aortic aneurysm (AAA)       In her father. Screening US ordered today.   Relevant Orders   US ABDOMINAL AORTA SCREENING AAA   Dyspareunia, female       ? due to dropped bladder, otherwise normal appearing exam. Will get her into GYN.    Relevant Orders   Ambulatory referral to Obstetrics / Gynecology       Preventative Services:  AAA screening: Ordered Today Health Risk Assessment and Personalized Prevention Plan: Done today Bone Mass Measurements: Ordered today Breast Cancer Screening: Ordered today CVD Screening: Done today Cervical Cancer Screening: N/A Colon Cancer Screening: Ordered today Depression Screening: Done today Diabetes Screening: Done today Glaucoma Screening: See your Eye Doctor Hepatitis B vaccine: N/A Hepatitis C screening: Done today HIV Screening: Done today Flu Vaccine: Declined Lung cancer Screening: N/A Obesity Screening: Done today Pneumonia Vaccines (2): Declined STI Screening: N/A  Follow up plan: Return in about 6 months (around 10/02/2018) for FOllow up.   LABORATORY TESTING:  - Pap smear: not applicable  IMMUNIZATIONS:   - Tdap: Tetanus vaccination status reviewed: Declined. - Influenza: Refused - Pneumovax: Refused - Prevnar: Refused - Zostavax vaccine: Will get at the pharmacy  SCREENING: -Mammogram: Ordered today  - Colonoscopy: Ordered today  - Bone Density: Ordered today  -Hearing Test: Ordered today   PATIENT COUNSELING:   Advised to take 1 mg of folate supplement per day if capable of pregnancy.   Sexuality: Discussed sexually transmitted diseases, partner selection, use of condoms, avoidance of unintended pregnancy  and contraceptive alternatives.   Advised to avoid cigarette smoking.  I discussed with the patient that most people either abstain from alcohol or  drink within safe limits (<=14/week and <=4 drinks/occasion for males, <=7/weeks and <= 3 drinks/occasion for females) and that the risk for alcohol disorders and other health effects rises proportionally  with the number of drinks per week and how often a drinker exceeds daily limits.  Discussed cessation/primary prevention of drug use and availability of treatment for abuse.   Diet: Encouraged to adjust caloric intake to maintain  or achieve ideal body weight, to reduce intake of dietary saturated fat and total fat, to limit sodium intake by avoiding high sodium foods and not adding table salt, and to maintain adequate dietary potassium and calcium preferably from fresh fruits, vegetables, and low-fat dairy products.    stressed the importance of regular exercise  Injury prevention: Discussed safety belts, safety helmets, smoke detector, smoking near bedding or upholstery.   Dental health: Discussed importance of regular tooth brushing, flossing, and dental visits.    NEXT PREVENTATIVE PHYSICAL DUE IN 1 YEAR. Return in about 6 months (around 10/02/2018) for FOllow up.

## 2018-04-02 NOTE — Telephone Encounter (Signed)
Patient was given 30 tablets with 5 refills in June. Should not be due. Not sure what's going on here.

## 2018-04-02 NOTE — Assessment & Plan Note (Signed)
Rechecking levels today. Await results. Call with any concerns.  

## 2018-04-02 NOTE — Patient Instructions (Addendum)
Please call [facility] @ [phone] to schedule your mammogram, bone density.   Preventative Services:  AAA screening: Ordered Today Health Risk Assessment and Personalized Prevention Plan: Done today Bone Mass Measurements: Ordered today Breast Cancer Screening: Ordered today CVD Screening: Done today Cervical Cancer Screening: N/A Colon Cancer Screening: Ordered today Depression Screening: Done today Diabetes Screening: Done today Glaucoma Screening: See your Eye Doctor Hepatitis B vaccine: N/A Hepatitis C screening: Done today HIV Screening: Done today Flu Vaccine: Declined Lung cancer Screening: N/A Obesity Screening: Done today Pneumonia Vaccines (2): Declined STI Screening: N/A  Health Maintenance for Postmenopausal Women Menopause is a normal process in which your reproductive ability comes to an end. This process happens gradually over a span of months to years, usually between the ages of 35 and 82. Menopause is complete when you have missed 12 consecutive menstrual periods. It is important to talk with your health care provider about some of the most common conditions that affect postmenopausal women, such as heart disease, cancer, and bone loss (osteoporosis). Adopting a healthy lifestyle and getting preventive care can help to promote your health and wellness. Those actions can also lower your chances of developing some of these common conditions. What should I know about menopause? During menopause, you may experience a number of symptoms, such as:  Moderate-to-severe hot flashes.  Night sweats.  Decrease in sex drive.  Mood swings.  Headaches.  Tiredness.  Irritability.  Memory problems.  Insomnia.  Choosing to treat or not to treat menopausal changes is an individual decision that you make with your health care provider. What should I know about hormone replacement therapy and supplements? Hormone therapy products are effective for treating symptoms  that are associated with menopause, such as hot flashes and night sweats. Hormone replacement carries certain risks, especially as you become older. If you are thinking about using estrogen or estrogen with progestin treatments, discuss the benefits and risks with your health care provider. What should I know about heart disease and stroke? Heart disease, heart attack, and stroke become more likely as you age. This may be due, in part, to the hormonal changes that your body experiences during menopause. These can affect how your body processes dietary fats, triglycerides, and cholesterol. Heart attack and stroke are both medical emergencies. There are many things that you can do to help prevent heart disease and stroke:  Have your blood pressure checked at least every 1-2 years. High blood pressure causes heart disease and increases the risk of stroke.  If you are 57-51 years old, ask your health care provider if you should take aspirin to prevent a heart attack or a stroke.  Do not use any tobacco products, including cigarettes, chewing tobacco, or electronic cigarettes. If you need help quitting, ask your health care provider.  It is important to eat a healthy diet and maintain a healthy weight. ? Be sure to include plenty of vegetables, fruits, low-fat dairy products, and lean protein. ? Avoid eating foods that are high in solid fats, added sugars, or salt (sodium).  Get regular exercise. This is one of the most important things that you can do for your health. ? Try to exercise for at least 150 minutes each week. The type of exercise that you do should increase your heart rate and make you sweat. This is known as moderate-intensity exercise. ? Try to do strengthening exercises at least twice each week. Do these in addition to the moderate-intensity exercise.  Know your numbers.Ask your  health care provider to check your cholesterol and your blood glucose. Continue to have your blood tested as  directed by your health care provider.  What should I know about cancer screening? There are several types of cancer. Take the following steps to reduce your risk and to catch any cancer development as early as possible. Breast Cancer  Practice breast self-awareness. ? This means understanding how your breasts normally appear and feel. ? It also means doing regular breast self-exams. Let your health care provider know about any changes, no matter how small.  If you are 58 or older, have a clinician do a breast exam (clinical breast exam or CBE) every year. Depending on your age, family history, and medical history, it may be recommended that you also have a yearly breast X-ray (mammogram).  If you have a family history of breast cancer, talk with your health care provider about genetic screening.  If you are at high risk for breast cancer, talk with your health care provider about having an MRI and a mammogram every year.  Breast cancer (BRCA) gene test is recommended for women who have family members with BRCA-related cancers. Results of the assessment will determine the need for genetic counseling and BRCA1 and for BRCA2 testing. BRCA-related cancers include these types: ? Breast. This occurs in males or females. ? Ovarian. ? Tubal. This may also be called fallopian tube cancer. ? Cancer of the abdominal or pelvic lining (peritoneal cancer). ? Prostate. ? Pancreatic.  Cervical, Uterine, and Ovarian Cancer Your health care provider may recommend that you be screened regularly for cancer of the pelvic organs. These include your ovaries, uterus, and vagina. This screening involves a pelvic exam, which includes checking for microscopic changes to the surface of your cervix (Pap test).  For women ages 21-65, health care providers may recommend a pelvic exam and a Pap test every three years. For women ages 44-65, they may recommend the Pap test and pelvic exam, combined with testing for human  papilloma virus (HPV), every five years. Some types of HPV increase your risk of cervical cancer. Testing for HPV may also be done on women of any age who have unclear Pap test results.  Other health care providers may not recommend any screening for nonpregnant women who are considered low risk for pelvic cancer and have no symptoms. Ask your health care provider if a screening pelvic exam is right for you.  If you have had past treatment for cervical cancer or a condition that could lead to cancer, you need Pap tests and screening for cancer for at least 20 years after your treatment. If Pap tests have been discontinued for you, your risk factors (such as having a new sexual partner) need to be reassessed to determine if you should start having screenings again. Some women have medical problems that increase the chance of getting cervical cancer. In these cases, your health care provider may recommend that you have screening and Pap tests more often.  If you have a family history of uterine cancer or ovarian cancer, talk with your health care provider about genetic screening.  If you have vaginal bleeding after reaching menopause, tell your health care provider.  There are currently no reliable tests available to screen for ovarian cancer.  Lung Cancer Lung cancer screening is recommended for adults 31-7 years old who are at high risk for lung cancer because of a history of smoking. A yearly low-dose CT scan of the lungs is recommended if  you:  Currently smoke.  Have a history of at least 30 pack-years of smoking and you currently smoke or have quit within the past 15 years. A pack-year is smoking an average of one pack of cigarettes per day for one year.  Yearly screening should:  Continue until it has been 15 years since you quit.  Stop if you develop a health problem that would prevent you from having lung cancer treatment.  Colorectal Cancer  This type of cancer can be detected and  can often be prevented.  Routine colorectal cancer screening usually begins at age 58 and continues through age 6.  If you have risk factors for colon cancer, your health care provider may recommend that you be screened at an earlier age.  If you have a family history of colorectal cancer, talk with your health care provider about genetic screening.  Your health care provider may also recommend using home test kits to check for hidden blood in your stool.  A small camera at the end of a tube can be used to examine your colon directly (sigmoidoscopy or colonoscopy). This is done to check for the earliest forms of colorectal cancer.  Direct examination of the colon should be repeated every 5-10 years until age 17. However, if early forms of precancerous polyps or small growths are found or if you have a family history or genetic risk for colorectal cancer, you may need to be screened more often.  Skin Cancer  Check your skin from head to toe regularly.  Monitor any moles. Be sure to tell your health care provider: ? About any new moles or changes in moles, especially if there is a change in a mole's shape or color. ? If you have a mole that is larger than the size of a pencil eraser.  If any of your family members has a history of skin cancer, especially at a young age, talk with your health care provider about genetic screening.  Always use sunscreen. Apply sunscreen liberally and repeatedly throughout the day.  Whenever you are outside, protect yourself by wearing long sleeves, pants, a wide-brimmed hat, and sunglasses.  What should I know about osteoporosis? Osteoporosis is a condition in which bone destruction happens more quickly than new bone creation. After menopause, you may be at an increased risk for osteoporosis. To help prevent osteoporosis or the bone fractures that can happen because of osteoporosis, the following is recommended:  If you are 63-23 years old, get at least  1,000 mg of calcium and at least 600 mg of vitamin D per day.  If you are older than age 22 but younger than age 84, get at least 1,200 mg of calcium and at least 600 mg of vitamin D per day.  If you are older than age 33, get at least 1,200 mg of calcium and at least 800 mg of vitamin D per day.  Smoking and excessive alcohol intake increase the risk of osteoporosis. Eat foods that are rich in calcium and vitamin D, and do weight-bearing exercises several times each week as directed by your health care provider. What should I know about how menopause affects my mental health? Depression may occur at any age, but it is more common as you become older. Common symptoms of depression include:  Low or sad mood.  Changes in sleep patterns.  Changes in appetite or eating patterns.  Feeling an overall lack of motivation or enjoyment of activities that you previously enjoyed.  Frequent crying  spells.  Talk with your health care provider if you think that you are experiencing depression. What should I know about immunizations? It is important that you get and maintain your immunizations. These include:  Tetanus, diphtheria, and pertussis (Tdap) booster vaccine.  Influenza every year before the flu season begins.  Pneumonia vaccine.  Shingles vaccine.  Your health care provider may also recommend other immunizations. This information is not intended to replace advice given to you by your health care provider. Make sure you discuss any questions you have with your health care provider. Document Released: 08/11/2005 Document Revised: 01/07/2016 Document Reviewed: 03/23/2015 Elsevier Interactive Patient Education  2018 [employer].  Dyspareunia, Female Dyspareunia is pain that is associated with sexual activity. This can affect any part of the genitals or lower abdomen, and there are many possible causes. This condition ranges from mild to severe. Depending on the cause, dyspareunia may  get better with treatment, or it may return (recur) over time. What are the causes? The cause of this condition is not always known. Possible causes include:  Cancer.  Psychological factors, such as depression, anxiety, or previous traumatic experiences.  Severe pain and tenderness of the skin around the vagina (vulva) when it is touched (vulvar vestibulitis syndrome).  Infection of the pelvis or the vulva.  Infection of the vagina.  Painful, involuntary tightening (contraction) of the vaginal muscles when anything is put inside the vagina (vaginismus).  Allergic reaction.  Ovarian cysts.  Solid growths of tissue (tumors) in the ovaries or the uterus.  Scar tissue in the ovaries, vagina, or pelvis.  Vaginal dryness.  Thinning of the tissue (atrophy) of the vulva and vagina.  Skin conditions that affect the vulva (vulvar dermatoses), such as lichen sclerosus or lichen planus.  Endometriosis.  Tubal pregnancy.  A tilted uterus.  Uterine prolapse.  Adhesions in the vagina.  Bladder problems.  Intestinal problems.  Certain medicines.  Medical conditions such as diabetes, arthritis, or thyroid disease.  What increases the risk? The following factors may make you more likely to develop this condition:  Having experienced physical or sexual trauma.  Having given birth more than once.  Taking birth control pills.  Having gone through menopause.  Having recently given birth, typically within the past 3-6 months.  Breastfeeding.  What are the signs or symptoms? The main symptom of this condition is pain in any part of the genitals or lower abdomen during or after sexual activity. This may include pain during sexual arousal, genital stimulation, or orgasm. Pain may get worse when anything is inserted into the vagina, or when the genitals are touched in any way, such as when sitting or wearing pants. Pain can range from mild to severe, depending on the cause of  the condition. In some cases, symptoms go away with treatment and return (recur) at a later date. How is this diagnosed? This condition may be diagnosed based on:  Your symptoms, including: ? Where your pain is located. ? When your pain occurs.  Your medical history.  A physical exam. This may include a pelvic exam and a Pap test. This is a screening test that is used to check for signs of cancer of the vagina, cervix, and uterus.  Tests, including: ? Blood tests. ? Ultrasound. This uses sound waves to make a picture of the area that is being tested. ? Urine culture. This test involves checking a urine sample for signs of infection. ? Culture test. This is when your health care provider  uses a swab to collect a sample of vaginal fluid. The sample is checked for signs of infection. ? X-rays. ? MRI. ? CT scan. ? Laparoscopy. This is a procedure in which a small incision is made in your lower abdomen and a lighted, pencil-sized instrument (laparoscope) is passed through the incision and used to look inside your pelvis.  You may be referred to a health care provider who specializes in women's health (gynecologist). In some cases, diagnosing the cause of dyspareunia can be difficult. How is this treated? Treatment depends on the cause of your condition and your symptoms. In most cases, you may need to stop sexual activity until your symptoms improve. Treatment may include:  Lubricants.  Kegel exercises or vaginal dilators.  Medicated skin creams.  Medicated vaginal creams.  Hormonal therapy.  Antibiotic medicine to prevent or fight infection.  Medicines that help to relieve pain.  Medicines that treat depression (antidepressants).  Psychological counseling.  Sex therapy.  Surgery.  Follow these instructions at home: Lifestyle  Avoid tight clothing and irritating materials around your genital and abdominal area.  Use water-based lubricants as needed. Avoid oil-based  lubricants.  Do not use any products that irritate you. This may include certain condoms, spermicides, lubricants, soaps, tampons, vaginal sprays, or douches.  Always practice safe sex. Talk with your health care provider about which form of birth control (contraception) is best for you.  Maintain open communication with your sexual partner. General instructions  Take over-the-counter and prescription medicines only as told by your health care provider.  If you had tests done, it is your responsibility to get your tests results. Ask your health care provider or the department performing the test when your results will be ready.  Urinate before you engage in sexual activity.  Consider joining a support group.  Keep all follow-up visits as told by your health care provider. This is important. Contact a health care provider if:  You develop vaginal bleeding after sexual intercourse.  You develop a lump at the opening of your vagina. Seek medical care even if the lump is painless.  You have: ? Abnormal vaginal discharge. ? Vaginal dryness. ? Itchiness or irritation of your vulva or vagina. ? A new rash. ? Symptoms that get worse or do not improve with treatment. ? A fever. ? Pain when you urinate. ? Blood in your urine. Get help right away if:  You develop severe pain in your abdomen during or shortly after sexual intercourse.  You pass out after having sexual intercourse. This information is not intended to replace advice given to you by your health care provider. Make sure you discuss any questions you have with your health care provider. Document Released: 07/09/2007 Document Revised: 10/29/2015 Document Reviewed: 01/19/2015 Elsevier Interactive Patient Education  [employer].

## 2018-04-02 NOTE — Assessment & Plan Note (Signed)
Stable off medication. Continue to monitor. Call with any concerns.

## 2018-04-02 NOTE — Telephone Encounter (Signed)
Copied from [facility] [phone]. Topic: Quick Communication - See Telephone Encounter >> Apr 01, 2018  2:44 PM Antonieta Iba C wrote: CRM for notification. See Telephone encounter for: 04/01/18.   Rich w/ [facility] is calling in to get confirmation on Rx received for zolpidem (AMBIEN) 10 MG tablet   CB: [phone]

## 2018-04-02 NOTE — Assessment & Plan Note (Signed)
A voluntary discussion about advance care planning including the explanation and discussion of advance directives was extensively discussed  with the patient for 10 minutes with patient.  Explanation about the health care proxy and Living will was reviewed and packet with forams with explanation of how to fill them out was given.  During this discussion, the patient was not able to identify a health care proxy and plans to fill out the paperwork required.  Patient was offered a separate [facility] visit for further assistance with forms.

## 2018-04-03 ENCOUNTER — Telehealth: Payer: Self-pay

## 2018-04-03 ENCOUNTER — Other Ambulatory Visit: Payer: Self-pay

## 2018-04-03 DIAGNOSIS — Z1211 Encounter for screening for malignant neoplasm of colon: Secondary | ICD-10-CM

## 2018-04-03 LAB — UA/M W/RFLX CULTURE, ROUTINE
Bilirubin, UA: NEGATIVE
GLUCOSE, UA: NEGATIVE
KETONES UA: NEGATIVE
Nitrite, UA: NEGATIVE
Protein, UA: NEGATIVE
SPEC GRAV UA: 1.01 (ref 1.005–1.030)
Urobilinogen, Ur: 0.2 mg/dL (ref 0.2–1.0)
pH, UA: 5.5 (ref 5.0–7.5)

## 2018-04-03 LAB — COMPREHENSIVE METABOLIC PANEL
ALT: 10 IU/L (ref 0–32)
AST: 14 IU/L (ref 0–40)
Albumin/Globulin Ratio: 1.7 (ref 1.2–2.2)
Albumin: 4.4 g/dL (ref 3.6–4.8)
Alkaline Phosphatase: 72 IU/L (ref 39–117)
BUN/Creatinine Ratio: 23 (ref 12–28)
BUN: 16 mg/dL (ref 8–27)
Bilirubin Total: <0.2 mg/dL (ref 0.0–1.2)
CALCIUM: 9.1 mg/dL (ref 8.7–10.3)
CO2: 23 mmol/L (ref 20–29)
Chloride: 100 mmol/L (ref 96–106)
Creatinine, Ser: 0.71 mg/dL (ref 0.57–1.00)
GFR, EST AFRICAN AMERICAN: 103 mL/min/{1.73_m2} (ref >59)
GFR, EST NON AFRICAN AMERICAN: 90 mL/min/{1.73_m2} (ref >59)
GLUCOSE: 89 mg/dL (ref 65–99)
Globulin, Total: 2.6 g/dL (ref 1.5–4.5)
Potassium: 4 mmol/L (ref 3.5–5.2)
Sodium: 140 mmol/L (ref 134–144)
TOTAL PROTEIN: 7 g/dL (ref 6.0–8.5)

## 2018-04-03 LAB — LIPID PANEL W/O CHOL/HDL RATIO
Cholesterol, Total: 268 mg/dL — ABNORMAL HIGH (ref 100–199)
HDL: 67 mg/dL (ref >39)
LDL Calculated: 177 mg/dL — ABNORMAL HIGH (ref 0–99)
Triglycerides: 122 mg/dL (ref 0–149)
VLDL CHOLESTEROL CAL: 24 mg/dL (ref 5–40)

## 2018-04-03 LAB — MICROSCOPIC EXAMINATION

## 2018-04-03 LAB — CBC WITH DIFFERENTIAL/PLATELET
BASOS ABS: 0 10*3/uL (ref 0.0–0.2)
BASOS: 0 %
EOS (ABSOLUTE): 0.2 10*3/uL (ref 0.0–0.4)
Eos: 2 %
Hematocrit: 35.6 % (ref 34.0–46.6)
Hemoglobin: 11.6 g/dL (ref 11.1–15.9)
IMMATURE GRANS (ABS): 0 10*3/uL (ref 0.0–0.1)
IMMATURE GRANULOCYTES: 0 %
LYMPHS: 36 %
Lymphocytes Absolute: 2.7 10*3/uL (ref 0.7–3.1)
MCH: 30.5 pg (ref 26.6–33.0)
MCHC: 32.6 g/dL (ref 31.5–35.7)
MCV: 94 fL (ref 79–97)
Monocytes Absolute: 0.5 10*3/uL (ref 0.1–0.9)
Monocytes: 7 %
NEUTROS PCT: 55 %
Neutrophils Absolute: 4 10*3/uL (ref 1.4–7.0)
PLATELETS: 279 10*3/uL (ref 150–450)
RBC: 3.8 x10E6/uL (ref 3.77–5.28)
RDW: 13.1 % (ref 12.3–15.4)
WBC: 7.4 10*3/uL (ref 3.4–10.8)

## 2018-04-03 LAB — BAYER DCA HB A1C WAIVED: HB A1C (BAYER DCA - WAIVED): 5 % (ref <7.0)

## 2018-04-03 LAB — HIV ANTIBODY (ROUTINE TESTING W REFLEX): HIV SCREEN 4TH GENERATION: NONREACTIVE

## 2018-04-03 LAB — TSH: TSH: 2.81 u[IU]/mL (ref 0.450–4.500)

## 2018-04-03 LAB — HEPATITIS C ANTIBODY

## 2018-04-03 NOTE — Telephone Encounter (Signed)
PA approved.

## 2018-04-03 NOTE — Telephone Encounter (Signed)
PA for Zolpidem initiated via Cover My Meds. Will await determination.

## 2018-04-04 ENCOUNTER — Telehealth: Payer: Self-pay | Admitting: Family Medicine

## 2018-04-04 ENCOUNTER — Encounter: Payer: Self-pay | Admitting: Family Medicine

## 2018-04-04 NOTE — Telephone Encounter (Signed)
Please let her know that her labs all look good, but her cholesterol is quite high. If she's OK with it, I'll start her on some medicine to help bring it down. Everything else looks good. Thanks!

## 2018-04-04 NOTE — Telephone Encounter (Signed)
Patient notified. Patient states she would like to think about starting a new medication. States she wants to wait and see what it is at her follow up.

## 2018-04-11 ENCOUNTER — Ambulatory Visit
Admission: RE | Admit: 2018-04-11 | Discharge: 2018-04-11 | Disposition: A | Discharge status: Home / Self Care | Payer: Medicare HMO | Source: Ambulatory Visit | Attending: Family Medicine | Admitting: Family Medicine

## 2018-04-11 ENCOUNTER — Telehealth: Payer: Self-pay | Admitting: Family Medicine

## 2018-04-11 DIAGNOSIS — Z8249 Family history of ischemic heart disease and other diseases of the circulatory system: Secondary | ICD-10-CM | POA: Diagnosis not present

## 2018-04-11 DIAGNOSIS — Z8489 Family history of other specified conditions: Secondary | ICD-10-CM | POA: Diagnosis not present

## 2018-04-11 DIAGNOSIS — Z0000 Encounter for general adult medical examination without abnormal findings: Secondary | ICD-10-CM

## 2018-04-11 DIAGNOSIS — Z136 Encounter for screening for cardiovascular disorders: Secondary | ICD-10-CM | POA: Diagnosis not present

## 2018-04-11 DIAGNOSIS — Z803 Family history of malignant neoplasm of breast: Secondary | ICD-10-CM | POA: Diagnosis not present

## 2018-04-11 NOTE — Telephone Encounter (Signed)
Patient notified

## 2018-04-11 NOTE — Telephone Encounter (Signed)
Please let her know that her [place] came back normal. No sign of AAA. Thanks!

## 2018-04-16 ENCOUNTER — Other Ambulatory Visit: Payer: Self-pay

## 2018-04-16 ENCOUNTER — Encounter: Payer: Self-pay | Admitting: *Deleted

## 2018-04-17 ENCOUNTER — Encounter: Payer: Self-pay | Admitting: Anesthesiology

## 2018-04-17 DIAGNOSIS — M7918 Myalgia, other site: Secondary | ICD-10-CM | POA: Diagnosis not present

## 2018-04-17 DIAGNOSIS — M545 Low back pain: Secondary | ICD-10-CM | POA: Diagnosis not present

## 2018-04-17 DIAGNOSIS — M5137 Other intervertebral disc degeneration, lumbosacral region: Secondary | ICD-10-CM | POA: Diagnosis not present

## 2018-04-17 DIAGNOSIS — M9903 Segmental and somatic dysfunction of lumbar region: Secondary | ICD-10-CM | POA: Diagnosis not present

## 2018-04-17 DIAGNOSIS — M5416 Radiculopathy, lumbar region: Secondary | ICD-10-CM | POA: Diagnosis not present

## 2018-04-17 DIAGNOSIS — M9904 Segmental and somatic dysfunction of sacral region: Secondary | ICD-10-CM | POA: Diagnosis not present

## 2018-04-17 DIAGNOSIS — M5417 Radiculopathy, lumbosacral region: Secondary | ICD-10-CM | POA: Diagnosis not present

## 2018-04-17 DIAGNOSIS — M5136 Other intervertebral disc degeneration, lumbar region: Secondary | ICD-10-CM | POA: Diagnosis not present

## 2018-04-18 ENCOUNTER — Encounter: Payer: Self-pay | Admitting: Obstetrics and Gynecology

## 2018-04-18 ENCOUNTER — Ambulatory Visit: Payer: Medicare HMO | Admitting: Obstetrics and Gynecology

## 2018-04-18 VITALS — BP 128/60 | HR 75 | Ht 63.0 in | Wt 132.0 lb

## 2018-04-18 DIAGNOSIS — N8110 Cystocele, unspecified: Secondary | ICD-10-CM | POA: Diagnosis not present

## 2018-04-18 DIAGNOSIS — N941 Unspecified dyspareunia: Secondary | ICD-10-CM | POA: Diagnosis not present

## 2018-04-18 DIAGNOSIS — N952 Postmenopausal atrophic vaginitis: Secondary | ICD-10-CM

## 2018-04-18 MED ORDER — ESTRADIOL 0.1 MG/GM VA CREA: 1.0000 | TOPICAL_CREAM | Freq: Every day | VAGINAL | 1 refills | Status: DC

## 2018-04-18 NOTE — Progress Notes (Signed)
Kathryn Roys, Kathryn Conway   Chief Complaint  Patient presents with  . Dyspareunia    x a few yrs with pain during intercourse, had her wellness check in october 2nd and was told bladder had fallen a little    HPI:      Kathryn Conway is a 67 y.o. [phone] who LMP was No LMP recorded. Patient has had a hysterectomy., presents today for NP eval of dyspareunia, referred by PCP. Sx for the past 1 1/2 yrs. Feels like a little cut during foreplay and sex, but no bleeding. Using vaseline as lubricant but that doesn't improve sx. No sx if not sex active. No unusual vag sx. Has never done vag ERT. No PMB. S/p TAHLSO for pelvic pain that turned out to be fibromyalgia. PCP noted bladder has dropped slightly. Pt with occas SUI but no other urin issues.  Annuals with PCP.   Past Medical History:  Diagnosis Date  . Depression   . Fibromyalgia   . Hyperlipidemia   . Insomnia   . PONV (postoperative nausea and vomiting)   . TMJ (temporomandibular joint disorder)   . Uterine fibroid   . Wears contact lenses   . Wears dentures    full upper, partial lower    Past Surgical History:  Procedure Laterality Date  . ABDOMINAL HYSTERECTOMY    . COLONOSCOPY  05/20/2012   repeat 5 years  . laparoscopic gyn procedure     done before her hysterectomy  . OOPHORECTOMY Left     Family History  Problem Relation Age of Onset  . Heart disease Mother   . Hypertension Mother   . Depression Mother   . Osteoporosis Mother   . Colon polyps Mother   . Cirrhosis Mother        non alcoholic  . Liver disease Mother        fatty liver disease  . Heart disease Father   . Hypertension Father   . Colon polyps Father   . Stroke Father   . Heart disease Sister   . Hypertension Sister   . Colon polyps Sister   . Diabetes Sister   . Hypertension Daughter   . Colon polyps Daughter   . Heart attack Brother   . Hypertension Brother   . Heart attack Brother   . Hyperlipidemia Son     Social History     Socioeconomic History  . Marital status: Single    Spouse name: Not on file  . Number of children: Not on file  . Years of education: Not on file  . Highest education level: Not on file  Occupational History  . Not on file  Social Needs  . Financial resource strain: Not on file  . Food insecurity:    Worry: Not on file    Inability: Not on file  . Transportation needs:    Medical: Not on file    Non-medical: Not on file  Tobacco Use  . Smoking status: Never Smoker  . Smokeless tobacco: Never Used  Substance and Sexual Activity  . Alcohol use: Yes    Alcohol/week: 3.0 standard drinks    Types: 3 Glasses of wine per week  . Drug use: No  . Sexual activity: Yes    Birth control/protection: Surgical    Comment: Hysterectomy  Lifestyle  . Physical activity:    Days per week: Not on file    Minutes per session: Not on file  . Stress: Not on  file  Relationships  . Social connections:    Talks on phone: Not on file    Gets together: Not on file    Attends religious service: Not on file    Active member of club or organization: Not on file    Attends meetings of clubs or organizations: Not on file    Relationship status: Not on file  . Intimate partner violence:    Fear of current or ex partner: Not on file    Emotionally abused: Not on file    Physically abused: Not on file    Forced sexual activity: Not on file  Other Topics Concern  . Not on file  Social History Narrative  . Not on file    Outpatient Medications Prior to Visit  Medication Sig Dispense Refill  . aspirin EC 81 MG tablet Take 81 mg by mouth daily.    . citalopram (CELEXA) 40 MG tablet Take 1 tablet (40 mg total) by mouth daily. 90 tablet 1  . gabapentin (NEURONTIN) 300 MG capsule Take 3 capsules (900 mg total) by mouth 2 (two) times daily. 540 capsule 1  . loratadine (CLARITIN) 10 MG tablet Take 10 mg by mouth daily.    Marland Kitchen MEGARED OMEGA-3 KRILL OIL PO Take by mouth daily.    Marland Kitchen zolpidem (AMBIEN) 10  MG tablet Take 0.5 tablets (5 mg total) by mouth at bedtime as needed. 30 tablet 5   No facility-administered medications prior to visit.       ROS:  Review of Systems  Constitutional: Negative for fatigue, fever and unexpected weight change.  Respiratory: Negative for cough, shortness of breath and wheezing.   Cardiovascular: Negative for chest pain, palpitations and leg swelling.  Gastrointestinal: Negative for blood in stool, constipation, diarrhea, nausea and vomiting.  Endocrine: Negative for cold intolerance, heat intolerance and polyuria.  Genitourinary: Positive for dyspareunia. Negative for dysuria, flank pain, frequency, genital sores, hematuria, menstrual problem, pelvic pain, urgency, vaginal bleeding, vaginal discharge and vaginal pain.  Musculoskeletal: Negative for back pain, joint swelling and myalgias.  Skin: Negative for rash.  Neurological: Negative for dizziness, syncope, light-headedness, numbness and headaches.  Hematological: Negative for adenopathy.  Psychiatric/Behavioral: Negative for agitation, confusion, sleep disturbance and suicidal ideas. The patient is not nervous/anxious.    BREAST: No symptoms   OBJECTIVE:   Vitals:  BP 128/60   Pulse 75   Ht 5\' 3"  (1.6 m)   Wt 132 lb (59.9 kg)   BMI 23.38 kg/m   Physical Exam  Constitutional: She is oriented to person, place, and time. Vital signs are normal. She appears well-developed.  Pulmonary/Chest: Effort normal.  Genitourinary: Vagina normal. There is no rash, tenderness or lesion on the right labia. There is no rash, tenderness or lesion on the left labia. Uterus is not tender. Right adnexum displays no mass and no tenderness. Left adnexum displays no mass and no tenderness. No erythema or tenderness in the vagina. No vaginal discharge found.  Genitourinary Comments: UTERUS AND CX SURG ABSENT; PT WITH PALE, ATROPHIC VAG TISSUE; NO LESIONS/CUTS; GRADE 1 CYSTOCELE WITH VASLALVA  Musculoskeletal: Normal  range of motion.  Neurological: She is alert and oriented to person, place, and time.  Psychiatric: She has a normal mood and affect. Her behavior is normal. Thought content normal.  Vitals reviewed.   Assessment/Plan: Dyspareunia in female - Most likely due to vaginal atrophy. Try vag ERT. Rx estrace crm. F/u prn. Use coconut oil (not vaseline) as lubricant. F/u prn.  -  Plan: estradiol (ESTRACE) 0.1 MG/GM vaginal cream  Postmenopausal atrophic vaginitis - Plan: estradiol (ESTRACE) 0.1 MG/GM vaginal cream  Baden-Walker grade 1 cystocele - No significant sx. Reassurance. F/u prn.     Meds ordered this encounter  Medications  . estradiol (ESTRACE) 0.1 MG/GM vaginal cream    Sig: Place 1 Applicatorful vaginally at bedtime. Insert 1g nightly for 1 wk, then 1 g once weekly as maintenace    Dispense:  42.5 g    Refill:  1    Order Specific Question:   Supervising Provider    Answer:   Gae Dry [[id number]]      Return if symptoms worsen or fail to improve.  Jordyan Hardiman B. Barett Whidbee, PA-C 04/18/2018 3:50 PM

## 2018-04-18 NOTE — Patient Instructions (Signed)
I value your feedback and entrusting us with your care. If you get a [facility] patient survey, I would appreciate you taking the time to let us know about your experience today. Thank you! 

## 2018-04-22 DIAGNOSIS — M5137 Other intervertebral disc degeneration, lumbosacral region: Secondary | ICD-10-CM | POA: Diagnosis not present

## 2018-04-22 DIAGNOSIS — M9903 Segmental and somatic dysfunction of lumbar region: Secondary | ICD-10-CM | POA: Diagnosis not present

## 2018-04-22 DIAGNOSIS — M7918 Myalgia, other site: Secondary | ICD-10-CM | POA: Diagnosis not present

## 2018-04-22 DIAGNOSIS — M5417 Radiculopathy, lumbosacral region: Secondary | ICD-10-CM | POA: Diagnosis not present

## 2018-04-22 DIAGNOSIS — M545 Low back pain: Secondary | ICD-10-CM | POA: Diagnosis not present

## 2018-04-22 DIAGNOSIS — M5136 Other intervertebral disc degeneration, lumbar region: Secondary | ICD-10-CM | POA: Diagnosis not present

## 2018-04-22 DIAGNOSIS — M5416 Radiculopathy, lumbar region: Secondary | ICD-10-CM | POA: Diagnosis not present

## 2018-04-22 DIAGNOSIS — M9904 Segmental and somatic dysfunction of sacral region: Secondary | ICD-10-CM | POA: Diagnosis not present

## 2018-04-23 DIAGNOSIS — M5137 Other intervertebral disc degeneration, lumbosacral region: Secondary | ICD-10-CM | POA: Diagnosis not present

## 2018-04-23 DIAGNOSIS — M9903 Segmental and somatic dysfunction of lumbar region: Secondary | ICD-10-CM | POA: Diagnosis not present

## 2018-04-23 DIAGNOSIS — M545 Low back pain: Secondary | ICD-10-CM | POA: Diagnosis not present

## 2018-04-23 DIAGNOSIS — M5136 Other intervertebral disc degeneration, lumbar region: Secondary | ICD-10-CM | POA: Diagnosis not present

## 2018-04-23 DIAGNOSIS — M5416 Radiculopathy, lumbar region: Secondary | ICD-10-CM | POA: Diagnosis not present

## 2018-04-23 DIAGNOSIS — M9904 Segmental and somatic dysfunction of sacral region: Secondary | ICD-10-CM | POA: Diagnosis not present

## 2018-04-23 DIAGNOSIS — M5417 Radiculopathy, lumbosacral region: Secondary | ICD-10-CM | POA: Diagnosis not present

## 2018-04-23 DIAGNOSIS — M7918 Myalgia, other site: Secondary | ICD-10-CM | POA: Diagnosis not present

## 2018-04-25 NOTE — Discharge Instructions (Signed)
General Anesthesia, Adult, Care After °These instructions provide you with information about caring for yourself after your procedure. Your health care provider may also give you more specific instructions. Your treatment has been planned according to current medical practices, but problems sometimes occur. Call your health care provider if you have any problems or questions after your procedure. °What can I expect after the procedure? °After the procedure, it is common to have: °· Vomiting. °· A sore throat. °· Mental slowness. ° °It is common to feel: °· Nauseous. °· Cold or shivery. °· Sleepy. °· Tired. °· Sore or achy, even in parts of your body where you did not have surgery. ° °Follow these instructions at home: °For at least 24 hours after the procedure: °· Do not: °? Participate in activities where you could fall or become injured. °? Drive. °? Use heavy machinery. °? Drink alcohol. °? Take sleeping pills or medicines that cause drowsiness. °? Make important decisions or sign legal documents. °? Take care of children on your own. °· Rest. °Eating and drinking °· If you vomit, drink water, juice, or soup when you can drink without vomiting. °· Drink enough fluid to keep your urine clear or pale yellow. °· Make sure you have little or no nausea before eating solid foods. °· Follow the diet recommended by your health care provider. °General instructions °· Have a responsible adult stay with you until you are awake and alert. °· Return to your normal activities as told by your health care provider. Ask your health care provider what activities are safe for you. °· Take over-the-counter and prescription medicines only as told by your health care provider. °· If you smoke, do not smoke without supervision. °· Keep all follow-up visits as told by your health care provider. This is important. °Contact a health care provider if: °· You continue to have nausea or vomiting at home, and medicines are not helpful. °· You  cannot drink fluids or start eating again. °· You cannot urinate after 8-12 hours. °· You develop a skin rash. °· You have fever. °· You have increasing redness at the site of your procedure. °Get help right away if: °· You have difficulty breathing. °· You have chest pain. °· You have unexpected bleeding. °· You feel that you are having a life-threatening or urgent problem. °This information is not intended to replace advice given to you by your health care provider. Make sure you discuss any questions you have with your health care provider. °Document Released: 09/25/2000 Document Revised: 11/22/2015 Document Reviewed: 06/03/2015 °Elsevier Interactive Patient Education © 2018 [employer] ° °

## 2018-04-26 ENCOUNTER — Ambulatory Visit
Admission: RE | Admit: 2018-04-26 | Discharge: 2018-04-26 | Disposition: A | Discharge status: Home / Self Care | Payer: Medicare HMO | Source: Ambulatory Visit | Attending: Gastroenterology | Admitting: Gastroenterology

## 2018-04-26 ENCOUNTER — Ambulatory Visit: Payer: Medicare HMO | Admitting: Anesthesiology

## 2018-04-26 ENCOUNTER — Encounter
Admission: RE | Disposition: A | Discharge status: Home / Self Care | Payer: Self-pay | Source: Ambulatory Visit | Attending: Gastroenterology

## 2018-04-26 DIAGNOSIS — G4700 Insomnia, unspecified: Secondary | ICD-10-CM | POA: Insufficient documentation

## 2018-04-26 DIAGNOSIS — Z79899 Other long term (current) drug therapy: Secondary | ICD-10-CM | POA: Diagnosis not present

## 2018-04-26 DIAGNOSIS — Z8249 Family history of ischemic heart disease and other diseases of the circulatory system: Secondary | ICD-10-CM | POA: Insufficient documentation

## 2018-04-26 DIAGNOSIS — Z7982 Long term (current) use of aspirin: Secondary | ICD-10-CM | POA: Insufficient documentation

## 2018-04-26 DIAGNOSIS — Z8371 Family history of colonic polyps: Secondary | ICD-10-CM | POA: Insufficient documentation

## 2018-04-26 DIAGNOSIS — Z90710 Acquired absence of both cervix and uterus: Secondary | ICD-10-CM | POA: Insufficient documentation

## 2018-04-26 DIAGNOSIS — Z882 Allergy status to sulfonamides status: Secondary | ICD-10-CM | POA: Insufficient documentation

## 2018-04-26 DIAGNOSIS — F329 Major depressive disorder, single episode, unspecified: Secondary | ICD-10-CM | POA: Insufficient documentation

## 2018-04-26 DIAGNOSIS — Z79890 Hormone replacement therapy (postmenopausal): Secondary | ICD-10-CM | POA: Insufficient documentation

## 2018-04-26 DIAGNOSIS — R69 Illness, unspecified: Secondary | ICD-10-CM | POA: Diagnosis not present

## 2018-04-26 DIAGNOSIS — M797 Fibromyalgia: Secondary | ICD-10-CM | POA: Insufficient documentation

## 2018-04-26 DIAGNOSIS — Z1211 Encounter for screening for malignant neoplasm of colon: Secondary | ICD-10-CM

## 2018-04-26 HISTORY — PX: COLONOSCOPY WITH PROPOFOL: SHX5780

## 2018-04-26 HISTORY — DX: Presence of spectacles and contact lenses: Z97.3

## 2018-04-26 HISTORY — DX: Other specified postprocedural states: Z98.890

## 2018-04-26 HISTORY — DX: Presence of dental prosthetic device (complete) (partial): Z97.2

## 2018-04-26 HISTORY — DX: Other specified postprocedural states: R11.2

## 2018-04-26 SURGERY — COLONOSCOPY WITH PROPOFOL
Anesthesia: General | Site: Rectum

## 2018-04-26 MED ORDER — PROPOFOL 10 MG/ML IV BOLUS
INTRAVENOUS | Status: DC | PRN
  Administered 2018-04-26: 30 mg via INTRAVENOUS
  Administered 2018-04-26: 20 mg via INTRAVENOUS
  Administered 2018-04-26: 100 mg via INTRAVENOUS
  Administered 2018-04-26 (×2): 30 mg via INTRAVENOUS

## 2018-04-26 MED ORDER — SODIUM CHLORIDE 0.9 % IV SOLN: INTRAVENOUS | Status: DC

## 2018-04-26 MED ORDER — LACTATED RINGERS IV SOLN
INTRAVENOUS | Status: DC
  Administered 2018-04-26: 09:00:00 via INTRAVENOUS

## 2018-04-26 MED ORDER — ACETAMINOPHEN 160 MG/5ML PO SOLN: 325.0000 mg | Freq: Once | ORAL | Status: DC

## 2018-04-26 MED ORDER — ACETAMINOPHEN 325 MG PO TABS: 325.0000 mg | ORAL_TABLET | Freq: Once | ORAL | Status: DC

## 2018-04-26 MED ORDER — STERILE WATER FOR IRRIGATION IR SOLN
Status: DC | PRN
  Administered 2018-04-26: 10:00:00

## 2018-04-26 MED ORDER — LIDOCAINE HCL (CARDIAC) PF 100 MG/5ML IV SOSY
PREFILLED_SYRINGE | INTRAVENOUS | Status: DC | PRN
  Administered 2018-04-26: 30 mg via INTRAVENOUS

## 2018-04-26 SURGICAL SUPPLY — 5 items
CANISTER SUCT 1200ML W/VALVE (MISCELLANEOUS) ×2 IMPLANT
GOWN CVR UNV OPN BCK APRN NK (MISCELLANEOUS) ×2 IMPLANT
GOWN ISOL THUMB LOOP REG UNIV (MISCELLANEOUS) ×2
KIT ENDO PROCEDURE OLY (KITS) ×2 IMPLANT
WATER STERILE IRR 250ML POUR (IV SOLUTION) ×2 IMPLANT

## 2018-04-26 NOTE — Anesthesia Preprocedure Evaluation (Signed)
Anesthesia Evaluation  Patient identified by MRN, date of birth, ID band Patient awake    Reviewed: Allergy & Precautions, H&P , NPO status , Patient's Chart, lab work & pertinent test results  History of Anesthesia Complications (+) PONV and history of anesthetic complications  Airway Mallampati: II  TM Distance: >3 FB Neck ROM: full    Dental  (+) Partial Lower, Upper Dentures   Pulmonary    Pulmonary exam normal breath sounds clear to auscultation       Cardiovascular Normal cardiovascular exam Rhythm:regular Rate:Normal     Neuro/Psych  Neuromuscular disease    GI/Hepatic   Endo/Other    Renal/GU      Musculoskeletal   Abdominal   Peds  Hematology   Anesthesia Other Findings   Reproductive/Obstetrics                             Anesthesia Physical Anesthesia Plan  ASA: II  Anesthesia Plan: General   Post-op Pain Management:    Induction: Intravenous  PONV Risk Score and Plan: 4 or greater and Propofol infusion and Treatment may vary due to age or medical condition  Airway Management Planned: Natural Airway  Additional Equipment:   Intra-op Plan:   Post-operative Plan:   Informed Consent: I have reviewed the patients History and Physical, chart, labs and discussed the procedure including the risks, benefits and alternatives for the proposed anesthesia with the patient or authorized representative who has indicated his/her understanding and acceptance.     Plan Discussed with: CRNA  Anesthesia Plan Comments:         Anesthesia Quick Evaluation

## 2018-04-26 NOTE — H&P (Signed)
Kathryn Lame, MD [address] [place], [place] [address] Phone: [phone] Fax : [phone]  Primary Care Physician:  Valerie Roys, DO Primary Gastroenterologist:  Dr. Allen Norris  Pre-Procedure History & Physical: HPI:  Kathryn Conway is a 67 y.o. female is here for a screening colonoscopy.   Past Medical History:  Diagnosis Date  . Depression   . Fibromyalgia   . Hyperlipidemia   . Insomnia   . PONV (postoperative nausea and vomiting)   . TMJ (temporomandibular joint disorder)   . Uterine fibroid   . Wears contact lenses   . Wears dentures    full upper, partial lower    Past Surgical History:  Procedure Laterality Date  . ABDOMINAL HYSTERECTOMY    . COLONOSCOPY  05/20/2012   repeat 5 years  . laparoscopic gyn procedure     done before her hysterectomy  . OOPHORECTOMY Left     Prior to Admission medications   Medication Sig Start Date End Date Taking? Authorizing Provider  aspirin EC 81 MG tablet Take 81 mg by mouth daily.   Yes [provider]  citalopram (CELEXA) 40 MG tablet Take 1 tablet (40 mg total) by mouth daily. 04/02/18  Yes Johnson, Megan P, DO  gabapentin (NEURONTIN) 300 MG capsule Take 3 capsules (900 mg total) by mouth 2 (two) times daily. 04/02/18  Yes Johnson, Megan P, DO  loratadine (CLARITIN) 10 MG tablet Take 10 mg by mouth daily.   Yes [provider]  MEGARED OMEGA-3 KRILL OIL PO Take by mouth daily.   Yes [provider]  zolpidem (AMBIEN) 10 MG tablet Take 0.5 tablets (5 mg total) by mouth at bedtime as needed. 12/25/17  Yes Johnson, Megan P, DO  estradiol (ESTRACE) 0.1 MG/GM vaginal cream Place 1 Applicatorful vaginally at bedtime. Insert 1g nightly for 1 wk, then 1 g once weekly as maintenace 02/77/41   Copland, Alicia B, PA-C    Allergies as of 04/03/2018 - Review Complete 04/02/2018  Allergen Reaction Noted  . Sulfa antibiotics Rash 05/04/2014    Family History  Problem Relation Age of  Onset  . Heart disease Mother   . Hypertension Mother   . Depression Mother   . Osteoporosis Mother   . Colon polyps Mother   . Cirrhosis Mother        non alcoholic  . Liver disease Mother        fatty liver disease  . Heart disease Father   . Hypertension Father   . Colon polyps Father   . Stroke Father   . Heart disease Sister   . Hypertension Sister   . Colon polyps Sister   . Diabetes Sister   . Hypertension Daughter   . Colon polyps Daughter   . Heart attack Brother   . Hypertension Brother   . Heart attack Brother   . Hyperlipidemia Son     Social History   Socioeconomic History  . Marital status: Single    Spouse name: Not on file  . Number of children: Not on file  . Years of education: Not on file  . Highest education level: Not on file  Occupational History  . Not on file  Social Needs  . Financial resource strain: Not on file  . Food insecurity:    Worry: Not on file    Inability: Not on file  . Transportation needs:    Medical: Not on file    Non-medical: Not on file  Tobacco Use  . Smoking status: Never Smoker  . Smokeless tobacco: Never Used  Substance and Sexual Activity  . Alcohol use: Yes    Alcohol/week: 3.0 standard drinks    Types: 3 Glasses of wine per week  . Drug use: No  . Sexual activity: Yes    Birth control/protection: Surgical    Comment: Hysterectomy  Lifestyle  . Physical activity:    Days per week: Not on file    Minutes per session: Not on file  . Stress: Not on file  Relationships  . Social connections:    Talks on phone: Not on file    Gets together: Not on file    Attends religious service: Not on file    Active member of club or organization: Not on file    Attends meetings of clubs or organizations: Not on file    Relationship status: Not on file  . Intimate partner violence:    Fear of current or ex partner: Not on file    Emotionally abused: Not on file    Physically abused: Not on file    Forced sexual  activity: Not on file  Other Topics Concern  . Not on file  Social History Narrative  . Not on file    Review of Systems: See HPI, otherwise negative ROS  Physical Exam: BP (!) 115/56   Pulse 77   Temp (!) 97.3 F (36.3 C) (Temporal)   Resp 16   Ht 5\' 3"  (1.6 m)   Wt 56.7 kg   SpO2 98%   BMI 22.14 kg/m  General:   Alert,  pleasant and cooperative in NAD Head:  Normocephalic and atraumatic. Neck:  Supple; no masses or thyromegaly. Lungs:  Clear throughout to auscultation.    Heart:  Regular rate and rhythm. Abdomen:  Soft, nontender and nondistended. Normal bowel sounds, without guarding, and without rebound.   Neurologic:  Alert and  oriented x4;  grossly normal neurologically.  Impression/Plan: Kathryn Conway is now here to undergo a screening colonoscopy.  Risks, benefits, and alternatives regarding colonoscopy have been reviewed with the patient.  Questions have been answered.  All parties agreeable.

## 2018-04-26 NOTE — Anesthesia Postprocedure Evaluation (Signed)
Anesthesia Post Note  Patient: Kathryn Conway  Procedure(s) Performed: COLONOSCOPY WITH PROPOFOL (N/A Rectum)  Patient location during evaluation: PACU Anesthesia Type: General Level of consciousness: awake and alert and oriented Pain management: satisfactory to patient Vital Signs Assessment: post-procedure vital signs reviewed and stable Respiratory status: spontaneous breathing, nonlabored ventilation and respiratory function stable Cardiovascular status: blood pressure returned to baseline and stable Postop Assessment: Adequate PO intake and No signs of nausea or vomiting Anesthetic complications: no    Raliegh Ip

## 2018-04-26 NOTE — Transfer of Care (Signed)
Immediate Anesthesia Transfer of Care Note  Patient: Kathryn Conway  Procedure(s) Performed: COLONOSCOPY WITH PROPOFOL (N/A Rectum)  Patient Location: PACU  Anesthesia Type: General  Level of Consciousness: awake, alert  and patient cooperative  Airway and Oxygen Therapy: Patient Spontanous Breathing and Patient connected to supplemental oxygen  Post-op Assessment: Post-op Vital signs reviewed, Patient's Cardiovascular Status Stable, Respiratory Function Stable, Patent Airway and No signs of Nausea or vomiting  Post-op Vital Signs: Reviewed and stable  Complications: No apparent anesthesia complications

## 2018-04-26 NOTE — Anesthesia Procedure Notes (Signed)
Performed by: Caasi Giglia, CRNA Pre-anesthesia Checklist: Patient identified, Emergency Drugs available, Suction available, Timeout performed and Patient being monitored Patient Re-evaluated:Patient Re-evaluated prior to induction Oxygen Delivery Method: Nasal cannula Placement Confirmation: positive ETCO2       

## 2018-04-26 NOTE — Op Note (Signed)
[facility] Gastroenterology Patient Name: Kathryn Conway Procedure Date: 04/26/2018 9:52 AM MRN: [id number] Account #: [id number] Date of Birth: 22-Jun-1951 Admit Type: Outpatient Age: 67 Room: [facility] OR ROOM 01 Gender: Female Note Status: Finalized Procedure:            Colonoscopy Indications:          Screening for colorectal malignant neoplasm Providers:            Lucilla Lame MD, MD Referring MD:         Valerie Roys (Referring MD) Medicines:            Propofol per Anesthesia Complications:        No immediate complications. Procedure:            Pre-Anesthesia Assessment:                       - Prior to the procedure, a History and Physical was                        performed, and patient medications and allergies were                        reviewed. The patient's tolerance of previous                        anesthesia was also reviewed. The risks and benefits of                        the procedure and the sedation options and risks were                        discussed with the patient. All questions were                        answered, and informed consent was obtained. Prior                        Anticoagulants: The patient has taken no previous                        anticoagulant or antiplatelet agents. ASA Grade                        Assessment: II - A patient with mild systemic disease.                        After reviewing the risks and benefits, the patient was                        deemed in satisfactory condition to undergo the                        procedure.                       After obtaining informed consent, the colonoscope was                        passed under direct vision. Throughout the procedure,  the patient's blood pressure, pulse, and oxygen                        saturations were monitored continuously. The was                        introduced through the anus and advanced to the the                   cecum, identified by appendiceal orifice and ileocecal                        valve. The colonoscopy was performed without                        difficulty. The patient tolerated the procedure well.                        The quality of the bowel preparation was excellent. Findings:      The perianal and digital rectal examinations were normal.      The entire examined colon appeared normal. Impression:           - The entire examined colon is normal.                       - No specimens collected. Recommendation:       - Discharge patient to home.                       - Resume previous diet.                       - Continue present medications.                       - Repeat colonoscopy in 10 years for screening unless                        any change in family history or lower GI problems. Procedure Code(s):    --- Professional ---                       [id number], Colonoscopy, flexible; diagnostic, including                        collection of specimen(s) by brushing or washing, when                        performed (separate procedure) Diagnosis Code(s):    --- Professional ---                       Z12.11, Encounter for screening for malignant neoplasm                        of colon CPT copyright 2018 American Medical Association. All rights reserved. The codes documented in this report are preliminary and upon coder review may  be revised to meet current compliance requirements. Lucilla Lame MD, MD 04/26/2018 10:23:11 AM This report has been signed electronically. Number of Addenda: 0 Note Initiated On: 04/26/2018 9:52 AM Scope Withdrawal Time: 0 hours 7 minutes 13 seconds  Total Procedure Duration: 0  hours 11 minutes 42 seconds       [facility]

## 2018-04-29 ENCOUNTER — Encounter: Payer: Self-pay | Admitting: Gastroenterology

## 2018-04-29 ENCOUNTER — Other Ambulatory Visit: Payer: Self-pay

## 2018-04-29 DIAGNOSIS — M9903 Segmental and somatic dysfunction of lumbar region: Secondary | ICD-10-CM | POA: Diagnosis not present

## 2018-04-29 DIAGNOSIS — M7918 Myalgia, other site: Secondary | ICD-10-CM | POA: Diagnosis not present

## 2018-04-29 DIAGNOSIS — M5137 Other intervertebral disc degeneration, lumbosacral region: Secondary | ICD-10-CM | POA: Diagnosis not present

## 2018-04-29 DIAGNOSIS — M5417 Radiculopathy, lumbosacral region: Secondary | ICD-10-CM | POA: Diagnosis not present

## 2018-04-29 DIAGNOSIS — M5416 Radiculopathy, lumbar region: Secondary | ICD-10-CM | POA: Diagnosis not present

## 2018-04-29 DIAGNOSIS — M5136 Other intervertebral disc degeneration, lumbar region: Secondary | ICD-10-CM | POA: Diagnosis not present

## 2018-04-29 DIAGNOSIS — M545 Low back pain: Secondary | ICD-10-CM | POA: Diagnosis not present

## 2018-04-29 DIAGNOSIS — M9904 Segmental and somatic dysfunction of sacral region: Secondary | ICD-10-CM | POA: Diagnosis not present

## 2018-04-29 NOTE — Telephone Encounter (Signed)
Last visit: 04/02/2018

## 2018-04-29 NOTE — Telephone Encounter (Signed)
She was given 30 tabs with 5 refills in June. She should not be due until at least December, and she's only suppose to be taking 1/2 tab daily, which means she should not be due until the spring.

## 2018-04-30 NOTE — Telephone Encounter (Signed)
Patient states she is fine, [facility] did that last time with her Gabapentin because she had to switch pharmacies.  Patient states she has her medication and does only take 1/2. Patient stated she should be fine until December. It was just something the pharmacy did.   Exaplained to patient to call us if she needed anything else. Routing to provider as Juluis Rainier.

## 2018-05-01 DIAGNOSIS — M9903 Segmental and somatic dysfunction of lumbar region: Secondary | ICD-10-CM | POA: Diagnosis not present

## 2018-05-01 DIAGNOSIS — M9904 Segmental and somatic dysfunction of sacral region: Secondary | ICD-10-CM | POA: Diagnosis not present

## 2018-05-01 DIAGNOSIS — M5137 Other intervertebral disc degeneration, lumbosacral region: Secondary | ICD-10-CM | POA: Diagnosis not present

## 2018-05-01 DIAGNOSIS — M545 Low back pain: Secondary | ICD-10-CM | POA: Diagnosis not present

## 2018-05-01 DIAGNOSIS — M5417 Radiculopathy, lumbosacral region: Secondary | ICD-10-CM | POA: Diagnosis not present

## 2018-05-01 DIAGNOSIS — M5136 Other intervertebral disc degeneration, lumbar region: Secondary | ICD-10-CM | POA: Diagnosis not present

## 2018-05-01 DIAGNOSIS — M5416 Radiculopathy, lumbar region: Secondary | ICD-10-CM | POA: Diagnosis not present

## 2018-05-01 DIAGNOSIS — M7918 Myalgia, other site: Secondary | ICD-10-CM | POA: Diagnosis not present

## 2018-05-02 DIAGNOSIS — M545 Low back pain: Secondary | ICD-10-CM | POA: Diagnosis not present

## 2018-05-02 DIAGNOSIS — M9904 Segmental and somatic dysfunction of sacral region: Secondary | ICD-10-CM | POA: Diagnosis not present

## 2018-05-02 DIAGNOSIS — M9903 Segmental and somatic dysfunction of lumbar region: Secondary | ICD-10-CM | POA: Diagnosis not present

## 2018-05-02 DIAGNOSIS — M5136 Other intervertebral disc degeneration, lumbar region: Secondary | ICD-10-CM | POA: Diagnosis not present

## 2018-05-02 DIAGNOSIS — M5137 Other intervertebral disc degeneration, lumbosacral region: Secondary | ICD-10-CM | POA: Diagnosis not present

## 2018-05-02 DIAGNOSIS — M7918 Myalgia, other site: Secondary | ICD-10-CM | POA: Diagnosis not present

## 2018-05-02 DIAGNOSIS — M5416 Radiculopathy, lumbar region: Secondary | ICD-10-CM | POA: Diagnosis not present

## 2018-05-02 DIAGNOSIS — M5417 Radiculopathy, lumbosacral region: Secondary | ICD-10-CM | POA: Diagnosis not present

## 2018-05-06 DIAGNOSIS — M9903 Segmental and somatic dysfunction of lumbar region: Secondary | ICD-10-CM | POA: Diagnosis not present

## 2018-05-06 DIAGNOSIS — M5136 Other intervertebral disc degeneration, lumbar region: Secondary | ICD-10-CM | POA: Diagnosis not present

## 2018-05-06 DIAGNOSIS — M9904 Segmental and somatic dysfunction of sacral region: Secondary | ICD-10-CM | POA: Diagnosis not present

## 2018-05-06 DIAGNOSIS — M545 Low back pain: Secondary | ICD-10-CM | POA: Diagnosis not present

## 2018-05-06 DIAGNOSIS — M5417 Radiculopathy, lumbosacral region: Secondary | ICD-10-CM | POA: Diagnosis not present

## 2018-05-06 DIAGNOSIS — M5137 Other intervertebral disc degeneration, lumbosacral region: Secondary | ICD-10-CM | POA: Diagnosis not present

## 2018-05-06 DIAGNOSIS — M5416 Radiculopathy, lumbar region: Secondary | ICD-10-CM | POA: Diagnosis not present

## 2018-05-06 DIAGNOSIS — M7918 Myalgia, other site: Secondary | ICD-10-CM | POA: Diagnosis not present

## 2018-05-09 DIAGNOSIS — M5416 Radiculopathy, lumbar region: Secondary | ICD-10-CM | POA: Diagnosis not present

## 2018-05-09 DIAGNOSIS — M9903 Segmental and somatic dysfunction of lumbar region: Secondary | ICD-10-CM | POA: Diagnosis not present

## 2018-05-09 DIAGNOSIS — M5136 Other intervertebral disc degeneration, lumbar region: Secondary | ICD-10-CM | POA: Diagnosis not present

## 2018-05-09 DIAGNOSIS — M545 Low back pain: Secondary | ICD-10-CM | POA: Diagnosis not present

## 2018-05-09 DIAGNOSIS — M9904 Segmental and somatic dysfunction of sacral region: Secondary | ICD-10-CM | POA: Diagnosis not present

## 2018-05-09 DIAGNOSIS — M7918 Myalgia, other site: Secondary | ICD-10-CM | POA: Diagnosis not present

## 2018-05-09 DIAGNOSIS — M5417 Radiculopathy, lumbosacral region: Secondary | ICD-10-CM | POA: Diagnosis not present

## 2018-05-09 DIAGNOSIS — M5137 Other intervertebral disc degeneration, lumbosacral region: Secondary | ICD-10-CM | POA: Diagnosis not present

## 2018-05-13 DIAGNOSIS — M9904 Segmental and somatic dysfunction of sacral region: Secondary | ICD-10-CM | POA: Diagnosis not present

## 2018-05-13 DIAGNOSIS — M7918 Myalgia, other site: Secondary | ICD-10-CM | POA: Diagnosis not present

## 2018-05-13 DIAGNOSIS — M5416 Radiculopathy, lumbar region: Secondary | ICD-10-CM | POA: Diagnosis not present

## 2018-05-13 DIAGNOSIS — M545 Low back pain: Secondary | ICD-10-CM | POA: Diagnosis not present

## 2018-05-13 DIAGNOSIS — M5136 Other intervertebral disc degeneration, lumbar region: Secondary | ICD-10-CM | POA: Diagnosis not present

## 2018-05-13 DIAGNOSIS — M5137 Other intervertebral disc degeneration, lumbosacral region: Secondary | ICD-10-CM | POA: Diagnosis not present

## 2018-05-13 DIAGNOSIS — M5417 Radiculopathy, lumbosacral region: Secondary | ICD-10-CM | POA: Diagnosis not present

## 2018-05-13 DIAGNOSIS — M9903 Segmental and somatic dysfunction of lumbar region: Secondary | ICD-10-CM | POA: Diagnosis not present

## 2018-05-15 ENCOUNTER — Telehealth: Payer: Self-pay | Admitting: Family Medicine

## 2018-05-15 MED ORDER — ATORVASTATIN CALCIUM 40 MG PO TABS: 40.0000 mg | ORAL_TABLET | Freq: Every day | ORAL | 3 refills | Status: DC

## 2018-05-15 NOTE — Telephone Encounter (Signed)
Patient states she is ready to start on cholesterol medication.  She said to send it in to [facility].  She states she just had another brother have a heart attack and decided she might need to go ahead and be put on medication since the last visit she was told she may need to do so.  [facility]  Thank you

## 2018-05-15 NOTE — Telephone Encounter (Signed)
Rx sent to her pharmacy, please have her come back in for an appointment in about a month for [place] to recheck her levels and check her tolerance to the medicine.

## 2018-05-16 DIAGNOSIS — M5137 Other intervertebral disc degeneration, lumbosacral region: Secondary | ICD-10-CM | POA: Diagnosis not present

## 2018-05-16 DIAGNOSIS — M9904 Segmental and somatic dysfunction of sacral region: Secondary | ICD-10-CM | POA: Diagnosis not present

## 2018-05-16 DIAGNOSIS — M9903 Segmental and somatic dysfunction of lumbar region: Secondary | ICD-10-CM | POA: Diagnosis not present

## 2018-05-16 DIAGNOSIS — M5136 Other intervertebral disc degeneration, lumbar region: Secondary | ICD-10-CM | POA: Diagnosis not present

## 2018-05-16 DIAGNOSIS — M7918 Myalgia, other site: Secondary | ICD-10-CM | POA: Diagnosis not present

## 2018-05-16 DIAGNOSIS — M5417 Radiculopathy, lumbosacral region: Secondary | ICD-10-CM | POA: Diagnosis not present

## 2018-05-16 DIAGNOSIS — M545 Low back pain: Secondary | ICD-10-CM | POA: Diagnosis not present

## 2018-05-16 DIAGNOSIS — M5416 Radiculopathy, lumbar region: Secondary | ICD-10-CM | POA: Diagnosis not present

## 2018-05-16 NOTE — Telephone Encounter (Signed)
Left message on machine for pt to return call to the office.  

## 2018-05-16 NOTE — Telephone Encounter (Signed)
CRM created. 

## 2018-05-17 NOTE — Telephone Encounter (Signed)
Called and spoke to patient. She states that she got her medication yesterday and scheduled an appointment in December.

## 2018-05-20 DIAGNOSIS — M5136 Other intervertebral disc degeneration, lumbar region: Secondary | ICD-10-CM | POA: Diagnosis not present

## 2018-05-20 DIAGNOSIS — M9903 Segmental and somatic dysfunction of lumbar region: Secondary | ICD-10-CM | POA: Diagnosis not present

## 2018-05-20 DIAGNOSIS — M5137 Other intervertebral disc degeneration, lumbosacral region: Secondary | ICD-10-CM | POA: Diagnosis not present

## 2018-05-20 DIAGNOSIS — M545 Low back pain: Secondary | ICD-10-CM | POA: Diagnosis not present

## 2018-05-20 DIAGNOSIS — M5416 Radiculopathy, lumbar region: Secondary | ICD-10-CM | POA: Diagnosis not present

## 2018-05-20 DIAGNOSIS — M9904 Segmental and somatic dysfunction of sacral region: Secondary | ICD-10-CM | POA: Diagnosis not present

## 2018-05-20 DIAGNOSIS — M7918 Myalgia, other site: Secondary | ICD-10-CM | POA: Diagnosis not present

## 2018-05-20 DIAGNOSIS — M5417 Radiculopathy, lumbosacral region: Secondary | ICD-10-CM | POA: Diagnosis not present

## 2018-05-23 DIAGNOSIS — M9903 Segmental and somatic dysfunction of lumbar region: Secondary | ICD-10-CM | POA: Diagnosis not present

## 2018-05-23 DIAGNOSIS — M9904 Segmental and somatic dysfunction of sacral region: Secondary | ICD-10-CM | POA: Diagnosis not present

## 2018-05-23 DIAGNOSIS — M545 Low back pain: Secondary | ICD-10-CM | POA: Diagnosis not present

## 2018-05-23 DIAGNOSIS — M5416 Radiculopathy, lumbar region: Secondary | ICD-10-CM | POA: Diagnosis not present

## 2018-05-23 DIAGNOSIS — M5136 Other intervertebral disc degeneration, lumbar region: Secondary | ICD-10-CM | POA: Diagnosis not present

## 2018-05-23 DIAGNOSIS — M7918 Myalgia, other site: Secondary | ICD-10-CM | POA: Diagnosis not present

## 2018-05-23 DIAGNOSIS — M5417 Radiculopathy, lumbosacral region: Secondary | ICD-10-CM | POA: Diagnosis not present

## 2018-05-23 DIAGNOSIS — M5137 Other intervertebral disc degeneration, lumbosacral region: Secondary | ICD-10-CM | POA: Diagnosis not present

## 2018-05-27 DIAGNOSIS — M5416 Radiculopathy, lumbar region: Secondary | ICD-10-CM | POA: Diagnosis not present

## 2018-05-27 DIAGNOSIS — M9903 Segmental and somatic dysfunction of lumbar region: Secondary | ICD-10-CM | POA: Diagnosis not present

## 2018-05-27 DIAGNOSIS — M5417 Radiculopathy, lumbosacral region: Secondary | ICD-10-CM | POA: Diagnosis not present

## 2018-05-27 DIAGNOSIS — M5137 Other intervertebral disc degeneration, lumbosacral region: Secondary | ICD-10-CM | POA: Diagnosis not present

## 2018-05-27 DIAGNOSIS — M5136 Other intervertebral disc degeneration, lumbar region: Secondary | ICD-10-CM | POA: Diagnosis not present

## 2018-05-27 DIAGNOSIS — M7918 Myalgia, other site: Secondary | ICD-10-CM | POA: Diagnosis not present

## 2018-05-27 DIAGNOSIS — M545 Low back pain: Secondary | ICD-10-CM | POA: Diagnosis not present

## 2018-05-27 DIAGNOSIS — M9904 Segmental and somatic dysfunction of sacral region: Secondary | ICD-10-CM | POA: Diagnosis not present

## 2018-06-03 DIAGNOSIS — M5417 Radiculopathy, lumbosacral region: Secondary | ICD-10-CM | POA: Diagnosis not present

## 2018-06-03 DIAGNOSIS — M9903 Segmental and somatic dysfunction of lumbar region: Secondary | ICD-10-CM | POA: Diagnosis not present

## 2018-06-03 DIAGNOSIS — M5136 Other intervertebral disc degeneration, lumbar region: Secondary | ICD-10-CM | POA: Diagnosis not present

## 2018-06-03 DIAGNOSIS — M7918 Myalgia, other site: Secondary | ICD-10-CM | POA: Diagnosis not present

## 2018-06-03 DIAGNOSIS — M545 Low back pain: Secondary | ICD-10-CM | POA: Diagnosis not present

## 2018-06-03 DIAGNOSIS — M5137 Other intervertebral disc degeneration, lumbosacral region: Secondary | ICD-10-CM | POA: Diagnosis not present

## 2018-06-03 DIAGNOSIS — M9904 Segmental and somatic dysfunction of sacral region: Secondary | ICD-10-CM | POA: Diagnosis not present

## 2018-06-03 DIAGNOSIS — M5416 Radiculopathy, lumbar region: Secondary | ICD-10-CM | POA: Diagnosis not present

## 2018-06-06 DIAGNOSIS — M5417 Radiculopathy, lumbosacral region: Secondary | ICD-10-CM | POA: Diagnosis not present

## 2018-06-06 DIAGNOSIS — M5136 Other intervertebral disc degeneration, lumbar region: Secondary | ICD-10-CM | POA: Diagnosis not present

## 2018-06-06 DIAGNOSIS — M5137 Other intervertebral disc degeneration, lumbosacral region: Secondary | ICD-10-CM | POA: Diagnosis not present

## 2018-06-06 DIAGNOSIS — M545 Low back pain: Secondary | ICD-10-CM | POA: Diagnosis not present

## 2018-06-06 DIAGNOSIS — M5416 Radiculopathy, lumbar region: Secondary | ICD-10-CM | POA: Diagnosis not present

## 2018-06-06 DIAGNOSIS — M9904 Segmental and somatic dysfunction of sacral region: Secondary | ICD-10-CM | POA: Diagnosis not present

## 2018-06-06 DIAGNOSIS — M9903 Segmental and somatic dysfunction of lumbar region: Secondary | ICD-10-CM | POA: Diagnosis not present

## 2018-06-06 DIAGNOSIS — M7918 Myalgia, other site: Secondary | ICD-10-CM | POA: Diagnosis not present

## 2018-06-10 DIAGNOSIS — M5417 Radiculopathy, lumbosacral region: Secondary | ICD-10-CM | POA: Diagnosis not present

## 2018-06-10 DIAGNOSIS — M5136 Other intervertebral disc degeneration, lumbar region: Secondary | ICD-10-CM | POA: Diagnosis not present

## 2018-06-10 DIAGNOSIS — M9904 Segmental and somatic dysfunction of sacral region: Secondary | ICD-10-CM | POA: Diagnosis not present

## 2018-06-10 DIAGNOSIS — M545 Low back pain: Secondary | ICD-10-CM | POA: Diagnosis not present

## 2018-06-10 DIAGNOSIS — M5416 Radiculopathy, lumbar region: Secondary | ICD-10-CM | POA: Diagnosis not present

## 2018-06-10 DIAGNOSIS — M7918 Myalgia, other site: Secondary | ICD-10-CM | POA: Diagnosis not present

## 2018-06-10 DIAGNOSIS — M5137 Other intervertebral disc degeneration, lumbosacral region: Secondary | ICD-10-CM | POA: Diagnosis not present

## 2018-06-10 DIAGNOSIS — M9903 Segmental and somatic dysfunction of lumbar region: Secondary | ICD-10-CM | POA: Diagnosis not present

## 2018-06-18 DIAGNOSIS — M5136 Other intervertebral disc degeneration, lumbar region: Secondary | ICD-10-CM | POA: Diagnosis not present

## 2018-06-18 DIAGNOSIS — M9904 Segmental and somatic dysfunction of sacral region: Secondary | ICD-10-CM | POA: Diagnosis not present

## 2018-06-18 DIAGNOSIS — M5416 Radiculopathy, lumbar region: Secondary | ICD-10-CM | POA: Diagnosis not present

## 2018-06-18 DIAGNOSIS — M5137 Other intervertebral disc degeneration, lumbosacral region: Secondary | ICD-10-CM | POA: Diagnosis not present

## 2018-06-18 DIAGNOSIS — M7918 Myalgia, other site: Secondary | ICD-10-CM | POA: Diagnosis not present

## 2018-06-18 DIAGNOSIS — M545 Low back pain: Secondary | ICD-10-CM | POA: Diagnosis not present

## 2018-06-18 DIAGNOSIS — M9903 Segmental and somatic dysfunction of lumbar region: Secondary | ICD-10-CM | POA: Diagnosis not present

## 2018-06-18 DIAGNOSIS — M5417 Radiculopathy, lumbosacral region: Secondary | ICD-10-CM | POA: Diagnosis not present

## 2018-06-20 DIAGNOSIS — M5417 Radiculopathy, lumbosacral region: Secondary | ICD-10-CM | POA: Diagnosis not present

## 2018-06-20 DIAGNOSIS — M7918 Myalgia, other site: Secondary | ICD-10-CM | POA: Diagnosis not present

## 2018-06-20 DIAGNOSIS — M545 Low back pain: Secondary | ICD-10-CM | POA: Diagnosis not present

## 2018-06-20 DIAGNOSIS — M9904 Segmental and somatic dysfunction of sacral region: Secondary | ICD-10-CM | POA: Diagnosis not present

## 2018-06-20 DIAGNOSIS — M5416 Radiculopathy, lumbar region: Secondary | ICD-10-CM | POA: Diagnosis not present

## 2018-06-20 DIAGNOSIS — M5137 Other intervertebral disc degeneration, lumbosacral region: Secondary | ICD-10-CM | POA: Diagnosis not present

## 2018-06-20 DIAGNOSIS — M9903 Segmental and somatic dysfunction of lumbar region: Secondary | ICD-10-CM | POA: Diagnosis not present

## 2018-06-20 DIAGNOSIS — M5136 Other intervertebral disc degeneration, lumbar region: Secondary | ICD-10-CM | POA: Diagnosis not present

## 2018-06-21 ENCOUNTER — Ambulatory Visit (INDEPENDENT_AMBULATORY_CARE_PROVIDER_SITE_OTHER): Payer: Medicare HMO | Admitting: Family Medicine

## 2018-06-21 ENCOUNTER — Encounter: Payer: Self-pay | Admitting: Family Medicine

## 2018-06-21 ENCOUNTER — Other Ambulatory Visit: Payer: Self-pay

## 2018-06-21 VITALS — BP 117/76 | HR 79 | Temp 98.3°F | Ht 63.0 in | Wt 130.0 lb

## 2018-06-21 DIAGNOSIS — E7800 Pure hypercholesterolemia, unspecified: Secondary | ICD-10-CM | POA: Diagnosis not present

## 2018-06-21 MED ORDER — ATORVASTATIN CALCIUM 40 MG PO TABS: 40.0000 mg | ORAL_TABLET | Freq: Every day | ORAL | 1 refills | Status: DC

## 2018-06-21 MED ORDER — ATORVASTATIN CALCIUM 40 MG PO TABS: 40.0000 mg | ORAL_TABLET | Freq: Every day | ORAL | 3 refills | Status: DC

## 2018-06-21 NOTE — Assessment & Plan Note (Signed)
Tolerating atorvastatin well. Will continue current regimen and call with any concerns. Checking lipids and CMP today. Refills given.

## 2018-06-21 NOTE — Progress Notes (Signed)
BP 117/76   Pulse 79   Temp 98.3 F (36.8 C) (Oral)   Ht 5\' 3"  (1.6 m)   Wt 130 lb (59 kg)   SpO2 96%   BMI 23.03 kg/m    Subjective:    Patient ID: Kathryn Conway, female    DOB: Jul 21, 1950, 67 y.o.   MRN: [id number]  HPI: Kathryn Conway is a 67 y.o. female  Chief Complaint  Patient presents with  . Hyperlipidemia    f/u   HYPERLIPIDEMIA Hyperlipidemia status: excellent compliance Satisfied with current treatment?  yes Side effects:  no Medication compliance: excellent compliance Past cholesterol meds: atorvastatin Supplements: none Aspirin:  yes The 10-year ASCVD risk score [name] DC Jr., et al., 2013) is: 5%   Values used to calculate the score:     Age: 67 years     Sex: Female     Is Non-Hispanic African American: No     Diabetic: No     Tobacco smoker: No     Systolic Blood Pressure: 701 mmHg     Is BP treated: No     HDL Cholesterol: 67 mg/dL     Total Cholesterol: 268 mg/dL Chest pain:  no Coronary artery disease:  no Family history CAD:  yes Family history early CAD:  yes  Relevant past medical, surgical, family and social history reviewed and updated as indicated. Interim medical history since our last visit reviewed. Allergies and medications reviewed and updated.  Review of Systems  Constitutional: Negative.   Respiratory: Negative.   Cardiovascular: Negative.   Psychiatric/Behavioral: Negative.     Per HPI unless specifically indicated above     Objective:    BP 117/76   Pulse 79   Temp 98.3 F (36.8 C) (Oral)   Ht 5\' 3"  (1.6 m)   Wt 130 lb (59 kg)   SpO2 96%   BMI 23.03 kg/m   Wt Readings from Last 3 Encounters:  06/21/18 130 lb (59 kg)  04/26/18 125 lb (56.7 kg)  04/18/18 132 lb (59.9 kg)    Physical Exam Vitals signs and nursing note reviewed.  Constitutional:      General: She is not in acute distress.    Appearance: Normal appearance. She is not ill-appearing, toxic-appearing or diaphoretic.  HENT:     Head:  Normocephalic and atraumatic.     Right Ear: External ear normal.     Left Ear: External ear normal.     Nose: Nose normal.     Mouth/Throat:     Mouth: Mucous membranes are moist.     Pharynx: Oropharynx is clear.  Eyes:     General: No scleral icterus.       Right eye: No discharge.        Left eye: No discharge.     Extraocular Movements: Extraocular movements intact.     Conjunctiva/sclera: Conjunctivae normal.     Pupils: Pupils are equal, round, and reactive to light.  Neck:     Musculoskeletal: Normal range of motion and neck supple.  Cardiovascular:     Rate and Rhythm: Normal rate and regular rhythm.     Pulses: Normal pulses.     Heart sounds: Normal heart sounds. No murmur. No friction rub. No gallop.   Pulmonary:     Effort: Pulmonary effort is normal. No respiratory distress.     Breath sounds: Normal breath sounds. No stridor. No wheezing, rhonchi or rales.  Chest:     Chest wall: No  tenderness.  Musculoskeletal: Normal range of motion.  Skin:    General: Skin is warm and dry.     Capillary Refill: Capillary refill takes less than 2 seconds.     Coloration: Skin is not jaundiced or pale.     Findings: No bruising, erythema, lesion or rash.  Neurological:     General: No focal deficit present.     Mental Status: She is alert and oriented to person, place, and time. Mental status is at baseline.  Psychiatric:        Mood and Affect: Mood normal.        Behavior: Behavior normal.        Thought Content: Thought content normal.        Judgment: Judgment normal.     Results for orders placed or performed in visit on 04/02/18  Microscopic Examination  Result Value Ref Range   WBC, UA 0-5 0 - 5 /hpf   RBC, UA 0-2 0 - 2 /hpf   Epithelial Cells (non renal) 0-10 0 - 10 /hpf   Renal Epithel, UA 0-10 (A) None seen /hpf   Bacteria, UA Few None seen/Few  Bayer DCA Hb A1c Waived  Result Value Ref Range   HB A1C (BAYER DCA - WAIVED) 5.0 <7.0 %  CBC with  Differential/Platelet  Result Value Ref Range   WBC 7.4 3.4 - 10.8 x10E3/uL   RBC 3.80 3.77 - 5.28 x10E6/uL   Hemoglobin 11.6 11.1 - 15.9 g/dL   Hematocrit 35.6 34.0 - 46.6 %   MCV 94 79 - 97 fL   MCH 30.5 26.6 - 33.0 pg   MCHC 32.6 31.5 - 35.7 g/dL   RDW 13.1 12.3 - 15.4 %   Platelets 279 150 - 450 x10E3/uL   Neutrophils 55 Not Estab. %   Lymphs 36 Not Estab. %   Monocytes 7 Not Estab. %   Eos 2 Not Estab. %   Basos 0 Not Estab. %   Neutrophils Absolute 4.0 1.4 - 7.0 x10E3/uL   Lymphocytes Absolute 2.7 0.7 - 3.1 x10E3/uL   Monocytes Absolute 0.5 0.1 - 0.9 x10E3/uL   EOS (ABSOLUTE) 0.2 0.0 - 0.4 x10E3/uL   Basophils Absolute 0.0 0.0 - 0.2 x10E3/uL   Immature Granulocytes 0 Not Estab. %   Immature Grans (Abs) 0.0 0.0 - 0.1 x10E3/uL  Comprehensive metabolic panel  Result Value Ref Range   Glucose 89 65 - 99 mg/dL   BUN 16 8 - 27 mg/dL   Creatinine, Ser 0.71 0.57 - 1.00 mg/dL   GFR calc non Af Amer 90 >59 mL/min/1.73   GFR calc Af Amer 103 >59 mL/min/1.73   BUN/Creatinine Ratio 23 12 - 28   Sodium 140 134 - 144 mmol/L   Potassium 4.0 3.5 - 5.2 mmol/L   Chloride 100 96 - 106 mmol/L   CO2 23 20 - 29 mmol/L   Calcium 9.1 8.7 - 10.3 mg/dL   Total Protein 7.0 6.0 - 8.5 g/dL   Albumin 4.4 3.6 - 4.8 g/dL   Globulin, Total 2.6 1.5 - 4.5 g/dL   Albumin/Globulin Ratio 1.7 1.2 - 2.2   Bilirubin Total <0.2 0.0 - 1.2 mg/dL   Alkaline Phosphatase 72 39 - 117 IU/L   AST 14 0 - 40 IU/L   ALT 10 0 - 32 IU/L  Lipid Panel w/o Chol/HDL Ratio  Result Value Ref Range   Cholesterol, Total 268 (H) 100 - 199 mg/dL   Triglycerides 122 0 - 149 mg/dL  HDL 67 >39 mg/dL   VLDL Cholesterol Cal 24 5 - 40 mg/dL   LDL Calculated 177 (H) 0 - 99 mg/dL  TSH  Result Value Ref Range   TSH 2.810 0.450 - 4.500 uIU/mL  UA/M w/rflx Culture, Routine  Result Value Ref Range   Specific Gravity, UA 1.010 1.005 - 1.030   pH, UA 5.5 5.0 - 7.5   Color, UA Straw Yellow   Appearance Ur Clear Clear    Leukocytes, UA Trace (A) Negative   Protein, UA Negative Negative/Trace   Glucose, UA Negative Negative   Ketones, UA Negative Negative   RBC, UA Trace (A) Negative   Bilirubin, UA Negative Negative   Urobilinogen, Ur 0.2 0.2 - 1.0 mg/dL   Nitrite, UA Negative Negative   Microscopic Examination See below:   HIV Antibody (routine testing w rflx)  Result Value Ref Range   HIV Screen 4th Generation wRfx Non Reactive Non Reactive  Hepatitis C Antibody  Result Value Ref Range   Hep C Virus Ab <0.1 0.0 - 0.9 s/co ratio      Assessment & Plan:   Problem List Items Addressed This Visit      Other   Hypercholesterolemia - Primary    Tolerating atorvastatin well. Will continue current regimen and call with any concerns. Checking lipids and CMP today. Refills given.       Relevant Medications   atorvastatin (LIPITOR) 40 MG tablet   Other Relevant Orders   Comprehensive metabolic panel   Lipid Panel w/o Chol/HDL Ratio       Follow up plan: No follow-ups on file.

## 2018-06-22 LAB — COMPREHENSIVE METABOLIC PANEL
A/G RATIO: 2.3 — AB (ref 1.2–2.2)
ALT: 17 IU/L (ref 0–32)
AST: 20 IU/L (ref 0–40)
Albumin: 4.5 g/dL (ref 3.6–4.8)
Alkaline Phosphatase: 74 IU/L (ref 39–117)
BUN / CREAT RATIO: 19 (ref 12–28)
BUN: 13 mg/dL (ref 8–27)
Bilirubin Total: 0.2 mg/dL (ref 0.0–1.2)
CALCIUM: 9 mg/dL (ref 8.7–10.3)
CO2: 22 mmol/L (ref 20–29)
Chloride: 103 mmol/L (ref 96–106)
Creatinine, Ser: 0.69 mg/dL (ref 0.57–1.00)
GFR, EST AFRICAN AMERICAN: 106 mL/min/{1.73_m2} (ref >59)
GFR, EST NON AFRICAN AMERICAN: 92 mL/min/{1.73_m2} (ref >59)
Globulin, Total: 2 g/dL (ref 1.5–4.5)
Glucose: 107 mg/dL — ABNORMAL HIGH (ref 65–99)
POTASSIUM: 4.1 mmol/L (ref 3.5–5.2)
SODIUM: 141 mmol/L (ref 134–144)
TOTAL PROTEIN: 6.5 g/dL (ref 6.0–8.5)

## 2018-06-22 LAB — LIPID PANEL W/O CHOL/HDL RATIO
Cholesterol, Total: 158 mg/dL (ref 100–199)
HDL: 68 mg/dL (ref >39)
LDL CALC: 75 mg/dL (ref 0–99)
Triglycerides: 77 mg/dL (ref 0–149)
VLDL Cholesterol Cal: 15 mg/dL (ref 5–40)

## 2018-06-24 ENCOUNTER — Encounter: Payer: Self-pay | Admitting: Family Medicine

## 2018-07-04 ENCOUNTER — Other Ambulatory Visit: Payer: Self-pay | Admitting: Family Medicine

## 2018-07-04 MED ORDER — ZOLPIDEM TARTRATE 10 MG PO TABS: 5.0000 mg | ORAL_TABLET | Freq: Every evening | ORAL | 5 refills | Status: DC | PRN

## 2018-07-08 DIAGNOSIS — M545 Low back pain: Secondary | ICD-10-CM | POA: Diagnosis not present

## 2018-07-08 DIAGNOSIS — M9904 Segmental and somatic dysfunction of sacral region: Secondary | ICD-10-CM | POA: Diagnosis not present

## 2018-07-08 DIAGNOSIS — M5416 Radiculopathy, lumbar region: Secondary | ICD-10-CM | POA: Diagnosis not present

## 2018-07-08 DIAGNOSIS — M7918 Myalgia, other site: Secondary | ICD-10-CM | POA: Diagnosis not present

## 2018-07-08 DIAGNOSIS — M5417 Radiculopathy, lumbosacral region: Secondary | ICD-10-CM | POA: Diagnosis not present

## 2018-07-08 DIAGNOSIS — M5136 Other intervertebral disc degeneration, lumbar region: Secondary | ICD-10-CM | POA: Diagnosis not present

## 2018-07-08 DIAGNOSIS — M9903 Segmental and somatic dysfunction of lumbar region: Secondary | ICD-10-CM | POA: Diagnosis not present

## 2018-07-08 DIAGNOSIS — M5137 Other intervertebral disc degeneration, lumbosacral region: Secondary | ICD-10-CM | POA: Diagnosis not present

## 2018-07-11 DIAGNOSIS — M5416 Radiculopathy, lumbar region: Secondary | ICD-10-CM | POA: Diagnosis not present

## 2018-07-11 DIAGNOSIS — M7918 Myalgia, other site: Secondary | ICD-10-CM | POA: Diagnosis not present

## 2018-07-11 DIAGNOSIS — M5137 Other intervertebral disc degeneration, lumbosacral region: Secondary | ICD-10-CM | POA: Diagnosis not present

## 2018-07-11 DIAGNOSIS — M545 Low back pain: Secondary | ICD-10-CM | POA: Diagnosis not present

## 2018-07-11 DIAGNOSIS — M9903 Segmental and somatic dysfunction of lumbar region: Secondary | ICD-10-CM | POA: Diagnosis not present

## 2018-07-11 DIAGNOSIS — M9904 Segmental and somatic dysfunction of sacral region: Secondary | ICD-10-CM | POA: Diagnosis not present

## 2018-07-11 DIAGNOSIS — M5136 Other intervertebral disc degeneration, lumbar region: Secondary | ICD-10-CM | POA: Diagnosis not present

## 2018-07-11 DIAGNOSIS — M5417 Radiculopathy, lumbosacral region: Secondary | ICD-10-CM | POA: Diagnosis not present

## 2018-07-18 DIAGNOSIS — M5417 Radiculopathy, lumbosacral region: Secondary | ICD-10-CM | POA: Diagnosis not present

## 2018-07-18 DIAGNOSIS — M5136 Other intervertebral disc degeneration, lumbar region: Secondary | ICD-10-CM | POA: Diagnosis not present

## 2018-07-18 DIAGNOSIS — M9903 Segmental and somatic dysfunction of lumbar region: Secondary | ICD-10-CM | POA: Diagnosis not present

## 2018-07-18 DIAGNOSIS — M5416 Radiculopathy, lumbar region: Secondary | ICD-10-CM | POA: Diagnosis not present

## 2018-07-18 DIAGNOSIS — M7918 Myalgia, other site: Secondary | ICD-10-CM | POA: Diagnosis not present

## 2018-07-18 DIAGNOSIS — M9904 Segmental and somatic dysfunction of sacral region: Secondary | ICD-10-CM | POA: Diagnosis not present

## 2018-07-18 DIAGNOSIS — M5137 Other intervertebral disc degeneration, lumbosacral region: Secondary | ICD-10-CM | POA: Diagnosis not present

## 2018-07-18 DIAGNOSIS — M545 Low back pain: Secondary | ICD-10-CM | POA: Diagnosis not present

## 2018-09-10 ENCOUNTER — Telehealth: Payer: Self-pay | Admitting: Family Medicine

## 2018-09-10 NOTE — Telephone Encounter (Signed)
Spoke with pharmacy and they have the rest of the original prescription filled, told them to cancel the new script that was sent over today.

## 2018-09-10 NOTE — Telephone Encounter (Signed)
Refill request for gabapentin; record shows last refill 08/16/2018 #540; spoke with Tynia at [facility]; she states that the pt had the following refill dates and amounts; 04/20/2019 # 540;  she has # remaining; the pt would like a 90 day supply 2/14 #180; 1/17 #180; upcoming appointment 10/08/2018; will grant 30 day refill to cover pt until 10/08/2018 appointment. Requested Prescriptions  Pending Prescriptions Disp Refills  . gabapentin (NEURONTIN) 300 MG capsule [Pharmacy Med Name: GABAPENTIN 300 MG CAPSULE] 540 capsule 1    Sig: TAKE 3 CAPSULES (900 MG TOTAL) BY MOUTH 2 (TWO) TIMES DAILY.     Neurology: Anticonvulsants - gabapentin Passed - 09/10/2018  8:31 AM      Passed - Valid encounter within last 12 months    Recent Outpatient Visits          2 months ago Hypercholesterolemia   [place], Megan P, DO   5 months ago [facility] to [employer] preventive visit   [facility], Megan P, DO   8 months ago Recurrent major depressive disorder, in full remission [facility])   [place], Megan P, DO   1 year ago Primary insomnia   [place], DO   1 year ago Routine general medical examination at a health care facility   [place], Barb Merino, DO      Future Appointments            In 4 weeks Wynetta Emery, Barb Merino, DO [facility], [facility]

## 2018-09-10 NOTE — Telephone Encounter (Signed)
I'm not sure what this means, but patient should not be due for refill- was given a 90 day supply a month ago. She should not be out until May and is seeing me in April when she should get a refill. Can we find out what's going on? If they just wanted a refill on file- please cancel Rx from RN and I'll send it in for her next month when I see her, if she was out of medicine, she will need to be seen before next month.

## 2018-10-03 ENCOUNTER — Other Ambulatory Visit: Payer: Self-pay | Admitting: Family Medicine

## 2018-10-08 ENCOUNTER — Encounter: Payer: Self-pay | Admitting: Family Medicine

## 2018-10-08 ENCOUNTER — Other Ambulatory Visit: Payer: Self-pay

## 2018-10-08 ENCOUNTER — Ambulatory Visit (INDEPENDENT_AMBULATORY_CARE_PROVIDER_SITE_OTHER): Payer: Medicare HMO | Admitting: Family Medicine

## 2018-10-08 VITALS — Wt 134.0 lb

## 2018-10-08 DIAGNOSIS — R69 Illness, unspecified: Secondary | ICD-10-CM | POA: Diagnosis not present

## 2018-10-08 DIAGNOSIS — E7800 Pure hypercholesterolemia, unspecified: Secondary | ICD-10-CM

## 2018-10-08 DIAGNOSIS — F3342 Major depressive disorder, recurrent, in full remission: Secondary | ICD-10-CM | POA: Diagnosis not present

## 2018-10-08 MED ORDER — CITALOPRAM HYDROBROMIDE 40 MG PO TABS: 40.0000 mg | ORAL_TABLET | Freq: Every day | ORAL | 1 refills | Status: DC

## 2018-10-08 MED ORDER — ATORVASTATIN CALCIUM 40 MG PO TABS: 40.0000 mg | ORAL_TABLET | Freq: Every day | ORAL | 1 refills | Status: DC

## 2018-10-08 NOTE — Assessment & Plan Note (Signed)
Doing great. No symptoms. Continue celexa. Call with any concerns. Continue to monitor. Refills given today.

## 2018-10-08 NOTE — Assessment & Plan Note (Signed)
Under good control on current regimen. Continue current regimen. Continue to monitor. Call with any concerns. Refills given. No labs today- drawn in December and normal. Will check again in 6 months at physical.

## 2018-10-08 NOTE — Progress Notes (Signed)
Wt 134 lb (60.8 kg)   BMI 23.74 kg/m    Subjective:    Patient ID: Kathryn Conway, female    DOB: Aug 27, 1950, 68 y.o.   MRN: [id number]  HPI: Kathryn Conway is a 68 y.o. female  Chief Complaint  Patient presents with  . Depression  . Hyperlipidemia   DEPRESSION Mood status: controlled Satisfied with current treatment?: yes Symptom severity: mild  Duration of current treatment : chronic Side effects: no Medication compliance: excellent compliance Psychotherapy/counseling: no  Previous psychiatric medications: celexa Depressed mood: no Anxious mood: no Anhedonia: no Significant weight loss or gain: no Insomnia: no  Fatigue: no Feelings of worthlessness or guilt: no Impaired concentration/indecisiveness: no Suicidal ideations: no Hopelessness: no Crying spells: no Depression screen [facility] 2/9 10/08/2018 04/02/2018 12/25/2017 06/22/2017 06/22/2017  Decreased Interest 0 0 0 0 0  Down, Depressed, Hopeless 0 0 0 2 0  PHQ - 2 Score 0 0 0 2 0  Altered sleeping 0 3 2 2  -  Tired, decreased energy 0 2 2 1  -  Change in appetite 0 0 0 0 -  Feeling bad or failure about yourself  0 0 0 0 -  Trouble concentrating 0 0 0 0 -  Moving slowly or fidgety/restless 0 0 0 0 -  Suicidal thoughts 0 0 0 0 -  PHQ-9 Score 0 5 4 5  -  Difficult doing work/chores Not difficult at all Not difficult at all Not difficult at all - -   HYPERLIPIDEMIA Hyperlipidemia status: excellent compliance Satisfied with current treatment?  yes Side effects:  no Medication compliance: excellent compliance Past cholesterol meds: crestor, atorvastatin Supplements: none Aspirin:  yes The 10-year ASCVD risk score [name] DC Jr., et al., 2013) is: 3.8%   Values used to calculate the score:     Age: 37 years     Sex: Female     Is Non-Hispanic African American: No     Diabetic: No     Tobacco smoker: No     Systolic Blood Pressure: 382 mmHg     Is BP treated: No     HDL Cholesterol: 68 mg/dL     Total  Cholesterol: 158 mg/dL Chest pain:  no Coronary artery disease:  no Family history CAD:  yes  Relevant past medical, surgical, family and social history reviewed and updated as indicated. Interim medical history since our last visit reviewed. Allergies and medications reviewed and updated.  Review of Systems  Constitutional: Negative.   Respiratory: Negative.   Cardiovascular: Negative.   Neurological: Negative.   Psychiatric/Behavioral: Negative.     Per HPI unless specifically indicated above     Objective:    Wt 134 lb (60.8 kg)   BMI 23.74 kg/m   Wt Readings from Last 3 Encounters:  10/08/18 134 lb (60.8 kg)  06/21/18 130 lb (59 kg)  04/26/18 125 lb (56.7 kg)    Physical Exam Vitals signs and nursing note reviewed.  Constitutional:      General: She is not in acute distress.    Appearance: Normal appearance. She is not ill-appearing, toxic-appearing or diaphoretic.  HENT:     Head: Normocephalic and atraumatic.     Right Ear: External ear normal.     Left Ear: External ear normal.     Nose: Nose normal.     Mouth/Throat:     Mouth: Mucous membranes are moist.     Pharynx: Oropharynx is clear.  Eyes:     General: No scleral  icterus.       Right eye: No discharge.        Left eye: No discharge.     Conjunctiva/sclera: Conjunctivae normal.     Pupils: Pupils are equal, round, and reactive to light.  Neck:     Musculoskeletal: Normal range of motion.  Pulmonary:     Effort: Pulmonary effort is normal. No respiratory distress.     Comments: Speaking in full sentences Musculoskeletal: Normal range of motion.  Skin:    Coloration: Skin is not jaundiced or pale.     Findings: No bruising, erythema, lesion or rash.  Neurological:     Mental Status: She is alert and oriented to person, place, and time. Mental status is at baseline.  Psychiatric:        Mood and Affect: Mood normal.        Behavior: Behavior normal.        Thought Content: Thought content  normal.        Judgment: Judgment normal.     Results for orders placed or performed in visit on 06/21/18  Comprehensive metabolic panel  Result Value Ref Range   Glucose 107 (H) 65 - 99 mg/dL   BUN 13 8 - 27 mg/dL   Creatinine, Ser 0.69 0.57 - 1.00 mg/dL   GFR calc non Af Amer 92 >59 mL/min/1.73   GFR calc Af Amer 106 >59 mL/min/1.73   BUN/Creatinine Ratio 19 12 - 28   Sodium 141 134 - 144 mmol/L   Potassium 4.1 3.5 - 5.2 mmol/L   Chloride 103 96 - 106 mmol/L   CO2 22 20 - 29 mmol/L   Calcium 9.0 8.7 - 10.3 mg/dL   Total Protein 6.5 6.0 - 8.5 g/dL   Albumin 4.5 3.6 - 4.8 g/dL   Globulin, Total 2.0 1.5 - 4.5 g/dL   Albumin/Globulin Ratio 2.3 (H) 1.2 - 2.2   Bilirubin Total 0.2 0.0 - 1.2 mg/dL   Alkaline Phosphatase 74 39 - 117 IU/L   AST 20 0 - 40 IU/L   ALT 17 0 - 32 IU/L  Lipid Panel w/o Chol/HDL Ratio  Result Value Ref Range   Cholesterol, Total 158 100 - 199 mg/dL   Triglycerides 77 0 - 149 mg/dL   HDL 68 >39 mg/dL   VLDL Cholesterol Cal 15 5 - 40 mg/dL   LDL Calculated 75 0 - 99 mg/dL      Assessment & Plan:   Problem List Items Addressed This Visit      Other   Hypercholesterolemia - Primary    Under good control on current regimen. Continue current regimen. Continue to monitor. Call with any concerns. Refills given. No labs today- drawn in December and normal. Will check again in 6 months at physical.        Relevant Medications   atorvastatin (LIPITOR) 40 MG tablet   Recurrent major depressive disorder, in full remission [facility])    Doing great. No symptoms. Continue celexa. Call with any concerns. Continue to monitor. Refills given today.      Relevant Medications   citalopram (CELEXA) 40 MG tablet       Follow up plan: Return in about 6 months (around 04/09/2019) for Physical.    . This visit was completed via Skype due to the restrictions of the COVID-19 pandemic. All issues as above were discussed and addressed. Physical exam was done as above  through visual confirmation on Skype. If it was felt that the patient should be  evaluated in the office, they were directed there. The patient verbally consented to this visit. . Location of the patient: home . Location of the provider: work . Those involved with this call:  . Provider: Park Liter, DO . CMA: Tiffany Reel, CMA . Front Desk/Registration: Don Perking  . Time spent on call: 25 minutes with patient face to face via video conference. More than 50% of this time was spent in counseling and coordination of care.

## 2018-10-26 ENCOUNTER — Other Ambulatory Visit: Payer: Self-pay | Admitting: Family Medicine

## 2018-11-23 ENCOUNTER — Other Ambulatory Visit: Payer: Self-pay | Admitting: Family Medicine

## 2018-12-11 ENCOUNTER — Telehealth: Payer: Self-pay | Admitting: Family Medicine

## 2018-12-11 NOTE — Telephone Encounter (Signed)
Called and spoke to patient. Patient stated that she will [facility] to make sure they got the Rx gabapentin sent in on 10/26/2018. Pt stated that seems like the pharmacy is not talking about the same Rx as she has the one last refilled on 10/03/2018 for 180 capsules. Advised pt give [place] a call back if need anything. FYI

## 2018-12-11 NOTE — Telephone Encounter (Unsigned)
Copied from [id number] [phone] [phone]. Topic: Quick Communication - Rx Refill/Question >> Dec 11, 2018  8:44 AM Alanda Slim E wrote: Medication: gabapentin (NEURONTIN) 300 MG capsule  Has the patient contacted their pharmacy? Yes- Pharmacy advised Pt that request was denied. The Pt hadnt requested it when the pharmacy sent in a request. Pt is just running out of this med.   Preferred Pharmacy (with phone number or street name): [id number] - [place], [address] [phone] (Phone) [phone] (Fax)    Agent: Please be advised that RX refills may take up to 3 business days. We ask that you follow-up with your pharmacy.

## 2018-12-11 NOTE — Telephone Encounter (Signed)
78month supply given at the end of April. Should not be due until the end of July.

## 2018-12-24 ENCOUNTER — Encounter: Payer: Self-pay | Admitting: Family Medicine

## 2018-12-24 ENCOUNTER — Telehealth: Payer: Self-pay | Admitting: Family Medicine

## 2018-12-24 NOTE — Telephone Encounter (Signed)
Copied from [place] [phone]. Topic: General - Inquiry >> Dec 24, 2018  8:20 AM Richardo Priest, NT wrote: Reason for CRM: Patient called in requesting a note that states she has fiber myalgia so she can use a golf cart to get around the camp ground. Call back is [phone].

## 2018-12-24 NOTE — Telephone Encounter (Signed)
Called pt to let her know paper is ready. She states she will come after work today.

## 2018-12-24 NOTE — Telephone Encounter (Signed)
Letter written and up front for her.

## 2019-01-05 ENCOUNTER — Other Ambulatory Visit: Payer: Self-pay | Admitting: Family Medicine

## 2019-01-05 NOTE — Telephone Encounter (Signed)
Requested Prescriptions  Pending Prescriptions Disp Refills  . gabapentin (NEURONTIN) 300 MG capsule [Pharmacy Med Name: GABAPENTIN 300 MG CAPSULE] 540 capsule 0    Sig: TAKE 3 CAPSULES (900 MG TOTAL) BY MOUTH 2 (TWO) TIMES DAILY.     Neurology: Anticonvulsants - gabapentin Passed - 01/05/2019  9:32 AM      Passed - Valid encounter within last 12 months    Recent Outpatient Visits          2 months ago Hypercholesterolemia   [place], [place], DO   6 months ago Hypercholesterolemia   [place], Megan P, DO   9 months ago Welcome to [employer] preventive visit   [place], [place], DO   1 year ago Recurrent major depressive disorder, in full remission [facility])   [place], [place] P, DO   1 year ago Primary insomnia   [place], [place], DO      Future Appointments            In 3 months [place], Barb Merino, DO [facility], [facility]

## 2019-02-26 ENCOUNTER — Other Ambulatory Visit: Payer: Self-pay | Admitting: Family Medicine

## 2019-03-11 ENCOUNTER — Other Ambulatory Visit: Payer: Self-pay | Admitting: Family Medicine

## 2019-04-11 ENCOUNTER — Encounter: Payer: Medicare HMO | Admitting: Family Medicine

## 2019-05-01 ENCOUNTER — Ambulatory Visit (INDEPENDENT_AMBULATORY_CARE_PROVIDER_SITE_OTHER): Payer: Medicare HMO

## 2019-05-01 ENCOUNTER — Other Ambulatory Visit: Payer: Self-pay

## 2019-05-01 VITALS — BP 106/62 | HR 84 | Temp 98.2°F | Resp 16

## 2019-05-01 DIAGNOSIS — Z0000 Encounter for general adult medical examination without abnormal findings: Secondary | ICD-10-CM

## 2019-05-01 NOTE — Progress Notes (Signed)
Subjective:   Kathryn Conway is a 68 y.o. female who presents for an Initial Medicare Annual Wellness Visit.  Review of Systems     Cardiac Risk Factors include: advanced age (>29men, >78 women)     Objective:    Today's Vitals   05/01/19 1508 05/01/19 1510  BP: 106/62   Pulse: 84   Resp: 16   Temp: 98.2 F (36.8 C)   TempSrc: Temporal   PainSc:  3    There is no height or weight on file to calculate BMI.  Advanced Directives 04/26/2018  Does Patient Have a Medical Advance Directive? No  Would patient like information on creating a medical advance directive? No - Patient declined    Current Medications (verified) Outpatient Encounter Medications as of 05/01/2019  Medication Sig  . aspirin EC 81 MG tablet Take 81 mg by mouth daily.  Marland Kitchen atorvastatin (LIPITOR) 40 MG tablet Take 1 tablet (40 mg total) by mouth daily.  . citalopram (CELEXA) 40 MG tablet TAKE 1 TABLET BY MOUTH EVERY DAY  . estradiol (ESTRACE) 0.1 MG/GM vaginal cream Place 1 Applicatorful vaginally at bedtime. Insert 1g nightly for 1 wk, then 1 g once weekly as maintenace  . gabapentin (NEURONTIN) 300 MG capsule TAKE 3 CAPSULES (900 MG TOTAL) BY MOUTH 2 (TWO) TIMES DAILY.  Marland Kitchen loratadine (CLARITIN) 10 MG tablet Take 10 mg by mouth daily.  Marland Kitchen MEGARED OMEGA-3 KRILL OIL PO Take by mouth daily.  Marland Kitchen zolpidem (AMBIEN) 10 MG tablet Take 0.5 tablets (5 mg total) by mouth at bedtime as needed.   No facility-administered encounter medications on file as of 05/01/2019.     Allergies (verified) Sulfa antibiotics   History: Past Medical History:  Diagnosis Date  . Depression   . Fibromyalgia   . Hyperlipidemia   . Insomnia   . PONV (postoperative nausea and vomiting)   . TMJ (temporomandibular joint disorder)   . Uterine fibroid   . Wears contact lenses   . Wears dentures    full upper, partial lower   Past Surgical History:  Procedure Laterality Date  . ABDOMINAL HYSTERECTOMY    . COLONOSCOPY  05/20/2012    repeat 5 years  . COLONOSCOPY WITH PROPOFOL N/A 04/26/2018   Procedure: COLONOSCOPY WITH PROPOFOL;  Surgeon: Lucilla Lame, MD;  Location: [facility];  Service: Endoscopy;  Laterality: N/A;  . laparoscopic gyn procedure     done before her hysterectomy  . OOPHORECTOMY Left    Family History  Problem Relation Age of Onset  . Heart disease Mother   . Hypertension Mother   . Depression Mother   . Osteoporosis Mother   . Colon polyps Mother   . Cirrhosis Mother        non alcoholic  . Liver disease Mother        fatty liver disease  . Heart disease Father   . Hypertension Father   . Colon polyps Father   . Stroke Father   . Heart disease Sister   . Hypertension Sister   . Colon polyps Sister   . Diabetes Sister   . Hypertension Daughter   . Colon polyps Daughter   . Heart attack Brother   . Hypertension Brother   . Heart attack Brother   . Hyperlipidemia Son    Social History   Socioeconomic History  . Marital status: Married    Spouse name: Not on file  . Number of children: Not on file  . Years of education:  Not on file  . Highest education level: Not on file  Occupational History  . Occupation: part time   Social Needs  . Financial resource strain: Not hard at all  . Food insecurity    Worry: Never true    Inability: Never true  . Transportation needs    Medical: No    Non-medical: No  Tobacco Use  . Smoking status: Never Smoker  . Smokeless tobacco: Never Used  Substance and Sexual Activity  . Alcohol use: Yes    Alcohol/week: 3.0 standard drinks    Types: 3 Glasses of wine per week  . Drug use: No  . Sexual activity: Yes    Birth control/protection: Surgical    Comment: Hysterectomy  Lifestyle  . Physical activity    Days per week: 0 days    Minutes per session: 0 min  . Stress: Not at all  Relationships  . Social connections    Talks on phone: More than three times a week    Gets together: More than three times a week    Attends  religious service: More than 4 times per year    Active member of club or organization: No    Attends meetings of clubs or organizations: Never    Relationship status: Married  Other Topics Concern  . Not on file  Social History Narrative  . Not on file    Tobacco Counseling Counseling given: Not Answered   Clinical Intake:  Pre-visit preparation completed: Yes  Pain : 0-10 Pain Score: 3  Pain Type: Chronic pain Pain Location: Generalized Pain Descriptors / Indicators: Aching Pain Onset: More than a month ago Pain Frequency: Constant     Nutritional Risks: None Diabetes: No  How often do you need to have someone help you when you read instructions, pamphlets, or other written materials from your doctor or pharmacy?: 1 - Never  Interpreter Needed?: No  Information entered by :: Tiffany HIll,LPN   Activities of Daily Living In your present state of health, do you have any difficulty performing the following activities: 05/01/2019  Hearing? N  Comment some voices, no hearing aids  Vision? N  Comment contacts, goes to [facility]  Difficulty concentrating or making decisions? Y  Comment comes back  Walking or climbing stairs? N  Dressing or bathing? N  Doing errands, shopping? N  Preparing Food and eating ? N  Using the Toilet? N  In the past six months, have you accidently leaked urine? N  Comment panty liner for prevention  Do you have problems with loss of bowel control? N  Managing your Medications? N  Managing your Finances? N  Housekeeping or managing your Housekeeping? N  Some recent data might be hidden     Immunizations and Health Maintenance Immunization History  Administered Date(s) Administered  . Influenza-Unspecified 04/26/2016  . Zoster 05/11/2014  . Zoster Recombinat (Shingrix) 04/19/2018, 08/24/2018   Health Maintenance Due  Topic Date Due  . MAMMOGRAM  07/22/2016  . DEXA SCAN  02/17/2018    Patient Care Team: Valerie Roys, DO as PCP - General (Family Medicine)  Indicate any recent Medical Services you may have received from other than [facility] providers in the past year (date may be approximate).     Assessment:   This is a routine wellness examination for [place].  Hearing/Vision screen  Hearing Screening   125Hz  250Hz  500Hz  1000Hz  2000Hz  3000Hz  4000Hz  6000Hz  8000Hz   Right ear:  Left ear:           Vision Screening Comments: Goes to [facility] annually   Dietary issues and exercise activities discussed: Current Exercise Habits: The patient has a physically strenuous job, but has no regular exercise apart from work., Exercise limited by: None identified  Goals   None    Depression Screen PHQ 2/9 Scores 05/01/2019 10/08/2018 04/02/2018 12/25/2017 06/22/2017 06/22/2017 12/05/2016  PHQ - 2 Score 0 0 0 0 2 0 0  PHQ- 9 Score - 0 5 4 5  - -    Fall Risk Fall Risk  05/01/2019 04/02/2018 04/02/2018 12/25/2017 12/25/2017  Falls in the past year? 0 No No No No  Number falls in past yr: 0 - - - -  Injury with Fall? 0 - - - -   FALL RISK PREVENTION PERTAINING TO THE HOME:  Any stairs in or around the home? No  If so, are there any without handrails? No   Home free of loose throw rugs in walkways, pet beds, electrical cords, etc? Yes  Adequate lighting in your home to reduce risk of falls? Yes   ASSISTIVE DEVICES UTILIZED TO PREVENT FALLS:  Life alert? No  Use of a cane, walker or w/c? No  Grab bars in the bathroom? No  Shower chair or bench in shower? No  Elevated toilet seat or a handicapped toilet? No    DME ORDERS:  DME order needed?  No   TIMED UP AND GO:  Was the test performed? Yes .  Length of time to ambulate 10 feet: 8 sec.   GAIT:  Appearance of gait: Gait steady and fast without the use of an assistive device.  Education: Fall risk prevention has been discussed.  Intervention(s) required? No   DME/home health order needed?  No     Cognitive Function:      6CIT Screen 05/01/2019 04/02/2018  What Year? 0 points 0 points  What month? 0 points 0 points  What time? 0 points 0 points  Count back from 20 0 points 0 points  Months in reverse 0 points 0 points  Repeat phrase 0 points 0 points  Total Score 0 0    Screening Tests Health Maintenance  Topic Date Due  . MAMMOGRAM  07/22/2016  . DEXA SCAN  02/17/2018  . INFLUENZA VACCINE  10/01/2019 (Originally 02/01/2019)  . TETANUS/TDAP  04/30/2020 (Originally 02/18/1970)  . PNA vac Low Risk Adult (1 of 2 - PCV13) 04/30/2020 (Originally 02/17/2018)  . COLONOSCOPY  04/26/2028  . Hepatitis C Screening  Completed    Qualifies for Shingles Vaccine? Shingrix completed   Tdap: Discussed need for TD/TDAP vaccine, patient verbalized understanding that this is not covered as a preventative with there insurance and to call the office if she develops any new skin injuries, ie: cuts, scrapes, bug bites, or open wounds.  Flu Vaccine: Due for Flu vaccine. Does the patient want to receive this vaccine today?  No . Education has been provided regarding the importance of this vaccine but still declined. Advised may receive this vaccine at local pharmacy or Health Dept. Aware to provide a copy of the vaccination record if obtained from local pharmacy or Health Dept. Verbalized acceptance and understanding.  Pneumococcal Vaccine: Due for Pneumococcal vaccine. Does the patient want to receive this vaccine today?  No . Education has been provided regarding the importance of this vaccine but still declined. Advised may receive this vaccine at local pharmacy or Health Dept. Aware to  provide a copy of the vaccination record if obtained from local pharmacy or Health Dept. Verbalized acceptance and understanding.   Cancer Screenings:  Colorectal Screening: Completed 04/26/2018. Repeat every 10 years  Mammogram: ordered  Bone Density: ordered  Lung Cancer Screening: (Low Dose CT Chest recommended if Age 58-80 years, 30  pack-year currently smoking OR have quit w/in 15years.) does not qualify.    Additional Screening:  Hepatitis C Screening: does qualify; Completed 04/02/2018  Vision Screening: Recommended annual ophthalmology exams for early detection of glaucoma and other disorders of the eye. Is the patient up to date with their annual eye exam?  Yes  Who is the provider or what is the name of the office in which the pt attends annual eye exams? [facility]   Dental Screening: Recommended annual dental exams for proper oral hygiene  Community Resource Referral:  CRR required this visit?  No       Plan:  I have personally reviewed and addressed the Medicare Annual Wellness questionnaire and have noted the following in the patient's chart:  A. Medical and social history B. Use of alcohol, tobacco or illicit drugs  C. Current medications and supplements D. Functional ability and status E.  Nutritional status F.  Physical activity G. Advance directives H. List of other physicians I.  Hospitalizations, surgeries, and ER visits in previous 12 months J.  Oriental such as hearing and vision if needed, cognitive and depression L. Referrals and appointments   In addition, I have reviewed and discussed with patient certain preventive protocols, quality metrics, and best practice recommendations. A written personalized care plan for preventive services as well as general preventive health recommendations were provided to patient.   Signed,    Bevelyn Ngo, LPN   [id number]  Nurse Health Advisor   Nurse Notes: none

## 2019-05-01 NOTE — Patient Instructions (Signed)
Kathryn Conway , Thank you for taking time to come for your Medicare Wellness Visit. I appreciate your ongoing commitment to your health goals. Please review the following plan we discussed and let me know if I can assist you in the future.   Screening recommendations/referrals: Colonoscopy: completed 04/2018 Mammogram: Please call [phone] to schedule your mammogram.  Bone Density: Please call [phone] to schedule  Recommended yearly ophthalmology/optometry visit for glaucoma screening and checkup Recommended yearly dental visit for hygiene and checkup  Vaccinations: Influenza vaccine: declined  Pneumococcal vaccine: declined Tdap vaccine: please call if needed  Shingles vaccine: shingrix completed     Advanced directives: Advance directive discussed with you today.  Once this is complete please bring a copy in to our office so we can scan it into your chart.  Conditions/risks identified: none   Next appointment: Follow up in one year for your annual wellness visit    Preventive Care 65 Years and Older, Female Preventive care refers to lifestyle choices and visits with your health care provider that can promote health and wellness. What does preventive care include?  A yearly physical exam. This is also called an annual well check.  Dental exams once or twice a year.  Routine eye exams. Ask your health care provider how often you should have your eyes checked.  Personal lifestyle choices, including:  Daily care of your teeth and gums.  Regular physical activity.  Eating a healthy diet.  Avoiding tobacco and drug use.  Limiting alcohol use.  Practicing safe sex.  Taking low-dose aspirin every day.  Taking vitamin and mineral supplements as recommended by your health care provider. What happens during an annual well check? The services and screenings done by your health care provider during your annual well check will depend on your age, overall health,  lifestyle risk factors, and family history of disease. Counseling  Your health care provider may ask you questions about your:  Alcohol use.  Tobacco use.  Drug use.  Emotional well-being.  Home and relationship well-being.  Sexual activity.  Eating habits.  History of falls.  Memory and ability to understand (cognition).  Work and work Statistician.  Reproductive health. Screening  You may have the following tests or measurements:  Height, weight, and BMI.  Blood pressure.  Lipid and cholesterol levels. These may be checked every 5 years, or more frequently if you are over 82 years old.  Skin check.  Lung cancer screening. You may have this screening every year starting at age 2 if you have a 30-pack-year history of smoking and currently smoke or have quit within the past 15 years.  Fecal occult blood test (FOBT) of the stool. You may have this test every year starting at age 46.  Flexible sigmoidoscopy or colonoscopy. You may have a sigmoidoscopy every 5 years or a colonoscopy every 10 years starting at age 61.  Hepatitis C blood test.  Hepatitis B blood test.  Sexually transmitted disease (STD) testing.  Diabetes screening. This is done by checking your blood sugar (glucose) after you have not eaten for a while (fasting). You may have this done every 1-3 years.  Bone density scan. This is done to screen for osteoporosis. You may have this done starting at age 104.  Mammogram. This may be done every 1-2 years. Talk to your health care provider about how often you should have regular mammograms. Talk with your health care provider about your test results, treatment options, and if necessary, the need for  more tests. Vaccines  Your health care provider may recommend certain vaccines, such as:  Influenza vaccine. This is recommended every year.  Tetanus, diphtheria, and acellular pertussis (Tdap, Td) vaccine. You may need a Td booster every 10 years.  Zoster  vaccine. You may need this after age 62.  Pneumococcal 13-valent conjugate (PCV13) vaccine. One dose is recommended after age 79.  Pneumococcal polysaccharide (PPSV23) vaccine. One dose is recommended after age 70. Talk to your health care provider about which screenings and vaccines you need and how often you need them. This information is not intended to replace advice given to you by your health care provider. Make sure you discuss any questions you have with your health care provider. Document Released: 07/16/2015 Document Revised: 03/08/2016 Document Reviewed: 04/20/2015 Elsevier Interactive Patient Education  2017 [employer] Prevention in the Home Falls can cause injuries. They can happen to people of all ages. There are many things you can do to make your home safe and to help prevent falls. What can I do on the outside of my home?  Regularly fix the edges of walkways and driveways and fix any cracks.  Remove anything that might make you trip as you walk through a door, such as a raised step or threshold.  Trim any bushes or trees on the path to your home.  Use bright outdoor lighting.  Clear any walking paths of anything that might make someone trip, such as rocks or tools.  Regularly check to see if handrails are loose or broken. Make sure that both sides of any steps have handrails.  Any raised decks and porches should have guardrails on the edges.  Have any leaves, snow, or ice cleared regularly.  Use sand or salt on walking paths during winter.  Clean up any spills in your garage right away. This includes oil or grease spills. What can I do in the bathroom?  Use night lights.  Install grab bars by the toilet and in the tub and shower. Do not use towel bars as grab bars.  Use non-skid mats or decals in the tub or shower.  If you need to sit down in the shower, use a plastic, non-slip stool.  Keep the floor dry. Clean up any water that spills on the  floor as soon as it happens.  Remove soap buildup in the tub or shower regularly.  Attach bath mats securely with double-sided non-slip rug tape.  Do not have throw rugs and other things on the floor that can make you trip. What can I do in the bedroom?  Use night lights.  Make sure that you have a light by your bed that is easy to reach.  Do not use any sheets or blankets that are too big for your bed. They should not hang down onto the floor.  Have a firm chair that has side arms. You can use this for support while you get dressed.  Do not have throw rugs and other things on the floor that can make you trip. What can I do in the kitchen?  Clean up any spills right away.  Avoid walking on wet floors.  Keep items that you use a lot in easy-to-reach places.  If you need to reach something above you, use a strong step stool that has a grab bar.  Keep electrical cords out of the way.  Do not use floor polish or wax that makes floors slippery. If you must use wax, use non-skid  floor wax.  Do not have throw rugs and other things on the floor that can make you trip. What can I do with my stairs?  Do not leave any items on the stairs.  Make sure that there are handrails on both sides of the stairs and use them. Fix handrails that are broken or loose. Make sure that handrails are as long as the stairways.  Check any carpeting to make sure that it is firmly attached to the stairs. Fix any carpet that is loose or worn.  Avoid having throw rugs at the top or bottom of the stairs. If you do have throw rugs, attach them to the floor with carpet tape.  Make sure that you have a light switch at the top of the stairs and the bottom of the stairs. If you do not have them, ask someone to add them for you. What else can I do to help prevent falls?  Wear shoes that:  Do not have high heels.  Have rubber bottoms.  Are comfortable and fit you well.  Are closed at the toe. Do not wear  sandals.  If you use a stepladder:  Make sure that it is fully opened. Do not climb a closed stepladder.  Make sure that both sides of the stepladder are locked into place.  Ask someone to hold it for you, if possible.  Clearly mark and make sure that you can see:  Any grab bars or handrails.  First and last steps.  Where the edge of each step is.  Use tools that help you move around (mobility aids) if they are needed. These include:  Canes.  Walkers.  Scooters.  Crutches.  Turn on the lights when you go into a dark area. Replace any light bulbs as soon as they burn out.  Set up your furniture so you have a clear path. Avoid moving your furniture around.  If any of your floors are uneven, fix them.  If there are any pets around you, be aware of where they are.  Review your medicines with your doctor. Some medicines can make you feel dizzy. This can increase your chance of falling. Ask your doctor what other things that you can do to help prevent falls. This information is not intended to replace advice given to you by your health care provider. Make sure you discuss any questions you have with your health care provider. Document Released: 04/15/2009 Document Revised: 11/25/2015 Document Reviewed: 07/24/2014 Elsevier Interactive Patient Education  2017 [employer].

## 2019-05-06 ENCOUNTER — Encounter: Payer: Medicare HMO | Admitting: Family Medicine

## 2019-05-09 ENCOUNTER — Other Ambulatory Visit: Payer: Self-pay

## 2019-05-09 ENCOUNTER — Ambulatory Visit (INDEPENDENT_AMBULATORY_CARE_PROVIDER_SITE_OTHER): Payer: Medicare HMO | Admitting: Nurse Practitioner

## 2019-05-09 ENCOUNTER — Encounter: Payer: Self-pay | Admitting: Nurse Practitioner

## 2019-05-09 VITALS — BP 122/72 | HR 76 | Temp 98.4°F | Ht 63.0 in | Wt 133.0 lb

## 2019-05-09 DIAGNOSIS — M797 Fibromyalgia: Secondary | ICD-10-CM

## 2019-05-09 DIAGNOSIS — E7800 Pure hypercholesterolemia, unspecified: Secondary | ICD-10-CM | POA: Diagnosis not present

## 2019-05-09 DIAGNOSIS — Z0000 Encounter for general adult medical examination without abnormal findings: Secondary | ICD-10-CM

## 2019-05-09 DIAGNOSIS — F5101 Primary insomnia: Secondary | ICD-10-CM

## 2019-05-09 DIAGNOSIS — R69 Illness, unspecified: Secondary | ICD-10-CM | POA: Diagnosis not present

## 2019-05-09 DIAGNOSIS — F3342 Major depressive disorder, recurrent, in full remission: Secondary | ICD-10-CM | POA: Diagnosis not present

## 2019-05-09 NOTE — Assessment & Plan Note (Signed)
Chronic, stable with occasional use Ambien.  Recommend not to use Benadryl.  Discussed proper sleep hygiene.  Continue current regimen and adjust as needed.  Could consider change to Trazodone in future with aging and reduction Ambien.

## 2019-05-09 NOTE — Patient Instructions (Signed)

## 2019-05-09 NOTE — Assessment & Plan Note (Signed)
Chronic, stable.  Denies SI/HI.  Continue current regimen and adjust as needed. 

## 2019-05-09 NOTE — Assessment & Plan Note (Signed)
Chronic, stable on current regimen.  Continue current regimen and adjust as needed.

## 2019-05-09 NOTE — Assessment & Plan Note (Signed)
Chronic, stable.  Continue current medication regimen and adjust as needed.  Lipid panel and CMP today. 

## 2019-05-09 NOTE — Progress Notes (Signed)
BP 122/72   Pulse 76   Temp 98.4 F (36.9 C) (Oral)   Ht 5\' 3"  (1.6 m)   Wt 133 lb (60.3 kg)   SpO2 98%   BMI 23.56 kg/m    Subjective:    Patient ID: Kathryn Conway, female    DOB: [birth year], 68 y.o.   MRN: [id number]  HPI: Kathryn Conway is a 68 y.o. female presenting on 05/09/2019 for comprehensive medical examination. Current medical complaints include:none  She currently lives with: husband Menopausal Symptoms: no   HYPERLIPIDEMIA Continues on Lipitor 40 MG daily and ASA. Hyperlipidemia status: good compliance Satisfied with current treatment?  yes Side effects:  no Medication compliance: good compliance Past cholesterol meds: Lipitor Supplements: fish oil Aspirin:  yes The 10-year ASCVD risk score [name] DC Jr., et al., 2013) is: 4.7%   Values used to calculate the score:     Age: 76 years     Sex: Female     Is Non-Hispanic African American: No     Diabetic: No     Tobacco smoker: No     Systolic Blood Pressure: 123XX123 mmHg     Is BP treated: No     HDL Cholesterol: 68 mg/dL     Total Cholesterol: 158 mg/dL Chest pain:  no Coronary artery disease:  no Family history CAD:  no Family history early CAD:  no  DEPRESSION Continues on Celexa for mood and Ambien to help sleep, which she takes 2-3 times a week as needed, depending on her sleep.  Reports she does normally "try to take two Benadryl", discussed BEERS criteria with her.   Mood status: stable Satisfied with current treatment?: yes Symptom severity: mild  Duration of current treatment : chronic Side effects: no Medication compliance: good compliance Psychotherapy/counseling: none Previous psychiatric medications: Celexa Depressed mood: no Anxious mood: no Anhedonia: no Significant weight loss or gain: no Insomnia: yes hard to fall asleep Fatigue: no Feelings of worthlessness or guilt: no Impaired concentration/indecisiveness: no Suicidal ideations: no Hopelessness: no Crying spells: no  Depression screen [facility] 2/9 05/09/2019 05/01/2019 10/08/2018 04/02/2018 12/25/2017  Decreased Interest 0 0 0 0 0  Down, Depressed, Hopeless 0 0 0 0 0  PHQ - 2 Score 0 0 0 0 0  Altered sleeping 1 - 0 3 2  Tired, decreased energy 1 - 0 2 2  Change in appetite 0 - 0 0 0  Feeling bad or failure about yourself  0 - 0 0 0  Trouble concentrating 0 - 0 0 0  Moving slowly or fidgety/restless 0 - 0 0 0  Suicidal thoughts 0 - 0 0 0  PHQ-9 Score 2 - 0 5 4  Difficult doing work/chores Not difficult at all - Not difficult at all Not difficult at all Not difficult at all    The patient does not have a history of falls. I did not complete a risk assessment for falls. A plan of care for falls was not documented.   Past Medical History:  Past Medical History:  Diagnosis Date  . Depression   . Fibromyalgia   . Hyperlipidemia   . Insomnia   . PONV (postoperative nausea and vomiting)   . TMJ (temporomandibular joint disorder)   . Uterine fibroid   . Wears contact lenses   . Wears dentures    full upper, partial lower    Surgical History:  Past Surgical History:  Procedure Laterality Date  . ABDOMINAL HYSTERECTOMY    . COLONOSCOPY  05/20/2012   repeat 5 years  . COLONOSCOPY WITH PROPOFOL N/A 04/26/2018   Procedure: COLONOSCOPY WITH PROPOFOL;  Surgeon: Lucilla Lame, MD;  Location: [facility];  Service: Endoscopy;  Laterality: N/A;  . laparoscopic gyn procedure     done before her hysterectomy  . OOPHORECTOMY Left     Medications:  Current Outpatient Medications on File Prior to Visit  Medication Sig  . aspirin EC 81 MG tablet Take 81 mg by mouth daily.  Marland Kitchen atorvastatin (LIPITOR) 40 MG tablet Take 1 tablet (40 mg total) by mouth daily.  . citalopram (CELEXA) 40 MG tablet TAKE 1 TABLET BY MOUTH EVERY DAY  . estradiol (ESTRACE) 0.1 MG/GM vaginal cream Place 1 Applicatorful vaginally at bedtime. Insert 1g nightly for 1 wk, then 1 g once weekly as maintenace  . gabapentin (NEURONTIN) 300  MG capsule TAKE 3 CAPSULES (900 MG TOTAL) BY MOUTH 2 (TWO) TIMES DAILY.  Marland Kitchen loratadine (CLARITIN) 10 MG tablet Take 10 mg by mouth daily.  Marland Kitchen MEGARED OMEGA-3 KRILL OIL PO Take by mouth daily.  Marland Kitchen zolpidem (AMBIEN) 10 MG tablet Take 0.5 tablets (5 mg total) by mouth at bedtime as needed.   No current facility-administered medications on file prior to visit.     Allergies:  Allergies  Allergen Reactions  . Sulfa Antibiotics Rash    Social History:  Social History   Socioeconomic History  . Marital status: Married    Spouse name: Not on file  . Number of children: Not on file  . Years of education: Not on file  . Highest education level: Not on file  Occupational History  . Occupation: part time   Social Needs  . Financial resource strain: Not hard at all  . Food insecurity    Worry: Never true    Inability: Never true  . Transportation needs    Medical: No    Non-medical: No  Tobacco Use  . Smoking status: Never Smoker  . Smokeless tobacco: Never Used  Substance and Sexual Activity  . Alcohol use: Yes    Alcohol/week: 3.0 standard drinks    Types: 3 Glasses of wine per week  . Drug use: No  . Sexual activity: Yes    Birth control/protection: Surgical    Comment: Hysterectomy  Lifestyle  . Physical activity    Days per week: 0 days    Minutes per session: 0 min  . Stress: Not at all  Relationships  . Social connections    Talks on phone: More than three times a week    Gets together: More than three times a week    Attends religious service: More than 4 times per year    Active member of club or organization: No    Attends meetings of clubs or organizations: Never    Relationship status: Married  . Intimate partner violence    Fear of current or ex partner: No    Emotionally abused: No    Physically abused: No    Forced sexual activity: No  Other Topics Concern  . Not on file  Social History Narrative  . Not on file   Social History   Tobacco Use   Smoking Status Never Smoker  Smokeless Tobacco Never Used   Social History   Substance and Sexual Activity  Alcohol Use Yes  . Alcohol/week: 3.0 standard drinks  . Types: 3 Glasses of wine per week    Family History:  Family History  Problem Relation Age of Onset  .  Heart disease Mother   . Hypertension Mother   . Depression Mother   . Osteoporosis Mother   . Colon polyps Mother   . Cirrhosis Mother        non alcoholic  . Liver disease Mother        fatty liver disease  . Heart disease Father   . Hypertension Father   . Colon polyps Father   . Stroke Father   . Heart disease Sister   . Hypertension Sister   . Colon polyps Sister   . Diabetes Sister   . Hypertension Daughter   . Colon polyps Daughter   . Heart attack Brother   . Hypertension Brother   . Heart attack Brother   . Hyperlipidemia Son     Past medical history, surgical history, medications, allergies, family history and social history reviewed with patient today and changes made to appropriate areas of the chart.   Review of Systems - negative All other ROS negative except what is listed above and in the HPI.      Objective:    BP 122/72   Pulse 76   Temp 98.4 F (36.9 C) (Oral)   Ht 5\' 3"  (1.6 m)   Wt 133 lb (60.3 kg)   SpO2 98%   BMI 23.56 kg/m   Wt Readings from Last 3 Encounters:  05/09/19 133 lb (60.3 kg)  10/08/18 134 lb (60.8 kg)  06/21/18 130 lb (59 kg)    Physical Exam Constitutional:      General: She is awake. She is not in acute distress.    Appearance: She is well-developed. She is not ill-appearing.  HENT:     Head: Normocephalic and atraumatic.     Right Ear: Hearing, tympanic membrane, ear canal and external ear normal. No drainage.     Left Ear: Hearing, tympanic membrane, ear canal and external ear normal. No drainage.     Nose: Nose normal.     Right Sinus: No maxillary sinus tenderness or frontal sinus tenderness.     Left Sinus: No maxillary sinus tenderness or  frontal sinus tenderness.     Mouth/Throat:     Mouth: Mucous membranes are moist.     Pharynx: Oropharynx is clear. Uvula midline. No pharyngeal swelling, oropharyngeal exudate or posterior oropharyngeal erythema.  Eyes:     General: Lids are normal.        Right eye: No discharge.        Left eye: No discharge.     Extraocular Movements: Extraocular movements intact.     Conjunctiva/sclera: Conjunctivae normal.     Pupils: Pupils are equal, round, and reactive to light.     Visual Fields: Right eye visual fields normal and left eye visual fields normal.  Neck:     Musculoskeletal: Normal range of motion and neck supple.     Thyroid: No thyromegaly.     Vascular: No carotid bruit.     Trachea: Trachea normal.  Cardiovascular:     Rate and Rhythm: Normal rate and regular rhythm.     Heart sounds: Normal heart sounds. No murmur. No gallop.   Pulmonary:     Effort: Pulmonary effort is normal. No accessory muscle usage or respiratory distress.     Breath sounds: Normal breath sounds.  Chest:     Breasts:        Right: Normal.        Left: Normal.  Abdominal:     General: Bowel sounds are normal.  Palpations: Abdomen is soft. There is no hepatomegaly or splenomegaly.     Tenderness: There is no abdominal tenderness.  Musculoskeletal: Normal range of motion.     Right lower leg: No edema.     Left lower leg: No edema.  Lymphadenopathy:     Head:     Right side of head: No submental, submandibular, tonsillar, preauricular or posterior auricular adenopathy.     Left side of head: No submental, submandibular, tonsillar, preauricular or posterior auricular adenopathy.     Cervical: No cervical adenopathy.     Upper Body:     Right upper body: No supraclavicular, axillary or pectoral adenopathy.     Left upper body: No supraclavicular, axillary or pectoral adenopathy.  Skin:    General: Skin is warm and dry.     Capillary Refill: Capillary refill takes less than 2 seconds.      Findings: No rash.  Neurological:     Mental Status: She is alert and oriented to person, place, and time.     Cranial Nerves: Cranial nerves are intact.     Gait: Gait is intact.     Deep Tendon Reflexes: Reflexes are normal and symmetric.     Reflex Scores:      Brachioradialis reflexes are 2+ on the right side and 2+ on the left side.      Patellar reflexes are 2+ on the right side and 2+ on the left side. Psychiatric:        Attention and Perception: Attention normal.        Mood and Affect: Mood normal.        Speech: Speech normal.        Behavior: Behavior normal. Behavior is cooperative.        Thought Content: Thought content normal.        Judgment: Judgment normal.     Results for orders placed or performed in visit on 06/21/18  Comprehensive metabolic panel  Result Value Ref Range   Glucose 107 (H) 65 - 99 mg/dL   BUN 13 8 - 27 mg/dL   Creatinine, Ser 0.69 0.57 - 1.00 mg/dL   GFR calc non Af Amer 92 >59 mL/min/1.73   GFR calc Af Amer 106 >59 mL/min/1.73   BUN/Creatinine Ratio 19 12 - 28   Sodium 141 134 - 144 mmol/L   Potassium 4.1 3.5 - 5.2 mmol/L   Chloride 103 96 - 106 mmol/L   CO2 22 20 - 29 mmol/L   Calcium 9.0 8.7 - 10.3 mg/dL   Total Protein 6.5 6.0 - 8.5 g/dL   Albumin 4.5 3.6 - 4.8 g/dL   Globulin, Total 2.0 1.5 - 4.5 g/dL   Albumin/Globulin Ratio 2.3 (H) 1.2 - 2.2   Bilirubin Total 0.2 0.0 - 1.2 mg/dL   Alkaline Phosphatase 74 39 - 117 IU/L   AST 20 0 - 40 IU/L   ALT 17 0 - 32 IU/L  Lipid Panel w/o Chol/HDL Ratio  Result Value Ref Range   Cholesterol, Total 158 100 - 199 mg/dL   Triglycerides 77 0 - 149 mg/dL   HDL 68 >39 mg/dL   VLDL Cholesterol Cal 15 5 - 40 mg/dL   LDL Calculated 75 0 - 99 mg/dL      Assessment & Plan:   Problem List Items Addressed This Visit      Other   Fibromyalgia    Chronic, stable on current regimen.  Continue current regimen and adjust as needed.  Hypercholesterolemia    Chronic, stable.  Continue  current medication regimen and adjust as needed.  Lipid panel and CMP today.        Relevant Orders   Comprehensive metabolic panel   Lipid Panel w/o Chol/HDL Ratio out   Insomnia    Chronic, stable with occasional use Ambien.  Recommend not to use Benadryl.  Discussed proper sleep hygiene.  Continue current regimen and adjust as needed.  Could consider change to Trazodone in future with aging and reduction Ambien.      Recurrent major depressive disorder, in full remission (HCC)    Chronic, stable.  Denies SI/HI.  Continue current regimen and adjust as needed.         Other Visit Diagnoses    Annual physical exam    -  Primary   Obtain annual labs to include: CBC, CMP, TSH, and lipid panel   Relevant Orders   CBC with Differential/Platelet out   TSH       Follow up plan: Return in about 6 months (around 11/06/2019) for HLD.   LABORATORY TESTING:  - Pap smear: not applicable  IMMUNIZATIONS:   - Tdap: Tetanus vaccination status reviewed: last tetanus booster within 10 years. - Influenza: Refused - Pneumovax: Refused - Prevnar: Refused - HPV: Refused - Zostavax vaccine: Up to date  SCREENING: -Mammogram: is going to call to schedule  - Colonoscopy: Up to date  - Bone Density: is going to call to schedule  -Hearing Test: not applicable -Spirometry: Not applicable   PATIENT COUNSELING:   Advised to take 1 mg of folate supplement per day if capable of pregnancy.   Sexuality: Discussed sexually transmitted diseases, partner selection, use of condoms, avoidance of unintended pregnancy  and contraceptive alternatives.   Advised to avoid cigarette smoking.  I discussed with the patient that most people either abstain from alcohol or drink within safe limits (<=14/week and <=4 drinks/occasion for males, <=7/weeks and <= 3 drinks/occasion for females) and that the risk for alcohol disorders and other health effects rises proportionally with the number of drinks per week and  how often a drinker exceeds daily limits.  Discussed cessation/primary prevention of drug use and availability of treatment for abuse.   Diet: Encouraged to adjust caloric intake to maintain  or achieve ideal body weight, to reduce intake of dietary saturated fat and total fat, to limit sodium intake by avoiding high sodium foods and not adding table salt, and to maintain adequate dietary potassium and calcium preferably from fresh fruits, vegetables, and low-fat dairy products.    stressed the importance of regular exercise  Injury prevention: Discussed safety belts, safety helmets, smoke detector, smoking near bedding or upholstery.   Dental health: Discussed importance of regular tooth brushing, flossing, and dental visits.    NEXT PREVENTATIVE PHYSICAL DUE IN 1 YEAR. Return in about 6 months (around 11/06/2019) for HLD.

## 2019-05-10 LAB — CBC WITH DIFFERENTIAL/PLATELET
Basophils Absolute: 0 10*3/uL (ref 0.0–0.2)
Basos: 0 %
EOS (ABSOLUTE): 0.1 10*3/uL (ref 0.0–0.4)
Eos: 2 %
Hematocrit: 35.5 % (ref 34.0–46.6)
Hemoglobin: 11.7 g/dL (ref 11.1–15.9)
Immature Grans (Abs): 0 10*3/uL (ref 0.0–0.1)
Immature Granulocytes: 0 %
Lymphocytes Absolute: 2.6 10*3/uL (ref 0.7–3.1)
Lymphs: 44 %
MCH: 30.2 pg (ref 26.6–33.0)
MCHC: 33 g/dL (ref 31.5–35.7)
MCV: 92 fL (ref 79–97)
Monocytes Absolute: 0.4 10*3/uL (ref 0.1–0.9)
Monocytes: 8 %
Neutrophils Absolute: 2.7 10*3/uL (ref 1.4–7.0)
Neutrophils: 46 %
Platelets: 203 10*3/uL (ref 150–450)
RBC: 3.88 x10E6/uL (ref 3.77–5.28)
RDW: 13 % (ref 11.7–15.4)
WBC: 5.9 10*3/uL (ref 3.4–10.8)

## 2019-05-10 LAB — COMPREHENSIVE METABOLIC PANEL
ALT: 14 IU/L (ref 0–32)
AST: 22 IU/L (ref 0–40)
Albumin/Globulin Ratio: 2.1 (ref 1.2–2.2)
Albumin: 4.4 g/dL (ref 3.8–4.8)
Alkaline Phosphatase: 71 IU/L (ref 39–117)
BUN/Creatinine Ratio: 23 (ref 12–28)
BUN: 15 mg/dL (ref 8–27)
Bilirubin Total: 0.2 mg/dL (ref 0.0–1.2)
CO2: 24 mmol/L (ref 20–29)
Calcium: 9 mg/dL (ref 8.7–10.3)
Chloride: 101 mmol/L (ref 96–106)
Creatinine, Ser: 0.66 mg/dL (ref 0.57–1.00)
GFR calc Af Amer: 106 mL/min/{1.73_m2} (ref >59)
GFR calc non Af Amer: 92 mL/min/{1.73_m2} (ref >59)
Globulin, Total: 2.1 g/dL (ref 1.5–4.5)
Glucose: 80 mg/dL (ref 65–99)
Potassium: 4.1 mmol/L (ref 3.5–5.2)
Sodium: 140 mmol/L (ref 134–144)
Total Protein: 6.5 g/dL (ref 6.0–8.5)

## 2019-05-10 LAB — LIPID PANEL W/O CHOL/HDL RATIO
Cholesterol, Total: 160 mg/dL (ref 100–199)
HDL: 66 mg/dL (ref >39)
LDL Chol Calc (NIH): 81 mg/dL (ref 0–99)
Triglycerides: 68 mg/dL (ref 0–149)
VLDL Cholesterol Cal: 13 mg/dL (ref 5–40)

## 2019-05-10 LAB — TSH: TSH: 2.51 u[IU]/mL (ref 0.450–4.500)

## 2019-05-11 NOTE — Progress Notes (Signed)
Normal test results noted.  Please call patient and make them aware of normal results and will continue to monitor at regular visits.  Have a great day.  Look forward to seeing you at your next visit.

## 2019-05-15 ENCOUNTER — Telehealth: Payer: Self-pay

## 2019-05-15 DIAGNOSIS — Z780 Asymptomatic menopausal state: Secondary | ICD-10-CM

## 2019-05-15 DIAGNOSIS — Z1231 Encounter for screening mammogram for malignant neoplasm of breast: Secondary | ICD-10-CM

## 2019-05-15 NOTE — Telephone Encounter (Signed)
Copied from [place] [id number]. Topic: General - Inquiry >> May 13, 2019  4:12 PM Reyne Dumas L wrote: Reason for CRM:   Pt states that [facility] gave her a number to call to have her bone density and mammogram done.  Pt wants to get that number again because she lost it. Pt can be reached at [phone] >> May 14, 2019  9:35 AM Richardo Priest, NT wrote: Pt called in again stating we are needing to resend order as it has expired. Please advise.

## 2019-06-10 ENCOUNTER — Other Ambulatory Visit: Payer: Self-pay | Admitting: Family Medicine

## 2019-06-24 ENCOUNTER — Encounter: Payer: Self-pay | Admitting: Family Medicine

## 2019-06-24 ENCOUNTER — Ambulatory Visit
Admission: RE | Admit: 2019-06-24 | Discharge: 2019-06-24 | Disposition: A | Discharge status: Home / Self Care | Payer: Medicare HMO | Source: Ambulatory Visit | Attending: Family Medicine | Admitting: Family Medicine

## 2019-06-24 ENCOUNTER — Telehealth: Payer: Self-pay | Admitting: Family Medicine

## 2019-06-24 DIAGNOSIS — M810 Age-related osteoporosis without current pathological fracture: Secondary | ICD-10-CM | POA: Diagnosis not present

## 2019-06-24 DIAGNOSIS — M8588 Other specified disorders of bone density and structure, other site: Secondary | ICD-10-CM | POA: Diagnosis not present

## 2019-06-24 DIAGNOSIS — Z780 Asymptomatic menopausal state: Secondary | ICD-10-CM

## 2019-06-24 DIAGNOSIS — M816 Localized osteoporosis [Lequesne]: Secondary | ICD-10-CM

## 2019-06-24 DIAGNOSIS — Z1231 Encounter for screening mammogram for malignant neoplasm of breast: Secondary | ICD-10-CM

## 2019-06-24 NOTE — Telephone Encounter (Signed)
Please let her know that her bone density came back showing osteoporosis. I'd like to start her on some medicine to strengthen her bones if she's OK with that- let me know. Thanks.

## 2019-06-25 NOTE — Telephone Encounter (Signed)
Called pt, scheduled virtual appt for tomorrow she is able to get vitals

## 2019-06-25 NOTE — Telephone Encounter (Signed)
Called pt and let her know what Dr. Wynetta Emery said. Pt stated she would like to discuss more information on the medication with Dr. Wynetta Emery before making a decision.

## 2019-06-25 NOTE — Telephone Encounter (Signed)
[place]le Pt and left VM requesting call back.

## 2019-06-25 NOTE — Telephone Encounter (Signed)
Please schedule appointment.

## 2019-06-25 NOTE — Telephone Encounter (Signed)
Patient returning call to [facility], requesting a call back.

## 2019-06-26 ENCOUNTER — Other Ambulatory Visit: Payer: Self-pay

## 2019-06-26 ENCOUNTER — Ambulatory Visit (INDEPENDENT_AMBULATORY_CARE_PROVIDER_SITE_OTHER): Payer: Medicare HMO | Admitting: Family Medicine

## 2019-06-26 ENCOUNTER — Encounter: Payer: Self-pay | Admitting: Family Medicine

## 2019-06-26 VITALS — BP 119/71 | Wt 128.0 lb

## 2019-06-26 DIAGNOSIS — Z538 Procedure and treatment not carried out for other reasons: Secondary | ICD-10-CM

## 2019-06-26 MED ORDER — ALENDRONATE SODIUM 70 MG PO TABS: 70.0000 mg | ORAL_TABLET | ORAL | 11 refills | Status: DC

## 2019-06-30 NOTE — Progress Notes (Signed)
Patient not seen. Visit cancelled

## 2019-07-10 ENCOUNTER — Other Ambulatory Visit: Payer: Self-pay | Admitting: Family Medicine

## 2019-07-15 ENCOUNTER — Telehealth: Payer: Self-pay | Admitting: Family Medicine

## 2019-07-15 NOTE — Telephone Encounter (Signed)
Pt read the medication info foralendronate (FOSAMAX) 70 MG tablet    and was advised to let provider know if she was taking Asprin/ Pt states she is taking Asprin and ibuprofen/ please advise if she should make any changes

## 2019-07-15 NOTE — Telephone Encounter (Signed)
It's fine for her to take aspirin with the fosamax. Thanks!

## 2019-07-16 NOTE — Telephone Encounter (Signed)
Patient notified

## 2019-08-10 ENCOUNTER — Other Ambulatory Visit: Payer: Self-pay | Admitting: Family Medicine

## 2019-08-10 NOTE — Telephone Encounter (Signed)
Requested Prescriptions  Pending Prescriptions Disp Refills  . gabapentin (NEURONTIN) 300 MG capsule [Pharmacy Med Name: GABAPENTIN 300 MG CAPSULE] 180 capsule 1    Sig: TAKE 3 CAPSULES (900 MG TOTAL) BY MOUTH 2 (TWO) TIMES DAILY.     Neurology: Anticonvulsants - gabapentin Passed - 08/10/2019  6:42 PM      Passed - Valid encounter within last 12 months    Recent Outpatient Visits          1 month ago Appointment canceled by hospital   [place], [place], DO   3 months ago Annual physical exam   [facility] Venita Lick, NP   10 months ago Hypercholesterolemia   [place], [place], DO   1 year ago Hypercholesterolemia   [place], [place], DO   1 year ago [facility] [employer] preventive visit   [place], Barb Merino, DO      Future Appointments            In 3 months Wynetta Emery, Barb Merino, DO [place], [place]   In 9 months  [facility], [place]

## 2019-08-12 ENCOUNTER — Other Ambulatory Visit: Payer: Self-pay | Admitting: Family Medicine

## 2019-08-12 NOTE — Telephone Encounter (Signed)
Patient last seen 05/09/19 and has appointment 11/11/19

## 2019-09-07 ENCOUNTER — Other Ambulatory Visit: Payer: Self-pay | Admitting: Family Medicine

## 2019-09-19 ENCOUNTER — Other Ambulatory Visit: Payer: Self-pay | Admitting: Family Medicine

## 2019-09-19 NOTE — Telephone Encounter (Signed)
Requested Prescriptions  Pending Prescriptions Disp Refills  . gabapentin (NEURONTIN) 300 MG capsule [Pharmacy Med Name: GABAPENTIN 300 MG CAPSULE] 180 capsule 1    Sig: TAKE 3 CAPSULES (900 MG TOTAL) BY MOUTH 2 (TWO) TIMES DAILY.     Neurology: Anticonvulsants - gabapentin Passed - 09/19/2019  7:09 AM      Passed - Valid encounter within last 12 months    Recent Outpatient Visits          2 months ago Appointment canceled by hospital   [facility], [place], DO   4 months ago Annual physical exam   [facility] Venita Lick, NP   11 months ago Hypercholesterolemia   [place], [place], DO   1 year ago Hypercholesterolemia   [place], [place], DO   1 year ago [facility] [employer] preventive visit   [place], Barb Merino, DO      Future Appointments            In 1 month Wynetta Emery, Barb Merino, DO [place], [place]   In 7 months  [facility], [place]

## 2019-10-06 DIAGNOSIS — H524 Presbyopia: Secondary | ICD-10-CM | POA: Diagnosis not present

## 2019-10-14 DIAGNOSIS — Z0100 Encounter for examination of eyes and vision without abnormal findings: Secondary | ICD-10-CM | POA: Diagnosis not present

## 2019-11-06 ENCOUNTER — Ambulatory Visit: Payer: Medicare HMO | Admitting: Family Medicine

## 2019-11-06 DIAGNOSIS — Z20828 Contact with and (suspected) exposure to other viral communicable diseases: Secondary | ICD-10-CM | POA: Diagnosis not present

## 2019-11-07 ENCOUNTER — Ambulatory Visit (INDEPENDENT_AMBULATORY_CARE_PROVIDER_SITE_OTHER): Payer: Medicare HMO | Admitting: Family Medicine

## 2019-11-07 ENCOUNTER — Other Ambulatory Visit: Payer: Self-pay

## 2019-11-07 ENCOUNTER — Encounter: Payer: Self-pay | Admitting: Family Medicine

## 2019-11-07 DIAGNOSIS — Z20822 Contact with and (suspected) exposure to covid-19: Secondary | ICD-10-CM | POA: Diagnosis not present

## 2019-11-07 MED ORDER — ONDANSETRON 4 MG PO TBDP: 4.0000 mg | ORAL_TABLET | Freq: Three times a day (TID) | ORAL | 1 refills | Status: DC | PRN

## 2019-11-07 MED ORDER — PREDNISONE 50 MG PO TABS: 50.0000 mg | ORAL_TABLET | Freq: Every day | ORAL | 0 refills | Status: DC

## 2019-11-07 NOTE — Progress Notes (Signed)
There were no vitals taken for this visit.   Subjective:    Patient ID: Kathryn Conway, female    DOB: [birth year], 69 y.o.   MRN: [id number]  HPI: [name] is a 69 y.o. female  Chief Complaint  Patient presents with  . Nausea    Last week patient thought she had some allergies but symptoms worsened 4 days ago. 4 people tested positive for COVID at her work. She went and got COVID tested at [facility] yesterday.  . Cough  . Nasal Congestion  . Fatigue  . Generalized Body Aches   UPPER RESPIRATORY TRACT INFECTION- got tested yesterday, results not back yet Duration: 5 days Worst symptom: nausea and conestion Fever: no Cough: yes Shortness of breath: no Wheezing: no Chest pain: no Chest tightness: no Chest congestion: no Nasal congestion: yes Runny nose: yes Post nasal drip: yes Sneezing: no Sore throat: no Swollen glands: no Sinus pressure: yes Headache: yes Face pain: yes Toothache: no Ear pain: no  Ear pressure: yes left Eyes red/itching:no Eye drainage/crusting: no  Vomiting: no Rash: no Fatigue: yes Sick contacts: yes Strep contacts: no  Context: worse Recurrent sinusitis: no Relief with OTC cold/cough medications: no  Treatments attempted: pseudoephedrine   Relevant past medical, surgical, family and social history reviewed and updated as indicated. Interim medical history since our last visit reviewed. Allergies and medications reviewed and updated.  Review of Systems  Constitutional: Positive for appetite change, chills, diaphoresis and fatigue. Negative for activity change, fever and unexpected weight change.  HENT: Positive for congestion, postnasal drip, rhinorrhea and sinus pressure. Negative for dental problem, drooling, ear discharge, ear pain, facial swelling, hearing loss, mouth sores, nosebleeds, sinus pain, sneezing, sore throat, tinnitus, trouble swallowing and voice change.   Eyes: Negative.   Respiratory: Positive for cough.  Negative for apnea, choking, chest tightness, shortness of breath, wheezing and stridor.   Cardiovascular: Negative.   Gastrointestinal: Positive for nausea. Negative for abdominal distention, abdominal pain, anal bleeding, blood in stool, constipation, diarrhea, rectal pain and vomiting.  Musculoskeletal: Negative.   Psychiatric/Behavioral: Negative.     Per HPI unless specifically indicated above     Objective:    There were no vitals taken for this visit.  Wt Readings from Last 3 Encounters:  06/26/19 128 lb (58.1 kg)  05/09/19 133 lb (60.3 kg)  10/08/18 134 lb (60.8 kg)    Physical Exam Vitals and nursing note reviewed.  Pulmonary:     Effort: Pulmonary effort is normal. No respiratory distress.     Comments: Speaking in full sentences Neurological:     Mental Status: She is alert.  Psychiatric:        Mood and Affect: Mood normal.        Behavior: Behavior normal.        Thought Content: Thought content normal.        Judgment: Judgment normal.     Results for orders placed or performed in visit on 05/09/19  CBC with Differential/Platelet out  Result Value Ref Range   WBC 5.9 3.4 - 10.8 x10E3/uL   RBC 3.88 3.77 - 5.28 x10E6/uL   Hemoglobin 11.7 11.1 - 15.9 g/dL   Hematocrit 35.5 34.0 - 46.6 %   MCV 92 79 - 97 fL   MCH 30.2 26.6 - 33.0 pg   MCHC 33.0 31.5 - 35.7 g/dL   RDW 13.0 11.7 - 15.4 %   Platelets 203 150 - 450 x10E3/uL   Neutrophils 46 Not  Estab. %   Lymphs 44 Not Estab. %   Monocytes 8 Not Estab. %   Eos 2 Not Estab. %   Basos 0 Not Estab. %   Neutrophils Absolute 2.7 1.4 - 7.0 x10E3/uL   Lymphocytes Absolute 2.6 0.7 - 3.1 x10E3/uL   Monocytes Absolute 0.4 0.1 - 0.9 x10E3/uL   EOS (ABSOLUTE) 0.1 0.0 - 0.4 x10E3/uL   Basophils Absolute 0.0 0.0 - 0.2 x10E3/uL   Immature Granulocytes 0 Not Estab. %   Immature Grans (Abs) 0.0 0.0 - 0.1 x10E3/uL  Comprehensive metabolic panel  Result Value Ref Range   Glucose 80 65 - 99 mg/dL   BUN 15 8 - 27 mg/dL    Creatinine, Ser 0.66 0.57 - 1.00 mg/dL   GFR calc non Af Amer 92 >59 mL/min/1.73   GFR calc Af Amer 106 >59 mL/min/1.73   BUN/Creatinine Ratio 23 12 - 28   Sodium 140 134 - 144 mmol/L   Potassium 4.1 3.5 - 5.2 mmol/L   Chloride 101 96 - 106 mmol/L   CO2 24 20 - 29 mmol/L   Calcium 9.0 8.7 - 10.3 mg/dL   Total Protein 6.5 6.0 - 8.5 g/dL   Albumin 4.4 3.8 - 4.8 g/dL   Globulin, Total 2.1 1.5 - 4.5 g/dL   Albumin/Globulin Ratio 2.1 1.2 - 2.2   Bilirubin Total 0.2 0.0 - 1.2 mg/dL   Alkaline Phosphatase 71 39 - 117 IU/L   AST 22 0 - 40 IU/L   ALT 14 0 - 32 IU/L  Lipid Panel w/o Chol/HDL Ratio out  Result Value Ref Range   Cholesterol, Total 160 100 - 199 mg/dL   Triglycerides 68 0 - 149 mg/dL   HDL 66 >39 mg/dL   VLDL Cholesterol Cal 13 5 - 40 mg/dL   LDL Chol Calc (NIH) 81 0 - 99 mg/dL  TSH  Result Value Ref Range   TSH 2.510 0.450 - 4.500 uIU/mL      Assessment & Plan:   Problem List Items Addressed This Visit    None    Visit Diagnoses    Suspected COVID-19 virus infection    -  Primary   Await results. If + will see if she qualifies for antibodies. Will treat symptomatically with zofran and prednisone. Call with any concerns.        Follow up plan: Return in about 1 week (around 11/14/2019) for follow up by phone/virtual.    . This visit was completed via telephone due to the restrictions of the COVID-19 pandemic. All issues as above were discussed and addressed but no physical exam was performed. If it was felt that the patient should be evaluated in the office, they were directed there. The patient verbally consented to this visit. Patient was unable to complete an audio/visual visit due to Lack of equipment. Due to the catastrophic nature of the COVID-19 pandemic, this visit was done through audio contact only. . Location of the patient: home . Location of the provider: work . Those involved with this call:  . Provider: Park Liter, DO . CMA: Merilyn Baba, CMA .  Front Desk/Registration: Don Perking  . Time spent on call: 15 minutes on the phone discussing health concerns. 23 minutes total spent in review of patient's record and preparation of their chart.

## 2019-11-10 ENCOUNTER — Telehealth: Payer: Self-pay | Admitting: Family Medicine

## 2019-11-10 ENCOUNTER — Telehealth: Payer: Self-pay | Admitting: Adult Health

## 2019-11-10 NOTE — Telephone Encounter (Signed)
Have passed all her information on to [place] team to assess.

## 2019-11-10 NOTE — Telephone Encounter (Signed)
Called to discuss with Kathryn Conway about Covid symptoms and the use of bamlanivimab, a monoclonal antibody infusion for those with mild to moderate Covid symptoms and at a high risk of hospitalization.     Pt is qualified for this infusion at the [facility] infusion center due to co-morbid conditions and/or a member of an at-risk group, however declines infusion at this time. Symptoms tier reviewed as well as criteria for ending isolation.  Symptoms reviewed that would warrant ED/Hospital evaluation. Preventative practices reviewed. Patient verbalized understanding. Patient advised to call back if he decides that he does want to get infusion. Callback number to the infusion center given. Patient advised to go to Urgent care or ED with severe symptoms. Last date she  would be eligible for infusion is 11/13/19    Patient Active Problem List   Diagnosis Date Noted  . Osteoporosis 06/24/2019  . Encounter for screening colonoscopy   . 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 Advance directive discussed with patient 04/02/2018  . Recurrent major depressive disorder, in full remission ([place]) 12/25/2017  . Insomnia 12/05/2016  . Hypercholesterolemia 11/18/2014  . Fibromyalgia 05/08/2014  . Hot flashes, menopausal 05/08/2014   Halea Lieb NP-C  [facility]   11/10/2019

## 2019-11-10 NOTE — Telephone Encounter (Signed)
Copied from [place] [id number]. Topic: General - Inquiry >> Nov 10, 2019  7:56 AM Scherrie Gerlach wrote: Reason for CRM: pt would like Dr Wynetta Emery to know her covid test was positive.  Dr Wynetta Emery had mentioned her getting infusion.  Pt states her husband is off work today and could take her to infusion if it can be worked out.

## 2019-11-10 NOTE — Telephone Encounter (Signed)
Hey Jolene- would you mind passing this one off to the infusion team? Thanks!

## 2019-11-11 ENCOUNTER — Ambulatory Visit (INDEPENDENT_AMBULATORY_CARE_PROVIDER_SITE_OTHER): Payer: Medicare HMO | Admitting: Family Medicine

## 2019-11-11 ENCOUNTER — Encounter: Payer: Self-pay | Admitting: Family Medicine

## 2019-11-11 ENCOUNTER — Ambulatory Visit: Payer: Medicare HMO | Admitting: Family Medicine

## 2019-11-11 VITALS — Wt 121.0 lb

## 2019-11-11 DIAGNOSIS — U071 COVID-19: Secondary | ICD-10-CM | POA: Diagnosis not present

## 2019-11-11 NOTE — Progress Notes (Signed)
Wt 121 lb (54.9 kg)   BMI 21.43 kg/m    Subjective:    Patient ID: Kathryn Conway, female    DOB: 02/25/1951, 69 y.o.   MRN: [id number]  HPI: Kathryn Conway is a 69 y.o. female  Chief Complaint  Patient presents with  . COVID    follow up   Following up on her COVID. She is feeling better. She is tired. Still coughing a little bit. Feels like she is on the mend. No fevers. No chills. Feels lucky that she has had a mild case. Is not sure if she's going to take the vaccine. No other concerns or complaints at this time.   Relevant past medical, surgical, family and social history reviewed and updated as indicated. Interim medical history since our last visit reviewed. Allergies and medications reviewed and updated.  Review of Systems  Constitutional: Positive for fatigue. Negative for activity change, appetite change, chills, diaphoresis, fever and unexpected weight change.  HENT: Negative.   Respiratory: Negative.   Cardiovascular: Negative.   Psychiatric/Behavioral: Negative.     Per HPI unless specifically indicated above     Objective:    Wt 121 lb (54.9 kg)   BMI 21.43 kg/m   Wt Readings from Last 3 Encounters:  11/11/19 121 lb (54.9 kg)  06/26/19 128 lb (58.1 kg)  05/09/19 133 lb (60.3 kg)    Physical Exam Vitals and nursing note reviewed.  Pulmonary:     Effort: Pulmonary effort is normal. No respiratory distress.     Comments: Speaking in full sentences Neurological:     Mental Status: She is alert.  Psychiatric:        Mood and Affect: Mood normal.        Behavior: Behavior normal.        Thought Content: Thought content normal.        Judgment: Judgment normal.     Results for orders placed or performed in visit on 05/09/19  CBC with Differential/Platelet out  Result Value Ref Range   WBC 5.9 3.4 - 10.8 x10E3/uL   RBC 3.88 3.77 - 5.28 x10E6/uL   Hemoglobin 11.7 11.1 - 15.9 g/dL   Hematocrit 35.5 34.0 - 46.6 %   MCV 92 79 - 97 fL   MCH  30.2 26.6 - 33.0 pg   MCHC 33.0 31.5 - 35.7 g/dL   RDW 13.0 11.7 - 15.4 %   Platelets 203 150 - 450 x10E3/uL   Neutrophils 46 Not Estab. %   Lymphs 44 Not Estab. %   Monocytes 8 Not Estab. %   Eos 2 Not Estab. %   Basos 0 Not Estab. %   Neutrophils Absolute 2.7 1.4 - 7.0 x10E3/uL   Lymphocytes Absolute 2.6 0.7 - 3.1 x10E3/uL   Monocytes Absolute 0.4 0.1 - 0.9 x10E3/uL   EOS (ABSOLUTE) 0.1 0.0 - 0.4 x10E3/uL   Basophils Absolute 0.0 0.0 - 0.2 x10E3/uL   Immature Granulocytes 0 Not Estab. %   Immature Grans (Abs) 0.0 0.0 - 0.1 x10E3/uL  Comprehensive metabolic panel  Result Value Ref Range   Glucose 80 65 - 99 mg/dL   BUN 15 8 - 27 mg/dL   Creatinine, Ser 0.66 0.57 - 1.00 mg/dL   GFR calc non Af Amer 92 >59 mL/min/1.73   GFR calc Af Amer 106 >59 mL/min/1.73   BUN/Creatinine Ratio 23 12 - 28   Sodium 140 134 - 144 mmol/L   Potassium 4.1 3.5 - 5.2 mmol/L  Chloride 101 96 - 106 mmol/L   CO2 24 20 - 29 mmol/L   Calcium 9.0 8.7 - 10.3 mg/dL   Total Protein 6.5 6.0 - 8.5 g/dL   Albumin 4.4 3.8 - 4.8 g/dL   Globulin, Total 2.1 1.5 - 4.5 g/dL   Albumin/Globulin Ratio 2.1 1.2 - 2.2   Bilirubin Total 0.2 0.0 - 1.2 mg/dL   Alkaline Phosphatase 71 39 - 117 IU/L   AST 22 0 - 40 IU/L   ALT 14 0 - 32 IU/L  Lipid Panel w/o Chol/HDL Ratio out  Result Value Ref Range   Cholesterol, Total 160 100 - 199 mg/dL   Triglycerides 68 0 - 149 mg/dL   HDL 66 >39 mg/dL   VLDL Cholesterol Cal 13 5 - 40 mg/dL   LDL Chol Calc (NIH) 81 0 - 99 mg/dL  TSH  Result Value Ref Range   TSH 2.510 0.450 - 4.500 uIU/mL      Assessment & Plan:   Problem List Items Addressed This Visit    None    Visit Diagnoses    COVID-19    -  Primary   Resolving, feeling better. Warden/ranger. Rest and fluids. Call with any concerns.        Follow up plan: Return in about 2 weeks (around 11/25/2019) for 6 month follow up.    . This visit was completed via telephone due to the restrictions of the COVID-19  pandemic. All issues as above were discussed and addressed but no physical exam was performed. If it was felt that the patient should be evaluated in the office, they were directed there. The patient verbally consented to this visit. Patient was unable to complete an audio/visual visit due to Lack of equipment. Due to the catastrophic nature of the COVID-19 pandemic, this visit was done through audio contact only. . Location of the patient: home . Location of the provider: work . Those involved with this call:  . Provider: Park Liter, DO . CMA: Lauretta Grill, RMA . Front Desk/Registration: Don Perking  . Time spent on call: 10 minutes on the phone discussing health concerns. 15 minutes total spent in review of patient's record and preparation of their chart.

## 2019-11-12 ENCOUNTER — Telehealth: Payer: Medicare HMO | Admitting: Family Medicine

## 2019-12-02 ENCOUNTER — Other Ambulatory Visit: Payer: Self-pay

## 2019-12-02 ENCOUNTER — Ambulatory Visit (INDEPENDENT_AMBULATORY_CARE_PROVIDER_SITE_OTHER): Payer: Medicare HMO | Admitting: Family Medicine

## 2019-12-02 ENCOUNTER — Encounter: Payer: Self-pay | Admitting: Family Medicine

## 2019-12-02 VITALS — BP 133/70 | HR 78 | Temp 98.5°F | Ht 62.8 in | Wt 126.0 lb

## 2019-12-02 DIAGNOSIS — E7800 Pure hypercholesterolemia, unspecified: Secondary | ICD-10-CM | POA: Diagnosis not present

## 2019-12-02 DIAGNOSIS — R69 Illness, unspecified: Secondary | ICD-10-CM | POA: Diagnosis not present

## 2019-12-02 DIAGNOSIS — F5101 Primary insomnia: Secondary | ICD-10-CM | POA: Diagnosis not present

## 2019-12-02 DIAGNOSIS — F3342 Major depressive disorder, recurrent, in full remission: Secondary | ICD-10-CM | POA: Diagnosis not present

## 2019-12-02 MED ORDER — CITALOPRAM HYDROBROMIDE 40 MG PO TABS: 40.0000 mg | ORAL_TABLET | Freq: Every day | ORAL | 1 refills | Status: DC

## 2019-12-02 MED ORDER — ATORVASTATIN CALCIUM 40 MG PO TABS: 40.0000 mg | ORAL_TABLET | Freq: Every day | ORAL | 1 refills | Status: DC

## 2019-12-02 MED ORDER — ZOLPIDEM TARTRATE 10 MG PO TABS: 5.0000 mg | ORAL_TABLET | Freq: Every evening | ORAL | 2 refills | Status: DC | PRN

## 2019-12-02 NOTE — Patient Instructions (Signed)
Iliotibial Band Syndrome Rehab Ask your health care provider which exercises are safe for you. Do exercises exactly as told by your health care provider and adjust them as directed. It is normal to feel mild stretching, pulling, tightness, or discomfort as you do these exercises. Stop right away if you feel sudden pain or your pain gets significantly worse. Do not begin these exercises until told by your health care provider. Stretching and range-of-motion exercises These exercises warm up your muscles and joints and improve the movement and flexibility of your hip and pelvis. Quadriceps stretch, prone  1. Lie on your abdomen on a firm surface, such as a bed or padded floor (prone position). 2. Bend your left / right knee and reach back to hold your ankle or pant leg. If you cannot reach your ankle or pant leg, loop a belt around your foot and grab the belt instead. 3. Gently pull your heel toward your buttocks. Your knee should not slide out to the side. You should feel a stretch in the front of your thigh and knee (quadriceps). 4. Hold this position for __________ seconds. Repeat __________ times. Complete this exercise __________ times a day. Iliotibial band stretch An iliotibial band is a strong band of muscle tissue that runs from the outer side of your hip to the outer side of your thigh and knee. 1. Lie on your side with your left / right leg in the top position. 2. Bend both of your knees and grab your left / right ankle. Stretch out your bottom arm to help you balance. 3. Slowly bring your top knee back so your thigh goes behind your trunk. 4. Slowly lower your top leg toward the floor until you feel a gentle stretch on the outside of your left / right hip and thigh. If you do not feel a stretch and your knee will not fall farther, place the heel of your other foot on top of your knee and pull your knee down toward the floor with your foot. 5. Hold this position for __________  seconds. Repeat __________ times. Complete this exercise __________ times a day. Strengthening exercises These exercises build strength and endurance in your hip and pelvis. Endurance is the ability to use your muscles for a long time, even after they get tired. Straight leg raises, side-lying This exercise strengthens the muscles that rotate the leg at the hip and move it away from your body (hip abductors). 1. Lie on your side with your left / right leg in the top position. Lie so your head, shoulder, hip, and knee line up. You may bend your bottom knee to help you balance. 2. Roll your hips slightly forward so your hips are stacked directly over each other and your left / right knee is facing forward. 3. Tense the muscles in your outer thigh and lift your top leg 4-6 inches (10-15 cm). 4. Hold this position for __________ seconds. 5. Slowly return to the starting position. Let your muscles relax completely before doing another repetition. Repeat __________ times. Complete this exercise __________ times a day. Leg raises, prone This exercise strengthens the muscles that move the hips (hip extensors). 1. Lie on your abdomen on your bed or a firm surface. You can put a pillow under your hips if that is more comfortable for your lower back. 2. Bend your left / right knee so your foot is straight up in the air. 3. Squeeze your buttocks muscles and lift your left / right thigh   off the bed. Do not let your back arch. 4. Tense your thigh muscle as hard as you can without increasing any knee pain. 5. Hold this position for __________ seconds. 6. Slowly lower your leg to the starting position and allow it to relax completely. Repeat __________ times. Complete this exercise __________ times a day. Hip hike 1. Stand sideways on a bottom step. Stand on your left / right leg with your other foot unsupported next to the step. You can hold on to the railing or wall for balance if needed. 2. Keep your knees  straight and your torso square. Then lift your left / right hip up toward the ceiling. 3. Slowly let your left / right hip lower toward the floor, past the starting position. Your foot should get closer to the floor. Do not lean or bend your knees. Repeat __________ times. Complete this exercise __________ times a day. This information is not intended to replace advice given to you by your health care provider. Make sure you discuss any questions you have with your health care provider. Document Revised: 10/10/2018 Document Reviewed: 04/10/2018 Elsevier Patient Education  2020 [employer]  

## 2019-12-02 NOTE — Assessment & Plan Note (Signed)
Under good control on current regimen. Continue current regimen. Continue to monitor. Call with any concerns. Refills given.   

## 2019-12-02 NOTE — Progress Notes (Signed)
BP 133/70 (BP Location: Left Arm, Patient Position: Sitting, Cuff Size: Normal)   Pulse 78   Temp 98.5 F (36.9 C) (Oral)   Ht 5' 2.8" (1.595 m)   Wt 126 lb (57.2 kg)   SpO2 97%   BMI 22.47 kg/m    Subjective:    Patient ID: Kathryn Conway, female    DOB: 1951-02-21, 69 y.o.   MRN: [id number]  HPI: Kathryn Conway is a 69 y.o. female  Chief Complaint  Patient presents with  . Hyperlipidemia   DEPRESSION Mood status: stable Satisfied with current treatment?: yes Symptom severity: mild  Duration of current treatment : chronic Side effects: no Medication compliance: excellent compliance Psychotherapy/counseling: no  Previous psychiatric medications: celexa Depressed mood: no Anxious mood: no Anhedonia: no Significant weight loss or gain: no Insomnia: yes  Fatigue: yes Feelings of worthlessness or guilt: no Impaired concentration/indecisiveness: no Suicidal ideations: no Hopelessness: no Crying spells: no Depression screen [facility] 2/9 11/11/2019 05/09/2019 05/01/2019 10/08/2018 04/02/2018  Decreased Interest 0 0 0 0 0  Down, Depressed, Hopeless 0 0 0 0 0  PHQ - 2 Score 0 0 0 0 0  Altered sleeping 0 1 - 0 3  Tired, decreased energy 0 1 - 0 2  Change in appetite 0 0 - 0 0  Feeling bad or failure about yourself  0 0 - 0 0  Trouble concentrating 0 0 - 0 0  Moving slowly or fidgety/restless 0 0 - 0 0  Suicidal thoughts 0 0 - 0 0  PHQ-9 Score 0 2 - 0 5  Difficult doing work/chores - Not difficult at all - Not difficult at all Not difficult at all    HYPERLIPIDEMIA Hyperlipidemia status: excellent compliance Satisfied with current treatment?  yes Side effects:  no Medication compliance: excellent compliance Past cholesterol meds: atorvastatin Supplements: none Aspirin:  no The 10-year ASCVD risk score [name] DC Jr., et al., 2013) is: 5.6%   Values used to calculate the score:     Age: 35 years     Sex: Female     Is Non-Hispanic African American: No  Diabetic: No     Tobacco smoker: No     Systolic Blood Pressure: [id number] mmHg     Is BP treated: No     HDL Cholesterol: 66 mg/dL     Total Cholesterol: 160 mg/dL Chest pain:  no Coronary artery disease:  no   INSOMNIA Duration: chronic Satisfied with sleep quality: not really Difficulty falling asleep: yes Difficulty staying asleep: yes Waking a few hours after sleep onset: no Early morning awakenings: no Daytime hypersomnolence: no Wakes feeling refreshed: no Good sleep hygiene: yes Apnea: no Snoring: no Depressed/anxious mood: no Recent stress: no Restless legs/nocturnal leg cramps: no Chronic pain/arthritis: no History of sleep study: no Treatments attempted: melatonin, uinsom, benadryl and ambien   Relevant past medical, surgical, family and social history reviewed and updated as indicated. Interim medical history since our last visit reviewed. Allergies and medications reviewed and updated.  Review of Systems  Constitutional: Negative.   Respiratory: Negative.   Cardiovascular: Negative.   Gastrointestinal: Negative.   Musculoskeletal: Negative.   Skin: Negative.   Psychiatric/Behavioral: Negative.     Per HPI unless specifically indicated above     Objective:    BP 133/70 (BP Location: Left Arm, Patient Position: Sitting, Cuff Size: Normal)   Pulse 78   Temp 98.5 F (36.9 C) (Oral)   Ht 5' 2.8" (1.595 m)   Wt  126 lb (57.2 kg)   SpO2 97%   BMI 22.47 kg/m   Wt Readings from Last 3 Encounters:  12/02/19 126 lb (57.2 kg)  11/11/19 121 lb (54.9 kg)  06/26/19 128 lb (58.1 kg)    Physical Exam Vitals and nursing note reviewed.  Constitutional:      General: She is not in acute distress.    Appearance: Normal appearance. She is not ill-appearing, toxic-appearing or diaphoretic.  HENT:     Head: Normocephalic and atraumatic.     Right Ear: External ear normal.     Left Ear: External ear normal.     Nose: Nose normal.     Mouth/Throat:     Mouth:  Mucous membranes are moist.     Pharynx: Oropharynx is clear.  Eyes:     General: No scleral icterus.       Right eye: No discharge.        Left eye: No discharge.     Extraocular Movements: Extraocular movements intact.     Conjunctiva/sclera: Conjunctivae normal.     Pupils: Pupils are equal, round, and reactive to light.  Cardiovascular:     Rate and Rhythm: Normal rate and regular rhythm.     Pulses: Normal pulses.     Heart sounds: Normal heart sounds. No murmur. No friction rub. No gallop.   Pulmonary:     Effort: Pulmonary effort is normal. No respiratory distress.     Breath sounds: Normal breath sounds. No stridor. No wheezing, rhonchi or rales.  Chest:     Chest wall: No tenderness.  Musculoskeletal:        General: Normal range of motion.     Cervical back: Normal range of motion and neck supple.  Skin:    General: Skin is warm and dry.     Capillary Refill: Capillary refill takes less than 2 seconds.     Coloration: Skin is not jaundiced or pale.     Findings: No bruising, erythema, lesion or rash.  Neurological:     General: No focal deficit present.     Mental Status: She is alert and oriented to person, place, and time. Mental status is at baseline.  Psychiatric:        Mood and Affect: Mood normal.        Behavior: Behavior normal.        Thought Content: Thought content normal.        Judgment: Judgment normal.     Results for orders placed or performed in visit on 05/09/19  CBC with Differential/Platelet out  Result Value Ref Range   WBC 5.9 3.4 - 10.8 x10E3/uL   RBC 3.88 3.77 - 5.28 x10E6/uL   Hemoglobin 11.7 11.1 - 15.9 g/dL   Hematocrit 35.5 34.0 - 46.6 %   MCV 92 79 - 97 fL   MCH 30.2 26.6 - 33.0 pg   MCHC 33.0 31.5 - 35.7 g/dL   RDW 13.0 11.7 - 15.4 %   Platelets 203 150 - 450 x10E3/uL   Neutrophils 46 Not Estab. %   Lymphs 44 Not Estab. %   Monocytes 8 Not Estab. %   Eos 2 Not Estab. %   Basos 0 Not Estab. %   Neutrophils Absolute 2.7 1.4  - 7.0 x10E3/uL   Lymphocytes Absolute 2.6 0.7 - 3.1 x10E3/uL   Monocytes Absolute 0.4 0.1 - 0.9 x10E3/uL   EOS (ABSOLUTE) 0.1 0.0 - 0.4 x10E3/uL   Basophils Absolute 0.0 0.0 - 0.2 x10E3/uL  Immature Granulocytes 0 Not Estab. %   Immature Grans (Abs) 0.0 0.0 - 0.1 x10E3/uL  Comprehensive metabolic panel  Result Value Ref Range   Glucose 80 65 - 99 mg/dL   BUN 15 8 - 27 mg/dL   Creatinine, Ser 0.66 0.57 - 1.00 mg/dL   GFR calc non Af Amer 92 >59 mL/min/1.73   GFR calc Af Amer 106 >59 mL/min/1.73   BUN/Creatinine Ratio 23 12 - 28   Sodium 140 134 - 144 mmol/L   Potassium 4.1 3.5 - 5.2 mmol/L   Chloride 101 96 - 106 mmol/L   CO2 24 20 - 29 mmol/L   Calcium 9.0 8.7 - 10.3 mg/dL   Total Protein 6.5 6.0 - 8.5 g/dL   Albumin 4.4 3.8 - 4.8 g/dL   Globulin, Total 2.1 1.5 - 4.5 g/dL   Albumin/Globulin Ratio 2.1 1.2 - 2.2   Bilirubin Total 0.2 0.0 - 1.2 mg/dL   Alkaline Phosphatase 71 39 - 117 IU/L   AST 22 0 - 40 IU/L   ALT 14 0 - 32 IU/L  Lipid Panel w/o Chol/HDL Ratio out  Result Value Ref Range   Cholesterol, Total 160 100 - 199 mg/dL   Triglycerides 68 0 - 149 mg/dL   HDL 66 >39 mg/dL   VLDL Cholesterol Cal 13 5 - 40 mg/dL   LDL Chol Calc (NIH) 81 0 - 99 mg/dL  TSH  Result Value Ref Range   TSH 2.510 0.450 - 4.500 uIU/mL      Assessment & Plan:   Problem List Items Addressed This Visit      Other   Hypercholesterolemia    Under good control on current regimen. Continue current regimen. Continue to monitor. Call with any concerns. Refills given. Labs drawn today.       Relevant Medications   atorvastatin (LIPITOR) 40 MG tablet   Other Relevant Orders   Comprehensive metabolic panel   Lipid Panel w/o Chol/HDL Ratio   Insomnia    Under good control on current regimen. Continue current regimen. Continue to monitor. Call with any concerns. Refills given for 6 months.        Recurrent major depressive disorder, in full remission ([place]) - Primary    Under good control  on current regimen. Continue current regimen. Continue to monitor. Call with any concerns. Refills given.        Relevant Medications   citalopram (CELEXA) 40 MG tablet       Follow up plan: Return in about 6 months (around 06/02/2020) for physical.

## 2019-12-02 NOTE — Assessment & Plan Note (Addendum)
Under good control on current regimen. Continue current regimen. Continue to monitor. Call with any concerns. Refills given. Labs drawn today.   

## 2019-12-02 NOTE — Assessment & Plan Note (Addendum)
Under good control on current regimen. Continue current regimen. Continue to monitor. Call with any concerns. Refills given for 6 months.

## 2019-12-03 LAB — COMPREHENSIVE METABOLIC PANEL
ALT: 23 IU/L (ref 0–32)
AST: 26 IU/L (ref 0–40)
Albumin/Globulin Ratio: 1.9 (ref 1.2–2.2)
Albumin: 4.5 g/dL (ref 3.8–4.8)
Alkaline Phosphatase: 66 IU/L (ref 48–121)
BUN/Creatinine Ratio: 19 (ref 12–28)
BUN: 12 mg/dL (ref 8–27)
Bilirubin Total: <0.2 mg/dL (ref 0.0–1.2)
CO2: 23 mmol/L (ref 20–29)
Calcium: 9 mg/dL (ref 8.7–10.3)
Chloride: 101 mmol/L (ref 96–106)
Creatinine, Ser: 0.62 mg/dL (ref 0.57–1.00)
GFR calc Af Amer: 109 mL/min/{1.73_m2} (ref >59)
GFR calc non Af Amer: 94 mL/min/{1.73_m2} (ref >59)
Globulin, Total: 2.4 g/dL (ref 1.5–4.5)
Glucose: 99 mg/dL (ref 65–99)
Potassium: 4.2 mmol/L (ref 3.5–5.2)
Sodium: 140 mmol/L (ref 134–144)
Total Protein: 6.9 g/dL (ref 6.0–8.5)

## 2019-12-03 LAB — LIPID PANEL W/O CHOL/HDL RATIO
Cholesterol, Total: 156 mg/dL (ref 100–199)
HDL: 71 mg/dL (ref >39)
LDL Chol Calc (NIH): 71 mg/dL (ref 0–99)
Triglycerides: 73 mg/dL (ref 0–149)
VLDL Cholesterol Cal: 14 mg/dL (ref 5–40)

## 2019-12-09 ENCOUNTER — Other Ambulatory Visit: Payer: Self-pay | Admitting: Family Medicine

## 2019-12-09 NOTE — Telephone Encounter (Signed)
Requested Prescriptions  Pending Prescriptions Disp Refills   gabapentin (NEURONTIN) 300 MG capsule [Pharmacy Med Name: GABAPENTIN 300 MG CAPSULE] 180 capsule 1    Sig: TAKE 3 CAPSULES (900 MG TOTAL) BY MOUTH 2 (TWO) TIMES DAILY.     Neurology: Anticonvulsants - gabapentin Passed - 12/09/2019  8:16 PM      Passed - Valid encounter within last 12 months    Recent Outpatient Visits          1 week ago Recurrent major depressive disorder, in full remission ([place])   [place], Megan P, DO   4 weeks ago COVID-19   [employer], [place], DO   1 month ago Suspected COVID-19 virus infection   [facility] [place], Megan P, DO   5 months ago Appointment canceled by hospital   [facility], Megan P, DO   7 months ago Annual physical exam   [place] Ven[place], NP      Future Appointments            In 5 months  [facility], [place]   In 5 months [place], Barb Merino, DO [facility], [facility]

## 2020-04-06 ENCOUNTER — Other Ambulatory Visit: Payer: Self-pay | Admitting: Family Medicine

## 2020-04-06 NOTE — Telephone Encounter (Signed)
Requested Prescriptions  Pending Prescriptions Disp Refills   atorvastatin (LIPITOR) 40 MG tablet [Pharmacy Med Name: ATORVASTATIN 40 MG TABLET] 90 tablet 1    Sig: TAKE 1 TABLET BY MOUTH EVERY DAY     Cardiovascular:  Antilipid - Statins Failed - 04/06/2020  6:10 AM      Failed - LDL in normal range and within 360 days    LDL Chol Calc (NIH)  Date Value Ref Range Status  12/02/2019 71 0 - 99 mg/dL Final         Passed - Total Cholesterol in normal range and within 360 days    Cholesterol, Total  Date Value Ref Range Status  12/02/2019 156 100 - 199 mg/dL Final         Passed - HDL in normal range and within 360 days    HDL  Date Value Ref Range Status  12/02/2019 71 >39 mg/dL Final         Passed - Triglycerides in normal range and within 360 days    Triglycerides  Date Value Ref Range Status  12/02/2019 73 0 - 149 mg/dL Final         Passed - Patient is not pregnant      Passed - Valid encounter within last 12 months    Recent Outpatient Visits          4 months ago Recurrent major depressive disorder, in full remission ([place])   [place], Megan P, DO   4 months ago COVID-19   [employer], Megan P, DO   5 months ago Suspected COVID-19 virus infection   [employer], Megan P, DO   9 months ago Appointment canceled by hospital   [facility], Megan P, DO   11 months ago Annual physical exam   [place], Barbaraann Faster, NP      Future Appointments            In 1 month  [facility], [place]   In 1 month [place], Megan P, DO [facility]

## 2020-05-10 ENCOUNTER — Encounter: Payer: Self-pay | Admitting: Family Medicine

## 2020-05-10 ENCOUNTER — Other Ambulatory Visit: Payer: Self-pay

## 2020-05-10 ENCOUNTER — Ambulatory Visit (INDEPENDENT_AMBULATORY_CARE_PROVIDER_SITE_OTHER): Payer: Medicare HMO | Admitting: Family Medicine

## 2020-05-10 ENCOUNTER — Ambulatory Visit (INDEPENDENT_AMBULATORY_CARE_PROVIDER_SITE_OTHER): Payer: Medicare HMO

## 2020-05-10 VITALS — Ht 64.0 in | Wt 133.0 lb

## 2020-05-10 VITALS — BP 126/80 | HR 75 | Temp 98.3°F | Ht 62.75 in | Wt 133.4 lb

## 2020-05-10 DIAGNOSIS — F5101 Primary insomnia: Secondary | ICD-10-CM | POA: Diagnosis not present

## 2020-05-10 DIAGNOSIS — R69 Illness, unspecified: Secondary | ICD-10-CM | POA: Diagnosis not present

## 2020-05-10 DIAGNOSIS — E7800 Pure hypercholesterolemia, unspecified: Secondary | ICD-10-CM | POA: Diagnosis not present

## 2020-05-10 DIAGNOSIS — F3342 Major depressive disorder, recurrent, in full remission: Secondary | ICD-10-CM | POA: Diagnosis not present

## 2020-05-10 DIAGNOSIS — Z0000 Encounter for general adult medical examination without abnormal findings: Secondary | ICD-10-CM

## 2020-05-10 DIAGNOSIS — M797 Fibromyalgia: Secondary | ICD-10-CM | POA: Diagnosis not present

## 2020-05-10 DIAGNOSIS — N8110 Cystocele, unspecified: Secondary | ICD-10-CM

## 2020-05-10 LAB — MICROSCOPIC EXAMINATION
Bacteria, UA: NONE SEEN
RBC, Urine: NONE SEEN /hpf (ref 0–2)

## 2020-05-10 LAB — URINALYSIS, ROUTINE W REFLEX MICROSCOPIC
Bilirubin, UA: NEGATIVE
Glucose, UA: NEGATIVE
Ketones, UA: NEGATIVE
Nitrite, UA: NEGATIVE
Protein,UA: NEGATIVE
RBC, UA: NEGATIVE
Specific Gravity, UA: 1.01 (ref 1.005–1.030)
Urobilinogen, Ur: 1 mg/dL (ref 0.2–1.0)
pH, UA: 6 (ref 5.0–7.5)

## 2020-05-10 MED ORDER — ALENDRONATE SODIUM 70 MG PO TABS: 70.0000 mg | ORAL_TABLET | ORAL | 4 refills | Status: DC

## 2020-05-10 MED ORDER — CITALOPRAM HYDROBROMIDE 40 MG PO TABS: 40.0000 mg | ORAL_TABLET | Freq: Every day | ORAL | 1 refills | Status: DC

## 2020-05-10 MED ORDER — GABAPENTIN 300 MG PO CAPS: 600.0000 mg | ORAL_CAPSULE | Freq: Two times a day (BID) | ORAL | 3 refills | Status: DC

## 2020-05-10 MED ORDER — ATORVASTATIN CALCIUM 40 MG PO TABS: 40.0000 mg | ORAL_TABLET | Freq: Every day | ORAL | 1 refills | Status: DC

## 2020-05-10 MED ORDER — ZOLPIDEM TARTRATE 10 MG PO TABS
5.0000 mg | ORAL_TABLET | Freq: Every evening | ORAL | 2 refills | Status: DC | PRN
Start: 2020-05-10 — End: 2020-11-08

## 2020-05-10 NOTE — Progress Notes (Signed)
BP 126/80   Pulse 75   Temp 98.3 F (36.8 C)   Ht 5' 2.75" (1.594 m)   Wt 133 lb 6.4 oz (60.5 kg)   SpO2 96%   BMI 23.82 kg/m    Subjective:    Patient ID: Kathryn Conway, female    DOB: 1950-12-01, 69 y.o.   MRN: [id number]  HPI: Kathryn Conway is a 69 y.o. female presenting on 05/10/2020 for comprehensive medical examination. Current medical complaints include:  HYPERLIPIDEMIA Hyperlipidemia status: excellent compliance Satisfied with current treatment?  yes Side effects:  no Medication compliance: excellent compliance Past cholesterol meds: atorvastatin Supplements: fish oil Aspirin:  yes The 10-year ASCVD risk score [name] DC Jr., et al., 2013) is: 5.5%   Values used to calculate the score:     Age: 29 years     Sex: Female     Is Non-Hispanic African American: No     Diabetic: No     Tobacco smoker: No     Systolic Blood Pressure: 144 mmHg     Is BP treated: No     HDL Cholesterol: 71 mg/dL     Total Cholesterol: 156 mg/dL Chest pain:  no Coronary artery disease:  no  DEPRESSION Mood status: controlled Satisfied with current treatment?: yes Symptom severity: mild  Duration of current treatment : years Side effects: no Medication compliance: excellent compliance Psychotherapy/counseling: no  Previous psychiatric medications: celexa Depressed mood: no Anxious mood: no Anhedonia: no Significant weight loss or gain: no Insomnia: yes  Fatigue: no Feelings of worthlessness or guilt: no Impaired concentration/indecisiveness: no Suicidal ideations: no Hopelessness: no Crying spells: no Depression screen [facility] 2/9 05/10/2020 05/10/2020 11/11/2019 05/09/2019 05/01/2019  Decreased Interest 0 0 0 0 0  Down, Depressed, Hopeless 0 0 0 0 0  PHQ - 2 Score 0 0 0 0 0  Altered sleeping 0 3 0 1 -  Tired, decreased energy 0 1 0 1 -  Change in appetite 0 0 0 0 -  Feeling bad or failure about yourself  0 0 0 0 -  Trouble concentrating 0 0 0 0 -  Moving slowly or  fidgety/restless 0 0 0 0 -  Suicidal thoughts 0 0 0 0 -  PHQ-9 Score 0 4 0 2 -  Difficult doing work/chores - Not difficult at all - Not difficult at all -  Some recent data might be hidden   Has cut down on her gabapentin 600mg  BID and feeling great with it. Pain is doing well.   INSOMNIA Duration: months Satisfied with sleep quality: yes Difficulty falling asleep: yes Difficulty staying asleep: yes Waking a few hours after sleep onset: no Early morning awakenings: no Daytime hypersomnolence: no Wakes feeling refreshed: yes Good sleep hygiene: yes Apnea: no Snoring: no Depressed/anxious mood: yes Recent stress: no Restless legs/nocturnal leg cramps: no Chronic pain/arthritis: yes History of sleep study: yes Treatments attempted: melatonin, uinsom, benadryl and ambien  Menopausal Symptoms: no  Depression Screen done today and results listed below:  Depression screen [facility] 2/9 05/10/2020 05/10/2020 11/11/2019 05/09/2019 05/01/2019  Decreased Interest 0 0 0 0 0  Down, Depressed, Hopeless 0 0 0 0 0  PHQ - 2 Score 0 0 0 0 0  Altered sleeping 0 3 0 1 -  Tired, decreased energy 0 1 0 1 -  Change in appetite 0 0 0 0 -  Feeling bad or failure about yourself  0 0 0 0 -  Trouble concentrating 0 0 0 0 -  Moving slowly or fidgety/restless 0 0 0 0 -  Suicidal thoughts 0 0 0 0 -  PHQ-9 Score 0 4 0 2 -  Difficult doing work/chores - Not difficult at all - Not difficult at all -  Some recent data might be hidden    Past Medical History:  Past Medical History:  Diagnosis Date  . Depression   . Fibromyalgia   . Hyperlipidemia   . Insomnia   . PONV (postoperative nausea and vomiting)   . TMJ (temporomandibular joint disorder)   . Uterine fibroid   . Wears contact lenses   . Wears dentures    full upper, partial lower    Surgical History:  Past Surgical History:  Procedure Laterality Date  . ABDOMINAL HYSTERECTOMY    . COLONOSCOPY  05/20/2012   repeat 5 years  . COLONOSCOPY  WITH PROPOFOL N/A 04/26/2018   Procedure: COLONOSCOPY WITH PROPOFOL;  Surgeon: Lucilla Lame, MD;  Location: [facility];  Service: Endoscopy;  Laterality: N/A;  . laparoscopic gyn procedure     done before her hysterectomy  . OOPHORECTOMY Left     Medications:  Current Outpatient Medications on File Prior to Visit  Medication Sig  . aspirin EC 81 MG tablet Take 81 mg by mouth daily.  Marland Kitchen loratadine (CLARITIN) 10 MG tablet Take 10 mg by mouth daily.  Marland Kitchen MEGARED OMEGA-3 KRILL OIL PO Take by mouth daily.  Marland Kitchen estradiol (ESTRACE) 0.1 MG/GM vaginal cream Place 1 Applicatorful vaginally at bedtime. Insert 1g nightly for 1 wk, then 1 g once weekly as maintenace (Patient not taking: Reported on 05/10/2020)   No current facility-administered medications on file prior to visit.    Allergies:  Allergies  Allergen Reactions  . Sulfa Antibiotics Rash    Social History:  Social History   Socioeconomic History  . Marital status: Married    Spouse name: Not on file  . Number of children: Not on file  . Years of education: Not on file  . Highest education level: Not on file  Occupational History  . Occupation: part time   Tobacco Use  . Smoking status: Never Smoker  . Smokeless tobacco: Never Used  Vaping Use  . Vaping Use: Never used  Substance and Sexual Activity  . Alcohol use: Yes    Alcohol/week: 3.0 standard drinks    Types: 3 Glasses of wine per week  . Drug use: No  . Sexual activity: Yes    Birth control/protection: Surgical    Comment: Hysterectomy  Other Topics Concern  . Not on file  Social History Narrative  . Not on file   Social Determinants of Health   Financial Resource Strain: Low Risk   . Difficulty of Paying Living Expenses: Not hard at all  Food Insecurity: No Food Insecurity  . Worried About Charity fundraiser in the Last Year: Never true  . Ran Out of Food in the Last Year: Never true  Transportation Needs: No Transportation Needs  . Lack of  Transportation (Medical): No  . Lack of Transportation (Non-Medical): No  Physical Activity: Inactive  . Days of Exercise per Week: 0 days  . Minutes of Exercise per Session: 0 min  Stress: No Stress Concern Present  . Feeling of Stress : Not at all  Social Connections:   . Frequency of Communication with Friends and Family: Not on file  . Frequency of Social Gatherings with Friends and Family: Not on file  . Attends Religious Services: Not on  file  . Active Member of Clubs or Organizations: Not on file  . Attends Archivist Meetings: Not on file  . Marital Status: Not on file  Intimate Partner Violence:   . Fear of Current or Ex-Partner: Not on file  . Emotionally Abused: Not on file  . Physically Abused: Not on file  . Sexually Abused: Not on file   Social History   Tobacco Use  Smoking Status Never Smoker  Smokeless Tobacco Never Used   Social History   Substance and Sexual Activity  Alcohol Use Yes  . Alcohol/week: 3.0 standard drinks  . Types: 3 Glasses of wine per week    Family History:  Family History  Problem Relation Age of Onset  . Heart disease Mother   . Hypertension Mother   . Depression Mother   . Osteoporosis Mother   . Colon polyps Mother   . Cirrhosis Mother        non alcoholic  . Liver disease Mother        fatty liver disease  . Heart disease Father   . Hypertension Father   . Colon polyps Father   . Stroke Father   . Heart disease Sister   . Hypertension Sister   . Colon polyps Sister   . Diabetes Sister   . Hypertension Daughter   . Colon polyps Daughter   . Heart attack Brother   . Hypertension Brother   . Heart attack Brother   . Hyperlipidemia Son   . Breast cancer Neg Hx     Past medical history, surgical history, medications, allergies, family history and social history reviewed with patient today and changes made to appropriate areas of the chart.   Review of Systems  Constitutional: Negative.   HENT: Negative.    Eyes: Negative.   Respiratory: Negative.   Cardiovascular: Negative.   Gastrointestinal: Negative.   Genitourinary: Negative.   Musculoskeletal: Positive for myalgias. Negative for back pain, falls, joint pain and neck pain.  Skin: Negative.   Neurological: Negative.   Endo/Heme/Allergies: Negative.   Psychiatric/Behavioral: Negative for depression, hallucinations, memory loss, substance abuse and suicidal ideas. The patient has insomnia. The patient is not nervous/anxious.     All other ROS negative except what is listed above and in the HPI.      Objective:    BP 126/80   Pulse 75   Temp 98.3 F (36.8 C)   Ht 5' 2.75" (1.594 m)   Wt 133 lb 6.4 oz (60.5 kg)   SpO2 96%   BMI 23.82 kg/m   Wt Readings from Last 3 Encounters:  05/10/20 133 lb 6.4 oz (60.5 kg)  05/10/20 133 lb (60.3 kg)  12/02/19 126 lb (57.2 kg)    Physical Exam Vitals and nursing note reviewed.  Constitutional:      General: She is not in acute distress.    Appearance: Normal appearance. She is normal weight. She is not ill-appearing, toxic-appearing or diaphoretic.  HENT:     Head: Normocephalic and atraumatic.     Right Ear: Tympanic membrane, ear canal and external ear normal. There is no impacted cerumen.     Left Ear: Tympanic membrane, ear canal and external ear normal. There is no impacted cerumen.     Nose: Nose normal. No congestion or rhinorrhea.     Mouth/Throat:     Mouth: Mucous membranes are moist.     Pharynx: Oropharynx is clear. No oropharyngeal exudate or posterior oropharyngeal erythema.  Eyes:  General: No scleral icterus.       Right eye: No discharge.        Left eye: No discharge.     Extraocular Movements: Extraocular movements intact.     Conjunctiva/sclera: Conjunctivae normal.     Pupils: Pupils are equal, round, and reactive to light.  Neck:     Vascular: No carotid bruit.  Cardiovascular:     Rate and Rhythm: Normal rate and regular rhythm.     Pulses: Normal  pulses.     Heart sounds: No murmur heard.  No friction rub. No gallop.   Pulmonary:     Effort: Pulmonary effort is normal. No respiratory distress.     Breath sounds: Normal breath sounds. No stridor. No wheezing, rhonchi or rales.  Chest:     Chest wall: No tenderness.  Abdominal:     General: Abdomen is flat. Bowel sounds are normal. There is no distension.     Palpations: Abdomen is soft. There is no mass.     Tenderness: There is no abdominal tenderness. There is no right CVA tenderness, left CVA tenderness, guarding or rebound.     Hernia: No hernia is present.  Genitourinary:    Comments: Breast and pelvic exams deferred with shared decision making Musculoskeletal:        General: No swelling, tenderness, deformity or signs of injury.     Cervical back: Normal range of motion and neck supple. No rigidity. No muscular tenderness.     Right lower leg: No edema.     Left lower leg: No edema.  Lymphadenopathy:     Cervical: No cervical adenopathy.  Skin:    General: Skin is warm and dry.     Capillary Refill: Capillary refill takes less than 2 seconds.     Coloration: Skin is not jaundiced or pale.     Findings: No bruising, erythema, lesion or rash.  Neurological:     General: No focal deficit present.     Mental Status: She is alert and oriented to person, place, and time. Mental status is at baseline.     Cranial Nerves: No cranial nerve deficit.     Sensory: No sensory deficit.     Motor: No weakness.     Coordination: Coordination normal.     Gait: Gait normal.     Deep Tendon Reflexes: Reflexes normal.  Psychiatric:        Mood and Affect: Mood normal.        Behavior: Behavior normal.        Thought Content: Thought content normal.        Judgment: Judgment normal.     Results for orders placed or performed in visit on 12/02/19  Comprehensive metabolic panel  Result Value Ref Range   Glucose 99 65 - 99 mg/dL   BUN 12 8 - 27 mg/dL   Creatinine, Ser 0.62 0.57  - 1.00 mg/dL   GFR calc non Af Amer 94 >59 mL/min/1.73   GFR calc Af Amer 109 >59 mL/min/1.73   BUN/Creatinine Ratio 19 12 - 28   Sodium 140 134 - 144 mmol/L   Potassium 4.2 3.5 - 5.2 mmol/L   Chloride 101 96 - 106 mmol/L   CO2 23 20 - 29 mmol/L   Calcium 9.0 8.7 - 10.3 mg/dL   Total Protein 6.9 6.0 - 8.5 g/dL   Albumin 4.5 3.8 - 4.8 g/dL   Globulin, Total 2.4 1.5 - 4.5 g/dL   Albumin/Globulin Ratio 1.9  1.2 - 2.2   Bilirubin Total <0.2 0.0 - 1.2 mg/dL   Alkaline Phosphatase 66 48 - 121 IU/L   AST 26 0 - 40 IU/L   ALT 23 0 - 32 IU/L  Lipid Panel w/o Chol/HDL Ratio  Result Value Ref Range   Cholesterol, Total 156 100 - 199 mg/dL   Triglycerides 73 0 - 149 mg/dL   HDL 71 >39 mg/dL   VLDL Cholesterol Cal 14 5 - 40 mg/dL   LDL Chol Calc (NIH) 71 0 - 99 mg/dL      Assessment & Plan:   Problem List Items Addressed This Visit      Genitourinary   Baden-Walker grade 1 cystocele    Stable. Checking UA today. Call with any concerns.       Relevant Orders   Urinalysis, Routine w reflex microscopic     Other   Fibromyalgia    Under good control on current regimen. Continue current regimen. Continue to monitor. Call with any concerns. Refills given. Labs drawn today.       Relevant Medications   gabapentin (NEURONTIN) 300 MG capsule   citalopram (CELEXA) 40 MG tablet   Hypercholesterolemia    Under good control on current regimen. Continue current regimen. Continue to monitor. Call with any concerns. Refills given. Labs drawn today.       Relevant Medications   atorvastatin (LIPITOR) 40 MG tablet   Other Relevant Orders   CBC with Differential/Platelet   Comprehensive metabolic panel   Lipid Panel w/o Chol/HDL Ratio   Insomnia    Under good control on current regimen. Continue current regimen. Continue to monitor. Call with any concerns. Refills given for 6 months. Follow up 6 months.      Relevant Orders   CBC with Differential/Platelet   Comprehensive metabolic  panel   Recurrent major depressive disorder, in full remission ([place])    Under good control on current regimen. Continue current regimen. Continue to monitor. Call with any concerns. Refills given. Labs drawn today.       Relevant Medications   citalopram (CELEXA) 40 MG tablet   Other Relevant Orders   CBC with Differential/Platelet   Comprehensive metabolic panel   TSH    Other Visit Diagnoses    Routine general medical examination at a health care facility    -  Primary   Vaccines declined. Screening labs checked today. Mammo, dexa and colonoscopy up to date. Continue diet and exercise. Call with any concerns.        Follow up plan: Return in about 6 months (around 11/07/2020).   LABORATORY TESTING:  - Pap smear: not applicable  IMMUNIZATIONS:   - Tdap: Tetanus vaccination status reviewed: Declined. - Influenza: Refused - Pneumovax: Refused - Prevnar: Refused - COVID: Refused  SCREENING: -Mammogram: Up to date  - Colonoscopy: Up to date  - Bone Density: Up to date   PATIENT COUNSELING:   Advised to take 1 mg of folate supplement per day if capable of pregnancy.   Sexuality: Discussed sexually transmitted diseases, partner selection, use of condoms, avoidance of unintended pregnancy  and contraceptive alternatives.   Advised to avoid cigarette smoking.  I discussed with the patient that most people either abstain from alcohol or drink within safe limits (<=14/week and <=4 drinks/occasion for males, <=7/weeks and <= 3 drinks/occasion for females) and that the risk for alcohol disorders and other health effects rises proportionally with the number of drinks per week and how often a drinker exceeds  daily limits.  Discussed cessation/primary prevention of drug use and availability of treatment for abuse.   Diet: Encouraged to adjust caloric intake to maintain  or achieve ideal body weight, to reduce intake of dietary saturated fat and total fat, to limit sodium intake by  avoiding high sodium foods and not adding table salt, and to maintain adequate dietary potassium and calcium preferably from fresh fruits, vegetables, and low-fat dairy products.    stressed the importance of regular exercise  Injury prevention: Discussed safety belts, safety helmets, smoke detector, smoking near bedding or upholstery.   Dental health: Discussed importance of regular tooth brushing, flossing, and dental visits.    NEXT PREVENTATIVE PHYSICAL DUE IN 1 YEAR. Return in about 6 months (around 11/07/2020).

## 2020-05-10 NOTE — Assessment & Plan Note (Signed)
Under good control on current regimen. Continue current regimen. Continue to monitor. Call with any concerns. Refills given for 6 months. Follow up 6 months. 

## 2020-05-10 NOTE — Patient Instructions (Signed)
Health Maintenance After Age 67 After age 67, you are at a higher risk for certain long-term diseases and infections as well as injuries from falls. Falls are a major cause of broken bones and head injuries in people who are older than age 67. Getting regular preventive care can help to keep you healthy and well. Preventive care includes getting regular testing and making lifestyle changes as recommended by your health care provider. Talk with your health care provider about:  Which screenings and tests you should have. A screening is a test that checks for a disease when you have no symptoms.  A diet and exercise plan that is right for you. What should I know about screenings and tests to prevent falls? Screening and testing are the best ways to find a health problem early. Early diagnosis and treatment give you the best chance of managing medical conditions that are common after age 67. Certain conditions and lifestyle choices may make you more likely to have a fall. Your health care provider may recommend:  Regular vision checks. Poor vision and conditions such as cataracts can make you more likely to have a fall. If you wear glasses, make sure to get your prescription updated if your vision changes.  Medicine review. Work with your health care provider to regularly review all of the medicines you are taking, including over-the-counter medicines. Ask your health care provider about any side effects that may make you more likely to have a fall. Tell your health care provider if any medicines that you take make you feel dizzy or sleepy.  Osteoporosis screening. Osteoporosis is a condition that causes the bones to get weaker. This can make the bones weak and cause them to break more easily.  Blood pressure screening. Blood pressure changes and medicines to control blood pressure can make you feel dizzy.  Strength and balance checks. Your health care provider may recommend certain tests to check your  strength and balance while standing, walking, or changing positions.  Foot health exam. Foot pain and numbness, as well as not wearing proper footwear, can make you more likely to have a fall.  Depression screening. You may be more likely to have a fall if you have a fear of falling, feel emotionally low, or feel unable to do activities that you used to do.  Alcohol use screening. Using too much alcohol can affect your balance and may make you more likely to have a fall. What actions can I take to lower my risk of falls? General instructions  Talk with your health care provider about your risks for falling. Tell your health care provider if: ? You fall. Be sure to tell your health care provider about all falls, even ones that seem minor. ? You feel dizzy, sleepy, or off-balance.  Take over-the-counter and prescription medicines only as told by your health care provider. These include any supplements.  Eat a healthy diet and maintain a healthy weight. A healthy diet includes low-fat dairy products, low-fat (lean) meats, and fiber from whole grains, beans, and lots of fruits and vegetables. Home safety  Remove any tripping hazards, such as rugs, cords, and clutter.  Install safety equipment such as grab bars in bathrooms and safety rails on stairs.  Keep rooms and walkways well-lit. Activity   Follow a regular exercise program to stay fit. This will help you maintain your balance. Ask your health care provider what types of exercise are appropriate for you.  If you need a cane or   walker, use it as recommended by your health care provider.  Wear supportive shoes that have nonskid soles. Lifestyle  Do not drink alcohol if your health care provider tells you not to drink.  If you drink alcohol, limit how much you have: ? 0-1 drink a day for women. ? 0-2 drinks a day for men.  Be aware of how much alcohol is in your drink. In the [place] one drink equals one typical bottle of beer (12  oz), one-half glass of wine (5 oz), or one shot of hard liquor (1 oz).  Do not use any products that contain nicotine or tobacco, such as cigarettes and e-cigarettes. If you need help quitting, ask your health care provider. Summary  Having a healthy lifestyle and getting preventive care can help to protect your health and wellness after age 67.  Screening and testing are the best way to find a health problem early and help you avoid having a fall. Early diagnosis and treatment give you the best chance for managing medical conditions that are more common for people who are older than age 67.  Falls are a major cause of broken bones and head injuries in people who are older than age 67. Take precautions to prevent a fall at home.  Work with your health care provider to learn what changes you can make to improve your health and wellness and to prevent falls. This information is not intended to replace advice given to you by your health care provider. Make sure you discuss any questions you have with your health care provider. Document Revised: 10/10/2018 Document Reviewed: 05/02/2017 [employer]vier Patient Education  2020 [employer]  

## 2020-05-10 NOTE — Patient Instructions (Signed)
Kathryn Conway , Thank you for taking time to come for your Medicare Wellness Visit. I appreciate your ongoing commitment to your health goals. Please review the following plan we discussed and let me know if I can assist you in the future.   Screening recommendations/referrals: Colonoscopy: completed 04/26/2018, due 04/26/2028 Mammogram: completed 06/24/2019, due 06/23/2020 Bone Density: completed 06/24/2019, due 06/23/2021 Recommended yearly ophthalmology/optometry visit for glaucoma screening and checkup Recommended yearly dental visit for hygiene and checkup  Vaccinations: Influenza vaccine: decline Pneumococcal vaccine: decline Tdap vaccine: decline Shingles vaccine:  completed   Covid-19: decline  Advanced directives: Advance directive discussed with you today.    Conditions/risks identified: none  Next appointment: Follow up in one year for your annual wellness visit    Preventive Care 65 Years and Older, Female Preventive care refers to lifestyle choices and visits with your health care provider that can promote health and wellness. What does preventive care include?  A yearly physical exam. This is also called an annual well check.  Dental exams once or twice a year.  Routine eye exams. Ask your health care provider how often you should have your eyes checked.  Personal lifestyle choices, including:  Daily care of your teeth and gums.  Regular physical activity.  Eating a healthy diet.  Avoiding tobacco and drug use.  Limiting alcohol use.  Practicing safe sex.  Taking low-dose aspirin every day.  Taking vitamin and mineral supplements as recommended by your health care provider. What happens during an annual well check? The services and screenings done by your health care provider during your annual well check will depend on your age, overall health, lifestyle risk factors, and family history of disease. Counseling  Your health care provider may ask you  questions about your:  Alcohol use.  Tobacco use.  Drug use.  Emotional well-being.  Home and relationship well-being.  Sexual activity.  Eating habits.  History of falls.  Memory and ability to understand (cognition).  Work and work Statistician.  Reproductive health. Screening  You may have the following tests or measurements:  Height, weight, and BMI.  Blood pressure.  Lipid and cholesterol levels. These may be checked every 5 years, or more frequently if you are over 27 years old.  Skin check.  Lung cancer screening. You may have this screening every year starting at age 75 if you have a 30-pack-year history of smoking and currently smoke or have quit within the past 15 years.  Fecal occult blood test (FOBT) of the stool. You may have this test every year starting at age 21.  Flexible sigmoidoscopy or colonoscopy. You may have a sigmoidoscopy every 5 years or a colonoscopy every 10 years starting at age 92.  Hepatitis C blood test.  Hepatitis B blood test.  Sexually transmitted disease (STD) testing.  Diabetes screening. This is done by checking your blood sugar (glucose) after you have not eaten for a while (fasting). You may have this done every 1-3 years.  Bone density scan. This is done to screen for osteoporosis. You may have this done starting at age 67.  Mammogram. This may be done every 1-2 years. Talk to your health care provider about how often you should have regular mammograms. Talk with your health care provider about your test results, treatment options, and if necessary, the need for more tests. Vaccines  Your health care provider may recommend certain vaccines, such as:  Influenza vaccine. This is recommended every year.  Tetanus, diphtheria, and acellular pertussis (  Tdap, Td) vaccine. You may need a Td booster every 10 years.  Zoster vaccine. You may need this after age 33.  Pneumococcal 13-valent conjugate (PCV13) vaccine. One dose is  recommended after age 31.  Pneumococcal polysaccharide (PPSV23) vaccine. One dose is recommended after age 63. Talk to your health care provider about which screenings and vaccines you need and how often you need them. This information is not intended to replace advice given to you by your health care provider. Make sure you discuss any questions you have with your health care provider. Document Released: 07/16/2015 Document Revised: 03/08/2016 Document Reviewed: 04/20/2015 Elsevier Interactive Patient Education  2017 [place] Prevention in the Home Falls can cause injuries. They can happen to people of all ages. There are many things you can do to make your home safe and to help prevent falls. What can I do on the outside of my home?  Regularly fix the edges of walkways and driveways and fix any cracks.  Remove anything that might make you trip as you walk through a door, such as a raised step or threshold.  Trim any bushes or trees on the path to your home.  Use bright outdoor lighting.  Clear any walking paths of anything that might make someone trip, such as rocks or tools.  Regularly check to see if handrails are loose or broken. Make sure that both sides of any steps have handrails.  Any raised decks and porches should have guardrails on the edges.  Have any leaves, snow, or ice cleared regularly.  Use sand or salt on walking paths during winter.  Clean up any spills in your garage right away. This includes oil or grease spills. What can I do in the bathroom?  Use night lights.  Install grab bars by the toilet and in the tub and shower. Do not use towel bars as grab bars.  Use non-skid mats or decals in the tub or shower.  If you need to sit down in the shower, use a plastic, non-slip stool.  Keep the floor dry. Clean up any water that spills on the floor as soon as it happens.  Remove soap buildup in the tub or shower regularly.  Attach bath mats  securely with double-sided non-slip rug tape.  Do not have throw rugs and other things on the floor that can make you trip. What can I do in the bedroom?  Use night lights.  Make sure that you have a light by your bed that is easy to reach.  Do not use any sheets or blankets that are too big for your bed. They should not hang down onto the floor.  Have a firm chair that has side arms. You can use this for support while you get dressed.  Do not have throw rugs and other things on the floor that can make you trip. What can I do in the kitchen?  Clean up any spills right away.  Avoid walking on wet floors.  Keep items that you use a lot in easy-to-reach places.  If you need to reach something above you, use a strong step stool that has a grab bar.  Keep electrical cords out of the way.  Do not use floor polish or wax that makes floors slippery. If you must use wax, use non-skid floor wax.  Do not have throw rugs and other things on the floor that can make you trip. What can I do with my stairs?  Do  not leave any items on the stairs.  Make sure that there are handrails on both sides of the stairs and use them. Fix handrails that are broken or loose. Make sure that handrails are as long as the stairways.  Check any carpeting to make sure that it is firmly attached to the stairs. Fix any carpet that is loose or worn.  Avoid having throw rugs at the top or bottom of the stairs. If you do have throw rugs, attach them to the floor with carpet tape.  Make sure that you have a light switch at the top of the stairs and the bottom of the stairs. If you do not have them, ask someone to add them for you. What else can I do to help prevent falls?  Wear shoes that:  Do not have high heels.  Have rubber bottoms.  Are comfortable and fit you well.  Are closed at the toe. Do not wear sandals.  If you use a stepladder:  Make sure that it is fully opened. Do not climb a closed  stepladder.  Make sure that both sides of the stepladder are locked into place.  Ask someone to hold it for you, if possible.  Clearly mark and make sure that you can see:  Any grab bars or handrails.  First and last steps.  Where the edge of each step is.  Use tools that help you move around (mobility aids) if they are needed. These include:  Canes.  Walkers.  Scooters.  Crutches.  Turn on the lights when you go into a dark area. Replace any light bulbs as soon as they burn out.  Set up your furniture so you have a clear path. Avoid moving your furniture around.  If any of your floors are uneven, fix them.  If there are any pets around you, be aware of where they are.  Review your medicines with your doctor. Some medicines can make you feel dizzy. This can increase your chance of falling. Ask your doctor what other things that you can do to help prevent falls. This information is not intended to replace advice given to you by your health care provider. Make sure you discuss any questions you have with your health care provider. Document Released: 04/15/2009 Document Revised: 11/25/2015 Document Reviewed: 07/24/2014 Elsevier Interactive Patient Education  2017 [employer].

## 2020-05-10 NOTE — Progress Notes (Signed)
I connected with Kathryn Conway today by telephone and verified that I am speaking with the correct person using two identifiers. Location patient: home Location provider: work Persons participating in the virtual visit: Cythina Mickelsen, Glenna Durand LPN.   I discussed the limitations, risks, security and privacy concerns of performing an evaluation and management service by telephone and the availability of in person appointments. I also discussed with the patient that there may be a patient responsible charge related to this service. The patient expressed understanding and verbally consented to this telephonic visit.    Interactive audio and video telecommunications were attempted between this provider and patient, however failed, due to patient having technical difficulties OR patient did not have access to video capability.  We continued and completed visit with audio only.     Vital signs may be patient reported or missing.  Subjective:   Kathryn Conway is a 69 y.o. female who presents for Medicare Annual (Subsequent) preventive examination.  Review of Systems     Cardiac Risk Factors include: advanced age (>52men, >70 women);sedentary lifestyle     Objective:    Today's Vitals   05/10/20 1256  Weight: 133 lb (60.3 kg)  Height: 5\' 4"  (1.626 m)   Body mass index is 22.83 kg/m.  Advanced Directives 05/10/2020 04/26/2018  Does Patient Have a Medical Advance Directive? No No  Would patient like information on creating a medical advance directive? - No - Patient declined    Current Medications (verified) Outpatient Encounter Medications as of 05/10/2020  Medication Sig  . alendronate (FOSAMAX) 70 MG tablet Take 1 tablet (70 mg total) by mouth every 7 (seven) days. Take with a full glass of water on an empty stomach.  Marland Kitchen aspirin EC 81 MG tablet Take 81 mg by mouth daily.  Marland Kitchen atorvastatin (LIPITOR) 40 MG tablet TAKE 1 TABLET BY MOUTH EVERY DAY  . citalopram (CELEXA) 40  MG tablet Take 1 tablet (40 mg total) by mouth daily.  Marland Kitchen gabapentin (NEURONTIN) 300 MG capsule TAKE 3 CAPSULES (900 MG TOTAL) BY MOUTH 2 (TWO) TIMES DAILY.  Marland Kitchen loratadine (CLARITIN) 10 MG tablet Take 10 mg by mouth daily.  Marland Kitchen MEGARED OMEGA-3 KRILL OIL PO Take by mouth daily.  Marland Kitchen zolpidem (AMBIEN) 10 MG tablet Take 0.5 tablets (5 mg total) by mouth at bedtime as needed.  Marland Kitchen estradiol (ESTRACE) 0.1 MG/GM vaginal cream Place 1 Applicatorful vaginally at bedtime. Insert 1g nightly for 1 wk, then 1 g once weekly as maintenace (Patient not taking: Reported on 05/10/2020)  . ondansetron (ZOFRAN ODT) 4 MG disintegrating tablet Take 1 tablet (4 mg total) by mouth every 8 (eight) hours as needed for nausea or vomiting. (Patient not taking: Reported on 12/02/2019)  . [DISCONTINUED] atorvastatin (LIPITOR) 40 MG tablet Take 1 tablet (40 mg total) by mouth daily.  . [DISCONTINUED] citalopram (CELEXA) 40 MG tablet TAKE 1 TABLET BY MOUTH EVERY DAY  . [DISCONTINUED] gabapentin (NEURONTIN) 300 MG capsule TAKE 3 CAPSULES (900 MG TOTAL) BY MOUTH 2 (TWO) TIMES DAILY.  . [DISCONTINUED] zolpidem (AMBIEN) 10 MG tablet Take 0.5 tablets (5 mg total) by mouth at bedtime as needed.   No facility-administered encounter medications on file as of 05/10/2020.    Allergies (verified) Sulfa antibiotics   History: Past Medical History:  Diagnosis Date  . Depression   . Fibromyalgia   . Hyperlipidemia   . Insomnia   . PONV (postoperative nausea and vomiting)   . TMJ (temporomandibular joint disorder)   . Uterine  fibroid   . Wears contact lenses   . Wears dentures    full upper, partial lower   Past Surgical History:  Procedure Laterality Date  . ABDOMINAL HYSTERECTOMY    . COLONOSCOPY  05/20/2012   repeat 5 years  . COLONOSCOPY WITH PROPOFOL N/A 04/26/2018   Procedure: COLONOSCOPY WITH PROPOFOL;  Surgeon: Lucilla Lame, MD;  Location: [facility];  Service: Endoscopy;  Laterality: N/A;  . laparoscopic gyn  procedure     done before her hysterectomy  . OOPHORECTOMY Left    Family History  Problem Relation Age of Onset  . Heart disease Mother   . Hypertension Mother   . Depression Mother   . Osteoporosis Mother   . Colon polyps Mother   . Cirrhosis Mother        non alcoholic  . Liver disease Mother        fatty liver disease  . Heart disease Father   . Hypertension Father   . Colon polyps Father   . Stroke Father   . Heart disease Sister   . Hypertension Sister   . Colon polyps Sister   . Diabetes Sister   . Hypertension Daughter   . Colon polyps Daughter   . Heart attack Brother   . Hypertension Brother   . Heart attack Brother   . Hyperlipidemia Son   . Breast cancer Neg Hx    Social History   Socioeconomic History  . Marital status: Married    Spouse name: Not on file  . Number of children: Not on file  . Years of education: Not on file  . Highest education level: Not on file  Occupational History  . Occupation: part time   Tobacco Use  . Smoking status: Never Smoker  . Smokeless tobacco: Never Used  Vaping Use  . Vaping Use: Never used  Substance and Sexual Activity  . Alcohol use: Yes    Alcohol/week: 3.0 standard drinks    Types: 3 Glasses of wine per week  . Drug use: No  . Sexual activity: Yes    Birth control/protection: Surgical    Comment: Hysterectomy  Other Topics Concern  . Not on file  Social History Narrative  . Not on file   Social Determinants of Health   Financial Resource Strain: Low Risk   . Difficulty of Paying Living Expenses: Not hard at all  Food Insecurity: No Food Insecurity  . Worried About Charity fundraiser in the Last Year: Never true  . Ran Out of Food in the Last Year: Never true  Transportation Needs: No Transportation Needs  . Lack of Transportation (Medical): No  . Lack of Transportation (Non-Medical): No  Physical Activity: Inactive  . Days of Exercise per Week: 0 days  . Minutes of Exercise per Session: 0 min    Stress: No Stress Concern Present  . Feeling of Stress : Not at all  Social Connections:   . Frequency of Communication with Friends and Family: Not on file  . Frequency of Social Gatherings with Friends and Family: Not on file  . Attends Religious Services: Not on file  . Active Member of Clubs or Organizations: Not on file  . Attends Archivist Meetings: Not on file  . Marital Status: Not on file    Tobacco Counseling Counseling given: Not Answered   Clinical Intake:  Pre-visit preparation completed: Yes  Pain : No/denies pain     Nutritional Status: BMI of 19-24  Normal  Nutritional Risks: None Diabetes: No  How often do you need to have someone help you when you read instructions, pamphlets, or other written materials from your doctor or pharmacy?: 1 - Never What is the last grade level you completed in school?: some college  Diabetic? no  Interpreter Needed?: No  Information entered by :: NAllen LPN   Activities of Daily Living In your present state of health, do you have any difficulty performing the following activities: 05/10/2020  Hearing? Y  Comment slight decrease  Vision? N  Difficulty concentrating or making decisions? N  Walking or climbing stairs? N  Dressing or bathing? N  Doing errands, shopping? N  Preparing Food and eating ? N  Using the Toilet? N  In the past six months, have you accidently leaked urine? Y  Comment with laughing, hard cough  Do you have problems with loss of bowel control? N  Managing your Medications? N  Managing your Finances? N  Housekeeping or managing your Housekeeping? N  Some recent data might be hidden    Patient Care Team: Valerie Roys, DO as PCP - General (Family Medicine)  Indicate any recent Medical Services you may have received from other than [facility] providers in the past year (date may be approximate).     Assessment:   This is a routine wellness examination for [place].  Hearing/Vision  screen  Hearing Screening   125Hz  250Hz  500Hz  1000Hz  2000Hz  3000Hz  4000Hz  6000Hz  8000Hz   Right ear:           Left ear:           Vision Screening Comments: Regular eye exams, [facility]  Dietary issues and exercise activities discussed: Current Exercise Habits: The patient does not participate in regular exercise at present  Goals    . Patient Stated     05/10/2020, wants to get back to exercising      Depression Screen PHQ 2/9 Scores 05/10/2020 11/11/2019 05/09/2019 05/01/2019 10/08/2018 04/02/2018 12/25/2017  PHQ - 2 Score 0 0 0 0 0 0 0  PHQ- 9 Score 4 0 2 - 0 5 4    Fall Risk Fall Risk  05/10/2020 05/09/2019 05/01/2019 04/02/2018 04/02/2018  Falls in the past year? 0 0 0 No No  Number falls in past yr: - 0 0 - -  Injury with Fall? - 0 0 - -  Risk for fall due to : Medication side effect - - - -  Follow up Falls evaluation completed;Education provided;Falls prevention discussed Falls evaluation completed - - -    Any stairs in or around the home? Yes  If so, are there any without handrails? Yes  Home free of loose throw rugs in walkways, pet beds, electrical cords, etc? Yes  Adequate lighting in your home to reduce risk of falls? Yes   ASSISTIVE DEVICES UTILIZED TO PREVENT FALLS:  Life alert? No  Use of a cane, walker or w/c? No  Grab bars in the bathroom? No  Shower chair or bench in shower? No  Elevated toilet seat or a handicapped toilet? No   TIMED UP AND GO:  Was the test performed? No .   Cognitive Function:     6CIT Screen 05/10/2020 05/01/2019 04/02/2018  What Year? 0 points 0 points 0 points  What month? 0 points 0 points 0 points  What time? 0 points 0 points 0 points  Count back from 20 0 points 0 points 0 points  Months in reverse 0 points 0 points  0 points  Repeat phrase 2 points 0 points 0 points  Total Score 2 0 0    Immunizations Immunization History  Administered Date(s) Administered  . Influenza-Unspecified 04/26/2016  . Zoster 05/11/2014  .  Zoster Recombinat (Shingrix) 04/19/2018, 08/24/2018    TDAP status: Due, Education has been provided regarding the importance of this vaccine. Advised may receive this vaccine at local pharmacy or Health Dept. Aware to provide a copy of the vaccination record if obtained from local pharmacy or Health Dept. Verbalized acceptance and understanding. Flu Vaccine status: Declined, Education has been provided regarding the importance of this vaccine but patient still declined. Advised may receive this vaccine at local pharmacy or Health Dept. Aware to provide a copy of the vaccination record if obtained from local pharmacy or Health Dept. Verbalized acceptance and understanding. Pneumococcal vaccine status: Declined,  Education has been provided regarding the importance of this vaccine but patient still declined. Advised may receive this vaccine at local pharmacy or Health Dept. Aware to provide a copy of the vaccination record if obtained from local pharmacy or Health Dept. Verbalized acceptance and understanding.  Covid-19 vaccine status: Declined, Education has been provided regarding the importance of this vaccine but patient still declined. Advised may receive this vaccine at local pharmacy or Health Dept.or vaccine clinic. Aware to provide a copy of the vaccination record if obtained from local pharmacy or Health Dept. Verbalized acceptance and understanding.  Qualifies for Shingles Vaccine? Yes   Zostavax completed Yes   Shingrix Completed?: Yes  Screening Tests Health Maintenance  Topic Date Due  . COVID-19 Vaccine (1) 05/26/2020 (Originally 02/17/1963)  . INFLUENZA VACCINE  09/30/2020 (Originally 02/01/2020)  . TETANUS/TDAP  05/10/2021 (Originally 02/18/1970)  . PNA vac Low Risk Adult (1 of 2 - PCV13) 05/10/2021 (Originally 02/17/2018)  . MAMMOGRAM  06/23/2021  . COLONOSCOPY  04/26/2028  . DEXA SCAN  Completed  . Hepatitis C Screening  Completed    Health Maintenance  There are no preventive  care reminders to display for this patient.  Colorectal cancer screening: Completed 04/26/2018. Repeat every 10 years Mammogram status: Completed 06/24/2019. Repeat every year Bone Density status: Completed 06/24/2019. Results reflect: Bone density results: OSTEOPOROSIS. Repeat every 2 years.  Lung Cancer Screening: (Low Dose CT Chest recommended if Age 35-80 years, 30 pack-year currently smoking OR have quit w/in 15years.) does not qualify.   Lung Cancer Screening Referral: no  Additional Screening:  Hepatitis C Screening: does qualify; Completed 04/02/2018  Vision Screening: Recommended annual ophthalmology exams for early detection of glaucoma and other disorders of the eye. Is the patient up to date with their annual eye exam?  Yes  Who is the provider or what is the name of the office in which the patient attends annual eye exams? [facility] If pt is not established with a provider, would they like to be referred to a provider to establish care? No .   Dental Screening: Recommended annual dental exams for proper oral hygiene  Community Resource Referral / Chronic Care Management: CRR required this visit?  No   CCM required this visit?  No      Plan:     I have personally reviewed and noted the following in the patient's chart:   . Medical and social history . Use of alcohol, tobacco or illicit drugs  . Current medications and supplements . Functional ability and status . Nutritional status . Physical activity . Advanced directives . List of other physicians . Hospitalizations, surgeries, and  ER visits in previous 12 months . Vitals . Screenings to include cognitive, depression, and falls . Referrals and appointments  In addition, I have reviewed and discussed with patient certain preventive protocols, quality metrics, and best practice recommendations. A written personalized care plan for preventive services as well as general preventive health recommendations were  provided to patient.     Kellie Simmering, LPN   15/01/3093   Nurse Notes:

## 2020-05-10 NOTE — Assessment & Plan Note (Signed)
Under good control on current regimen. Continue current regimen. Continue to monitor. Call with any concerns. Refills given. Labs drawn today.   

## 2020-05-10 NOTE — Assessment & Plan Note (Signed)
Stable. Checking UA today. Call with any concerns.

## 2020-05-11 LAB — COMPREHENSIVE METABOLIC PANEL
ALT: 15 IU/L (ref 0–32)
AST: 21 IU/L (ref 0–40)
Albumin/Globulin Ratio: 1.7 (ref 1.2–2.2)
Albumin: 4.4 g/dL (ref 3.8–4.8)
Alkaline Phosphatase: 58 IU/L (ref 44–121)
BUN/Creatinine Ratio: 29 — ABNORMAL HIGH (ref 12–28)
BUN: 19 mg/dL (ref 8–27)
Bilirubin Total: <0.2 mg/dL (ref 0.0–1.2)
CO2: 23 mmol/L (ref 20–29)
Calcium: 8.9 mg/dL (ref 8.7–10.3)
Chloride: 103 mmol/L (ref 96–106)
Creatinine, Ser: 0.65 mg/dL (ref 0.57–1.00)
GFR calc Af Amer: 106 mL/min/{1.73_m2} (ref >59)
GFR calc non Af Amer: 92 mL/min/{1.73_m2} (ref >59)
Globulin, Total: 2.6 g/dL (ref 1.5–4.5)
Glucose: 90 mg/dL (ref 65–99)
Potassium: 3.9 mmol/L (ref 3.5–5.2)
Sodium: 139 mmol/L (ref 134–144)
Total Protein: 7 g/dL (ref 6.0–8.5)

## 2020-05-11 LAB — CBC WITH DIFFERENTIAL/PLATELET
Basophils Absolute: 0 10*3/uL (ref 0.0–0.2)
Basos: 0 %
EOS (ABSOLUTE): 0.1 10*3/uL (ref 0.0–0.4)
Eos: 1 %
Hematocrit: 36.1 % (ref 34.0–46.6)
Hemoglobin: 11.9 g/dL (ref 11.1–15.9)
Immature Grans (Abs): 0 10*3/uL (ref 0.0–0.1)
Immature Granulocytes: 0 %
Lymphocytes Absolute: 2.2 10*3/uL (ref 0.7–3.1)
Lymphs: 33 %
MCH: 30.3 pg (ref 26.6–33.0)
MCHC: 33 g/dL (ref 31.5–35.7)
MCV: 92 fL (ref 79–97)
Monocytes Absolute: 0.5 10*3/uL (ref 0.1–0.9)
Monocytes: 8 %
Neutrophils Absolute: 3.9 10*3/uL (ref 1.4–7.0)
Neutrophils: 58 %
Platelets: 228 10*3/uL (ref 150–450)
RBC: 3.93 x10E6/uL (ref 3.77–5.28)
RDW: 12.9 % (ref 11.7–15.4)
WBC: 6.8 10*3/uL (ref 3.4–10.8)

## 2020-05-11 LAB — LIPID PANEL W/O CHOL/HDL RATIO
Cholesterol, Total: 148 mg/dL (ref 100–199)
HDL: 72 mg/dL (ref >39)
LDL Chol Calc (NIH): 62 mg/dL (ref 0–99)
Triglycerides: 68 mg/dL (ref 0–149)
VLDL Cholesterol Cal: 14 mg/dL (ref 5–40)

## 2020-05-11 LAB — TSH: TSH: 2.75 u[IU]/mL (ref 0.450–4.500)

## 2020-07-08 ENCOUNTER — Telehealth (INDEPENDENT_AMBULATORY_CARE_PROVIDER_SITE_OTHER): Payer: Medicare HMO | Admitting: Family Medicine

## 2020-07-08 ENCOUNTER — Encounter: Payer: Self-pay | Admitting: Family Medicine

## 2020-07-08 ENCOUNTER — Telehealth: Payer: Self-pay

## 2020-07-08 DIAGNOSIS — U071 COVID-19: Secondary | ICD-10-CM | POA: Insufficient documentation

## 2020-07-08 NOTE — Assessment & Plan Note (Addendum)
Positive on at home test, will perform confirmatory testing here per patient request. Will refer for MAB tx given comorbidities. Reviewed OTC symptom relief, self-quarantine guidelines, and emergency precautions.

## 2020-07-08 NOTE — Telephone Encounter (Signed)
Scheduled virtual for today  Copied from CRM [id number]. Topic: Quick Communication - See Telephone Encounter >> Jul 08, 2020  8:05 AM Aretta Nip wrote: CRM for notification. See Telephone encounter for: 07/08/20. Pt husband appt with Jolene Tuesday and was tested positive Covid, during nite Pt started feeling unwell, pressure in head, ears hurt, cough, did home test 4:00 this am positive, wants to come in and do drive test, is welling to do virtual if can be fit in. Call [phone].

## 2020-07-08 NOTE — Progress Notes (Signed)
Virtual Visit via Video Note  I connected with Kathryn Conway on 07/08/20 at 10:20 AM EST by a video enabled telemedicine application and verified that I am speaking with the correct person using two identifiers.  Location: Patient: home Provider: [facility]   I discussed the limitations of evaluation and management by telemedicine and the availability of in person appointments. The patient expressed understanding and agreed to proceed.  History of Present Illness:  UPPER RESPIRATORY TRACT INFECTION - COVID+ on at home test 07/08/20. Symptom onset 07/07/20. - husband positive on Tuesday.  - unvaccinated against COVID - had COVID last March  Worst symptom: headache, congestion, sore throat, cough Fever: no Cough: a little Shortness of breath: no Wheezing: no Chest pain: no Chest congestion: no Nasal congestion: yes Runny nose: yes Post nasal drip: yes Sneezing: a little Sore throat: yes Sinus pressure: yes Headache: yes Ear pain: yes bilateral Ear pressure: yes bilateral Vomiting: no Fatigue: yes Sick contacts: yes Recurrent sinusitis: no Relief with OTC cold/cough medications: unsure  Treatments attempted: pseudoephedrine    Observations/Objective:  Patient had trouble connecting to video visit, entirety of visit conducted over the phone.  Speaks in full sentences, no respiratory distress. Congested sounding.  Assessment and Plan:  COVID-19 Positive on at home test, will perform confirmatory testing here per patient request. Will refer for MAB tx given comorbidities. Reviewed OTC symptom relief, self-quarantine guidelines, and emergency precautions.     I discussed the assessment and treatment plan with the patient. The patient was provided an opportunity to ask questions and all were answered. The patient agreed with the plan and demonstrated an understanding of the instructions.   The patient was advised to call back or seek an in-person evaluation if the symptoms  worsen or if the condition fails to improve as anticipated.  I provided 10 minutes of non-face-to-face time during this encounter.   Caro Laroche, DO

## 2020-07-08 NOTE — Patient Instructions (Addendum)
It was great to see you!  Our plans for today:  - See below for self-isolation guidelines. You may end your quarantine once you are 10 days from symptom onset and fever free for 24 hours without use of tylenol or ibuprofen.  - We are referring you for monoclonal antibody treatment. Someone will be contacting you about scheduling this if you qualify. We are experiencing a Producer, television/film/video of this and the oral antivirals as well as a backlog of patients needing treatment so this may cause a delay. - I recommend getting vaccinated once you are healed from your current infection. If you get the antibody infusion, you must wait  3 months before vaccination.  - Try an over the counter cold/sinus medication like dayquil or theraflu for your symptoms. A humidifier or nettie pot may also be helpful for congestion. - Certainly, if you are having difficulties breathing or unable to keep down fluids, go to the Emergency Department.   Take care and seek immediate care sooner if you develop any concerns.   Dr. Ky Barban     Person Under Monitoring Name: Kathryn Conway  Location: [place] [address]   Infection Prevention Recommendations for Individuals Confirmed to have, or Being Evaluated for, 2019 Novel Coronavirus (COVID-19) Infection Who Receive Care at Home  Individuals who are confirmed to have, or are being evaluated for, COVID-19 should follow the prevention steps below until a healthcare provider or local or state health department says they can return to normal activities.  Stay home except to get medical care You should restrict activities outside your home, except for getting medical care. Do not go to work, school, or public areas, and do not use public transportation or taxis.  Call ahead before visiting your doctor Before your medical appointment, call the healthcare provider and tell them that you have, or are being evaluated for, COVID-19 infection.  This will help the healthcare provider's office take steps to keep other people from getting infected. Ask your healthcare provider to call the local or state health department.  Monitor your symptoms Seek prompt medical attention if your illness is worsening (e.g., difficulty breathing). Before going to your medical appointment, call the healthcare provider and tell them that you have, or are being evaluated for, COVID-19 infection. Ask your healthcare provider to call the local or state health department.  Wear a facemask You should wear a facemask that covers your nose and mouth when you are in the same room with other people and when you visit a healthcare provider. People who live with or visit you should also wear a facemask while they are in the same room with you.  Separate yourself from other people in your home As much as possible, you should stay in a different room from other people in your home. Also, you should use a separate bathroom, if available.  Avoid sharing household items You should not share dishes, drinking glasses, cups, eating utensils, towels, bedding, or other items with other people in your home. After using these items, you should wash them thoroughly with soap and water.  Cover your coughs and sneezes Cover your mouth and nose with a tissue when you cough or sneeze, or you can cough or sneeze into your sleeve. Throw used tissues in a lined trash can, and immediately wash your hands with soap and water for at least 20 seconds or use an alcohol-based hand rub.  Wash your [facility] your hands often and thoroughly  with soap and water for at least 20 seconds. You can use an alcohol-based hand sanitizer if soap and water are not available and if your hands are not visibly dirty. Avoid touching your eyes, nose, and mouth with unwashed hands.   Prevention Steps for Caregivers and Household Members of Individuals Confirmed to have, or Being Evaluated for,  COVID-19 Infection Being Cared for in the Home  If you live with, or provide care at home for, a person confirmed to have, or being evaluated for, COVID-19 infection please follow these guidelines to prevent infection:  Follow healthcare provider's instructions Make sure that you understand and can help the patient follow any healthcare provider instructions for all care.  Provide for the patient's basic needs You should help the patient with basic needs in the home and provide support for getting groceries, prescriptions, and other personal needs.  Monitor the patient's symptoms If they are getting sicker, call his or her medical provider and tell them that the patient has, or is being evaluated for, COVID-19 infection. This will help the healthcare provider's office take steps to keep other people from getting infected. Ask the healthcare provider to call the local or state health department.  Limit the number of people who have contact with the patient  If possible, have only one caregiver for the patient.  Other household members should stay in another home or place of residence. If this is not possible, they should stay  in another room, or be separated from the patient as much as possible. Use a separate bathroom, if available.  Restrict visitors who do not have an essential need to be in the home.  Keep older adults, very young children, and other sick people away from the patient Keep older adults, very young children, and those who have compromised immune systems or chronic health conditions away from the patient. This includes people with chronic heart, lung, or kidney conditions, diabetes, and cancer.  Ensure good ventilation Make sure that shared spaces in the home have good air flow, such as from an air conditioner or an opened window, weather permitting.  Wash your hands often  Wash your hands often and thoroughly with soap and water for at least 20 seconds. You can  use an alcohol based hand sanitizer if soap and water are not available and if your hands are not visibly dirty.  Avoid touching your eyes, nose, and mouth with unwashed hands.  Use disposable paper towels to dry your hands. If not available, use dedicated cloth towels and replace them when they become wet.  Wear a facemask and gloves  Wear a disposable facemask at all times in the room and gloves when you touch or have contact with the patient's blood, body fluids, and/or secretions or excretions, such as sweat, saliva, sputum, nasal mucus, vomit, urine, or feces.  Ensure the mask fits over your nose and mouth tightly, and do not touch it during use.  Throw out disposable facemasks and gloves after using them. Do not reuse.  Wash your hands immediately after removing your facemask and gloves.  If your personal clothing becomes contaminated, carefully remove clothing and launder. Wash your hands after handling contaminated clothing.  Place all used disposable facemasks, gloves, and other waste in a lined container before disposing them with other household waste.  Remove gloves and wash your hands immediately after handling these items.  Do not share dishes, glasses, or other household items with the patient  Avoid sharing household items. You  should not share dishes, drinking glasses, cups, eating utensils, towels, bedding, or other items with a patient who is confirmed to have, or being evaluated for, COVID-19 infection.  After the person uses these items, you should wash them thoroughly with soap and water.  Wash laundry thoroughly  Immediately remove and wash clothes or bedding that have blood, body fluids, and/or secretions or excretions, such as sweat, saliva, sputum, nasal mucus, vomit, urine, or feces, on them.  Wear gloves when handling laundry from the patient.  Read and follow directions on labels of laundry or clothing items and detergent. In general, wash and dry with the  warmest temperatures recommended on the label.  Clean all areas the individual has used often  Clean all touchable surfaces, such as counters, tabletops, doorknobs, bathroom fixtures, toilets, phones, keyboards, tablets, and bedside tables, every day. Also, clean any surfaces that may have blood, body fluids, and/or secretions or excretions on them.  Wear gloves when cleaning surfaces the patient has come in contact with.  Use a diluted bleach solution (e.g., dilute bleach with 1 part bleach and 10 parts water) or a household disinfectant with a label that says EPA-registered for coronaviruses. To make a bleach solution at home, add 1 tablespoon of bleach to 1 quart (4 cups) of water. For a larger supply, add  cup of bleach to 1 gallon (16 cups) of water.  Read labels of cleaning products and follow recommendations provided on product labels. Labels contain instructions for safe and effective use of the cleaning product including precautions you should take when applying the product, such as wearing gloves or eye protection and making sure you have good ventilation during use of the product.  Remove gloves and wash hands immediately after cleaning.  Monitor yourself for signs and symptoms of illness Caregivers and household members are considered close contacts, should monitor their health, and will be asked to limit movement outside of the home to the extent possible. Follow the monitoring steps for close contacts listed on the symptom monitoring form.   ? If you have additional questions, contact your local health department or call the epidemiologist on call at [phone] (available 24/7). ? This guidance is subject to change. For the most up-to-date guidance from [facility], please refer to their website: [email or web address]

## 2020-07-10 LAB — NOVEL CORONAVIRUS, NAA: SARS-CoV-2, NAA: DETECTED — AB

## 2020-07-10 LAB — SARS-COV-2, NAA 2 DAY TAT

## 2020-11-08 ENCOUNTER — Encounter: Payer: Self-pay | Admitting: Family Medicine

## 2020-11-08 ENCOUNTER — Telehealth: Payer: Self-pay

## 2020-11-08 ENCOUNTER — Ambulatory Visit (INDEPENDENT_AMBULATORY_CARE_PROVIDER_SITE_OTHER): Payer: Medicare HMO | Admitting: Family Medicine

## 2020-11-08 ENCOUNTER — Other Ambulatory Visit: Payer: Self-pay

## 2020-11-08 VITALS — BP 122/77 | HR 77 | Temp 98.8°F | Wt 133.8 lb

## 2020-11-08 DIAGNOSIS — E7800 Pure hypercholesterolemia, unspecified: Secondary | ICD-10-CM

## 2020-11-08 DIAGNOSIS — R69 Illness, unspecified: Secondary | ICD-10-CM | POA: Diagnosis not present

## 2020-11-08 DIAGNOSIS — F3342 Major depressive disorder, recurrent, in full remission: Secondary | ICD-10-CM | POA: Diagnosis not present

## 2020-11-08 DIAGNOSIS — Z1231 Encounter for screening mammogram for malignant neoplasm of breast: Secondary | ICD-10-CM | POA: Diagnosis not present

## 2020-11-08 DIAGNOSIS — F5101 Primary insomnia: Secondary | ICD-10-CM | POA: Diagnosis not present

## 2020-11-08 MED ORDER — GABAPENTIN 300 MG PO CAPS
600.0000 mg | ORAL_CAPSULE | Freq: Two times a day (BID) | ORAL | 3 refills | Status: DC
Start: 2020-11-08 — End: 2021-11-14

## 2020-11-08 MED ORDER — CITALOPRAM HYDROBROMIDE 40 MG PO TABS
40.0000 mg | ORAL_TABLET | Freq: Every day | ORAL | 1 refills | Status: DC
Start: 2020-11-08 — End: 2021-05-16

## 2020-11-08 MED ORDER — ZOLPIDEM TARTRATE 10 MG PO TABS
5.0000 mg | ORAL_TABLET | Freq: Every evening | ORAL | 2 refills | Status: DC | PRN
Start: 2020-11-08 — End: 2020-11-09

## 2020-11-08 MED ORDER — ATORVASTATIN CALCIUM 40 MG PO TABS: 40.0000 mg | ORAL_TABLET | Freq: Every day | ORAL | 1 refills | Status: DC

## 2020-11-08 NOTE — Assessment & Plan Note (Signed)
Under good control on current regimen. Continue current regimen. Continue to monitor. Call with any concerns. Refills given.   

## 2020-11-08 NOTE — Assessment & Plan Note (Signed)
Under good control on current regimen. Continue current regimen. Continue to monitor. Call with any concerns. Refills given. Labs drawn today.   

## 2020-11-08 NOTE — Progress Notes (Signed)
BP 122/77   Pulse 77   Temp 98.8 F (37.1 C)   Wt 133 lb 12.8 oz (60.7 kg)   SpO2 98%   BMI 23.89 kg/m    Subjective:    Patient ID: Kathryn Conway, female    DOB: 06/26/1951, 70 y.o.   MRN: [id number]  HPI: Kathryn Conway is a 70 y.o. female  Chief Complaint  Patient presents with  . Insomnia  . Depression  . Hyperlipidemia   DEPRESSION Mood status: controlled Satisfied with current treatment?: yes Symptom severity: mild  Duration of current treatment : chronic Side effects: no Medication compliance: excellent compliance Psychotherapy/counseling: no  Previous psychiatric medications: celexa Depressed mood: no Anxious mood: no Anhedonia: no Significant weight loss or gain: no Insomnia: yes  Fatigue: yes Feelings of worthlessness or guilt: no Impaired concentration/indecisiveness: no Suicidal ideations: no Hopelessness: no Crying spells: no Depression screen [facility] 2/9 11/08/2020 05/10/2020 05/10/2020 11/11/2019 05/09/2019  Decreased Interest 0 0 0 0 0  Down, Depressed, Hopeless 0 0 0 0 0  PHQ - 2 Score 0 0 0 0 0  Altered sleeping 0 0 3 0 1  Tired, decreased energy 0 0 1 0 1  Change in appetite 0 0 0 0 0  Feeling bad or failure about yourself  0 0 0 0 0  Trouble concentrating 0 0 0 0 0  Moving slowly or fidgety/restless 0 0 0 0 0  Suicidal thoughts 0 0 0 0 0  PHQ-9 Score 0 0 4 0 2  Difficult doing work/chores - - Not difficult at all - Not difficult at all  Some recent data might be hidden   INSOMNIA Duration: chronic Satisfied with sleep quality: yes Difficulty falling asleep: yes Difficulty staying asleep: yes Waking a few hours after sleep onset: no Early morning awakenings: no Daytime hypersomnolence: no Wakes feeling refreshed: no Good sleep hygiene: no Apnea: no Snoring: no Depressed/anxious mood: no Recent stress: no Restless legs/nocturnal leg cramps: no Chronic pain/arthritis: no History of sleep study: no Treatments attempted:  melatonin, uinsom, benadryl and ambien   HYPERLIPIDEMIA Hyperlipidemia status: excellent compliance Satisfied with current treatment?  yes Side effects:  no Medication compliance: excellent compliance Past cholesterol meds: atorvastatin Supplements: none Aspirin:  yes The 10-year ASCVD risk score [name] DC Jr., et al., 2013) is: 5.1%   Values used to calculate the score:     Age: 69 years     Sex: Female     Is Non-Hispanic African American: No     Diabetic: No     Tobacco smoker: No     Systolic Blood Pressure: 536 mmHg     Is BP treated: No     HDL Cholesterol: 72 mg/dL     Total Cholesterol: 148 mg/dL Chest pain:  yes   Relevant past medical, surgical, family and social history reviewed and updated as indicated. Interim medical history since our last visit reviewed. Allergies and medications reviewed and updated.  Review of Systems  Constitutional: Negative.   Respiratory: Negative.   Cardiovascular: Negative.   Gastrointestinal: Negative.   Musculoskeletal: Negative.   Neurological: Negative.   Psychiatric/Behavioral: Negative.     Per HPI unless specifically indicated above     Objective:    BP 122/77   Pulse 77   Temp 98.8 F (37.1 C)   Wt 133 lb 12.8 oz (60.7 kg)   SpO2 98%   BMI 23.89 kg/m   Wt Readings from Last 3 Encounters:  11/08/20 133 lb  12.8 oz (60.7 kg)  05/10/20 133 lb 6.4 oz (60.5 kg)  05/10/20 133 lb (60.3 kg)    Physical Exam Vitals and nursing note reviewed.  Constitutional:      General: She is not in acute distress.    Appearance: Normal appearance. She is not ill-appearing, toxic-appearing or diaphoretic.  HENT:     Head: Normocephalic and atraumatic.     Right Ear: External ear normal.     Left Ear: External ear normal.     Nose: Nose normal.     Mouth/Throat:     Mouth: Mucous membranes are moist.     Pharynx: Oropharynx is clear.  Eyes:     General: No scleral icterus.       Right eye: No discharge.        Left eye: No  discharge.     Extraocular Movements: Extraocular movements intact.     Conjunctiva/sclera: Conjunctivae normal.     Pupils: Pupils are equal, round, and reactive to light.  Cardiovascular:     Rate and Rhythm: Normal rate and regular rhythm.     Pulses: Normal pulses.     Heart sounds: Normal heart sounds. No murmur heard. No friction rub. No gallop.   Pulmonary:     Effort: Pulmonary effort is normal. No respiratory distress.     Breath sounds: Normal breath sounds. No stridor. No wheezing, rhonchi or rales.  Chest:     Chest wall: No tenderness.  Musculoskeletal:        General: Normal range of motion.     Cervical back: Normal range of motion and neck supple.  Skin:    General: Skin is warm and dry.     Capillary Refill: Capillary refill takes less than 2 seconds.     Coloration: Skin is not jaundiced or pale.     Findings: No bruising, erythema, lesion or rash.  Neurological:     General: No focal deficit present.     Mental Status: She is alert and oriented to person, place, and time. Mental status is at baseline.  Psychiatric:        Mood and Affect: Mood normal.        Behavior: Behavior normal.        Thought Content: Thought content normal.        Judgment: Judgment normal.     Results for orders placed or performed in visit on 07/08/20  Novel Coronavirus, NAA ([facility])   Specimen: Nasopharyngeal(NP) swabs in vial transport medium  Result Value Ref Range   SARS-CoV-2, NAA Detected (A) Not Detected  SARS-COV-2, NAA 2 DAY TAT  Result Value Ref Range   SARS-CoV-2, NAA 2 DAY TAT Performed       Assessment & Plan:   Problem List Items Addressed This Visit      Other   Hypercholesterolemia    Under good control on current regimen. Continue current regimen. Continue to monitor. Call with any concerns. Refills given. Labs drawn today      Relevant Medications   atorvastatin (LIPITOR) 40 MG tablet   Other Relevant Orders   Comprehensive metabolic panel   Lipid  Panel w/o Chol/HDL Ratio   Insomnia    Under good control on current regimen. Continue current regimen. Continue to monitor. Call with any concerns. Refills given.       Recurrent major depressive disorder, in full remission ([place]) - Primary    Under good control on current regimen. Continue current regimen. Continue to monitor.  Call with any concerns. Refills given.        Relevant Medications   citalopram (CELEXA) 40 MG tablet    Other Visit Diagnoses    Encounter for screening mammogram for malignant neoplasm of breast       Mammogram ordered today   Relevant Orders   MM 3D SCREEN BREAST BILATERAL       Follow up plan: Return in about 6 months (around 05/11/2021) for physical.

## 2020-11-08 NOTE — Telephone Encounter (Signed)
PA for Ambien initiated and submitted via Cover My Meds. Key: [id number]

## 2020-11-08 NOTE — Patient Instructions (Signed)
Call to schedule your mammogram: [facility]  Address: [address], [place], [place] [address]  Phone: [phone]  

## 2020-11-09 LAB — COMPREHENSIVE METABOLIC PANEL
ALT: 17 IU/L (ref 0–32)
AST: 23 IU/L (ref 0–40)
Albumin/Globulin Ratio: 2.4 — ABNORMAL HIGH (ref 1.2–2.2)
Albumin: 4.5 g/dL (ref 3.8–4.8)
Alkaline Phosphatase: 65 IU/L (ref 44–121)
BUN/Creatinine Ratio: 28 (ref 12–28)
BUN: 16 mg/dL (ref 8–27)
Bilirubin Total: 0.2 mg/dL (ref 0.0–1.2)
CO2: 22 mmol/L (ref 20–29)
Calcium: 8.9 mg/dL (ref 8.7–10.3)
Chloride: 101 mmol/L (ref 96–106)
Creatinine, Ser: 0.58 mg/dL (ref 0.57–1.00)
Globulin, Total: 1.9 g/dL (ref 1.5–4.5)
Glucose: 88 mg/dL (ref 65–99)
Potassium: 3.7 mmol/L (ref 3.5–5.2)
Sodium: 137 mmol/L (ref 134–144)
Total Protein: 6.4 g/dL (ref 6.0–8.5)
eGFR: 99 mL/min/{1.73_m2} (ref >59)

## 2020-11-09 LAB — LIPID PANEL W/O CHOL/HDL RATIO
Cholesterol, Total: 147 mg/dL (ref 100–199)
HDL: 66 mg/dL (ref >39)
LDL Chol Calc (NIH): 66 mg/dL (ref 0–99)
Triglycerides: 79 mg/dL (ref 0–149)
VLDL Cholesterol Cal: 15 mg/dL (ref 5–40)

## 2020-11-09 MED ORDER — DOXEPIN HCL 6 MG PO TABS: 6.0000 mg | ORAL_TABLET | Freq: Every evening | ORAL | 5 refills | Status: DC | PRN

## 2020-11-09 NOTE — Telephone Encounter (Signed)
Patient notified. Please send in new medication.

## 2020-11-09 NOTE — Telephone Encounter (Signed)
PA denied, patient must try and fail Doxepin before Ambien will be covered.

## 2020-11-09 NOTE — Telephone Encounter (Signed)
Please let patient know- she's been on medicine for a very long time and may want to pay out of pocket. Otherwise I'll send through doxepin

## 2020-12-01 ENCOUNTER — Encounter: Payer: Self-pay | Admitting: Family Medicine

## 2020-12-07 ENCOUNTER — Encounter: Payer: Self-pay | Admitting: Family Medicine

## 2020-12-07 ENCOUNTER — Other Ambulatory Visit: Payer: Self-pay

## 2020-12-07 ENCOUNTER — Ambulatory Visit (INDEPENDENT_AMBULATORY_CARE_PROVIDER_SITE_OTHER): Payer: Medicare HMO | Admitting: Family Medicine

## 2020-12-07 DIAGNOSIS — F5101 Primary insomnia: Secondary | ICD-10-CM | POA: Diagnosis not present

## 2020-12-07 DIAGNOSIS — R69 Illness, unspecified: Secondary | ICD-10-CM | POA: Diagnosis not present

## 2020-12-07 MED ORDER — ZOLPIDEM TARTRATE 10 MG PO TABS: 5.0000 mg | ORAL_TABLET | Freq: Every evening | ORAL | 2 refills | Status: DC | PRN

## 2020-12-07 NOTE — Assessment & Plan Note (Signed)
Did not tolerate doxipin. Will put her back on her ambien. Call with any concerns. Recheck 6 months.

## 2020-12-07 NOTE — Progress Notes (Signed)
BP 130/73   Pulse 74   Temp 98.1 F (36.7 C)   Wt 133 lb (60.3 kg)   SpO2 98%   BMI 23.75 kg/m    Subjective:    Patient ID: Kathryn Conway, female    DOB: 11-01-1950, 70 y.o.   MRN: [id number]  HPI: Kathryn Conway is a 70 y.o. female  Chief Complaint  Patient presents with  . Insomnia    Patient has been having issues sleeping since she has changed medications.    INSOMNIA- had to try doxipin per insurance as they would not cover her ambien. She has not been sleeping well since then.  Duration: chronic Satisfied with sleep quality: no Difficulty falling asleep: yes Difficulty staying asleep: yes Waking a few hours after sleep onset: yes Early morning awakenings: yes Daytime hypersomnolence: no Wakes feeling refreshed: no Good sleep hygiene: yes Apnea: no Snoring: no Depressed/anxious mood: yes Recent stress: no Restless legs/nocturnal leg cramps: no Chronic pain/arthritis: no History of sleep study: no Treatments attempted: doxipin, melatonin, uinsom, benadryl and ambien    Relevant past medical, surgical, family and social history reviewed and updated as indicated. Interim medical history since our last visit reviewed. Allergies and medications reviewed and updated.  Review of Systems  Constitutional: Negative.   Respiratory: Negative.   Cardiovascular: Negative.   Gastrointestinal: Negative.   Musculoskeletal: Negative.   Psychiatric/Behavioral: Positive for sleep disturbance. Negative for agitation, behavioral problems, confusion, decreased concentration, dysphoric mood, hallucinations, self-injury and suicidal ideas. The patient is not nervous/anxious and is not hyperactive.     Per HPI unless specifically indicated above     Objective:    BP 130/73   Pulse 74   Temp 98.1 F (36.7 C)   Wt 133 lb (60.3 kg)   SpO2 98%   BMI 23.75 kg/m   Wt Readings from Last 3 Encounters:  12/07/20 133 lb (60.3 kg)  11/08/20 133 lb 12.8 oz (60.7 kg)   05/10/20 133 lb 6.4 oz (60.5 kg)    Physical Exam Vitals and nursing note reviewed.  Constitutional:      General: She is not in acute distress.    Appearance: Normal appearance. She is not ill-appearing, toxic-appearing or diaphoretic.  HENT:     Head: Normocephalic and atraumatic.     Right Ear: External ear normal.     Left Ear: External ear normal.     Nose: Nose normal.     Mouth/Throat:     Mouth: Mucous membranes are moist.     Pharynx: Oropharynx is clear.  Eyes:     General: No scleral icterus.       Right eye: No discharge.        Left eye: No discharge.     Extraocular Movements: Extraocular movements intact.     Conjunctiva/sclera: Conjunctivae normal.     Pupils: Pupils are equal, round, and reactive to light.  Cardiovascular:     Rate and Rhythm: Normal rate and regular rhythm.     Pulses: Normal pulses.     Heart sounds: Normal heart sounds. No murmur heard. No friction rub. No gallop.   Pulmonary:     Effort: Pulmonary effort is normal. No respiratory distress.     Breath sounds: Normal breath sounds. No stridor. No wheezing, rhonchi or rales.  Chest:     Chest wall: No tenderness.  Musculoskeletal:        General: Normal range of motion.     Cervical back: Normal range  of motion and neck supple.  Skin:    General: Skin is warm and dry.     Capillary Refill: Capillary refill takes less than 2 seconds.     Coloration: Skin is not jaundiced or pale.     Findings: No bruising, erythema, lesion or rash.  Neurological:     General: No focal deficit present.     Mental Status: She is alert and oriented to person, place, and time. Mental status is at baseline.  Psychiatric:        Mood and Affect: Mood normal.        Behavior: Behavior normal.        Thought Content: Thought content normal.        Judgment: Judgment normal.     Results for orders placed or performed in visit on 11/08/20  Comprehensive metabolic panel  Result Value Ref Range   Glucose  88 65 - 99 mg/dL   BUN 16 8 - 27 mg/dL   Creatinine, Ser 0.58 0.57 - 1.00 mg/dL   eGFR 99 >59 mL/min/1.73   BUN/Creatinine Ratio 28 12 - 28   Sodium 137 134 - 144 mmol/L   Potassium 3.7 3.5 - 5.2 mmol/L   Chloride 101 96 - 106 mmol/L   CO2 22 20 - 29 mmol/L   Calcium 8.9 8.7 - 10.3 mg/dL   Total Protein 6.4 6.0 - 8.5 g/dL   Albumin 4.5 3.8 - 4.8 g/dL   Globulin, Total 1.9 1.5 - 4.5 g/dL   Albumin/Globulin Ratio 2.4 (H) 1.2 - 2.2   Bilirubin Total 0.2 0.0 - 1.2 mg/dL   Alkaline Phosphatase 65 44 - 121 IU/L   AST 23 0 - 40 IU/L   ALT 17 0 - 32 IU/L  Lipid Panel w/o Chol/HDL Ratio  Result Value Ref Range   Cholesterol, Total 147 100 - 199 mg/dL   Triglycerides 79 0 - 149 mg/dL   HDL 66 >39 mg/dL   VLDL Cholesterol Cal 15 5 - 40 mg/dL   LDL Chol Calc (NIH) 66 0 - 99 mg/dL      Assessment & Plan:   Problem List Items Addressed This Visit      Other   Insomnia    Did not tolerate doxipin. Will put her back on her ambien. Call with any concerns. Recheck 6 months.           Follow up plan: Return in about 5 months (around 05/09/2021).

## 2020-12-16 ENCOUNTER — Ambulatory Visit: Payer: Medicare HMO | Admitting: Family Medicine

## 2020-12-28 DIAGNOSIS — H524 Presbyopia: Secondary | ICD-10-CM | POA: Diagnosis not present

## 2021-02-01 ENCOUNTER — Ambulatory Visit (INDEPENDENT_AMBULATORY_CARE_PROVIDER_SITE_OTHER): Payer: Medicare HMO | Admitting: Family Medicine

## 2021-02-01 ENCOUNTER — Other Ambulatory Visit: Payer: Self-pay

## 2021-02-01 ENCOUNTER — Encounter: Payer: Self-pay | Admitting: Family Medicine

## 2021-02-01 VITALS — BP 110/67 | HR 72 | Temp 98.4°F | Wt 134.4 lb

## 2021-02-01 DIAGNOSIS — G5602 Carpal tunnel syndrome, left upper limb: Secondary | ICD-10-CM | POA: Diagnosis not present

## 2021-02-01 MED ORDER — NORTRIPTYLINE HCL 25 MG PO CAPS: 25.0000 mg | ORAL_CAPSULE | Freq: Every day | ORAL | 0 refills | Status: DC

## 2021-02-01 NOTE — Patient Instructions (Signed)
[facility] [address] [place], [place] [address] Phone: [phone]

## 2021-02-01 NOTE — Progress Notes (Signed)
BP 110/67   Pulse 72   Temp 98.4 F (36.9 C) (Oral)   Wt 134 lb 6.4 oz (61 kg)   SpO2 97%   BMI 24.00 kg/m    Subjective:    Patient ID: Kathryn Conway, female    DOB: 1950/08/11, 70 y.o.   MRN: [id number]  HPI: Kathryn Conway is a 70 y.o. female  Chief Complaint  Patient presents with   Tingling    Pt states she has had some tingling in her L thumb for the past few months. States that within the last 2 weeks, the tingling has started to move up into her forearm   NUMBNESS Duration: on and off for about 6 months in her thumb, now about 2 weeks in her arm Onset: gradual Location: L thumb Bilateral: no Symmetric: no Decreased sensation: no  Weakness: yes Pain: yes Quality:  numb and tingling and aching Severity: moderate  Frequency: intermittent, but has been constant for the past 2 week Trauma: no Recent illness: no Diabetes: no Thyroid disease: no  HIV: no  Alcoholism: no  Spinal cord injury: no Status: worse Treatments attempted:   Relevant past medical, surgical, family and social history reviewed and updated as indicated. Interim medical history since our last visit reviewed. Allergies and medications reviewed and updated.  Review of Systems  Constitutional: Negative.   Respiratory: Negative.    Cardiovascular: Negative.   Gastrointestinal: Negative.   Musculoskeletal:  Positive for myalgias. Negative for arthralgias, back pain, gait problem, joint swelling, neck pain and neck stiffness.  Neurological:  Positive for weakness and numbness. Negative for dizziness, tremors, seizures, syncope, facial asymmetry, speech difficulty, light-headedness and headaches.  Psychiatric/Behavioral: Negative.     Per HPI unless specifically indicated above     Objective:    BP 110/67   Pulse 72   Temp 98.4 F (36.9 C) (Oral)   Wt 134 lb 6.4 oz (61 kg)   SpO2 97%   BMI 24.00 kg/m   Wt Readings from Last 3 Encounters:  02/01/21 134 lb 6.4 oz (61 kg)   12/07/20 133 lb (60.3 kg)  11/08/20 133 lb 12.8 oz (60.7 kg)    Physical Exam Vitals and nursing note reviewed.  Constitutional:      General: She is not in acute distress.    Appearance: Normal appearance. She is not ill-appearing, toxic-appearing or diaphoretic.  HENT:     Head: Normocephalic and atraumatic.     Right Ear: External ear normal.     Left Ear: External ear normal.     Nose: Nose normal.     Mouth/Throat:     Mouth: Mucous membranes are moist.     Pharynx: Oropharynx is clear.  Eyes:     General: No scleral icterus.       Right eye: No discharge.        Left eye: No discharge.     Extraocular Movements: Extraocular movements intact.     Conjunctiva/sclera: Conjunctivae normal.     Pupils: Pupils are equal, round, and reactive to light.  Cardiovascular:     Rate and Rhythm: Normal rate and regular rhythm.     Pulses: Normal pulses.     Heart sounds: Normal heart sounds. No murmur heard.   No friction rub. No gallop.  Pulmonary:     Effort: Pulmonary effort is normal. No respiratory distress.     Breath sounds: Normal breath sounds. No stridor. No wheezing, rhonchi or rales.  Chest:  Chest wall: No tenderness.  Musculoskeletal:        General: Normal range of motion.     Cervical back: Normal range of motion and neck supple.     Comments: + Tinels and phalens on the L  Skin:    General: Skin is warm and dry.     Capillary Refill: Capillary refill takes less than 2 seconds.     Coloration: Skin is not jaundiced or pale.     Findings: No bruising, erythema, lesion or rash.  Neurological:     General: No focal deficit present.     Mental Status: She is alert and oriented to person, place, and time. Mental status is at baseline.  Psychiatric:        Mood and Affect: Mood normal.        Behavior: Behavior normal.        Thought Content: Thought content normal.        Judgment: Judgment normal.    Results for orders placed or performed in visit on  11/08/20  Comprehensive metabolic panel  Result Value Ref Range   Glucose 88 65 - 99 mg/dL   BUN 16 8 - 27 mg/dL   Creatinine, Ser 0.58 0.57 - 1.00 mg/dL   eGFR 99 >59 mL/min/1.73   BUN/Creatinine Ratio 28 12 - 28   Sodium 137 134 - 144 mmol/L   Potassium 3.7 3.5 - 5.2 mmol/L   Chloride 101 96 - 106 mmol/L   CO2 22 20 - 29 mmol/L   Calcium 8.9 8.7 - 10.3 mg/dL   Total Protein 6.4 6.0 - 8.5 g/dL   Albumin 4.5 3.8 - 4.8 g/dL   Globulin, Total 1.9 1.5 - 4.5 g/dL   Albumin/Globulin Ratio 2.4 (H) 1.2 - 2.2   Bilirubin Total 0.2 0.0 - 1.2 mg/dL   Alkaline Phosphatase 65 44 - 121 IU/L   AST 23 0 - 40 IU/L   ALT 17 0 - 32 IU/L  Lipid Panel w/o Chol/HDL Ratio  Result Value Ref Range   Cholesterol, Total 147 100 - 199 mg/dL   Triglycerides 79 0 - 149 mg/dL   HDL 66 >39 mg/dL   VLDL Cholesterol Cal 15 5 - 40 mg/dL   LDL Chol Calc (NIH) 66 0 - 99 mg/dL      Assessment & Plan:   Problem List Items Addressed This Visit   None Visit Diagnoses     Carpal tunnel syndrome of left wrist    -  Primary   Will treat with nortriptyline until she gets in with hand specialist. Referral made today. Stretches given. Continue brace. Call with any concerns.    Relevant Medications   nortriptyline (PAMELOR) 25 MG capsule   Other Relevant Orders   Ambulatory referral to Hand Surgery        Follow up plan: Return if symptoms worsen or fail to improve.

## 2021-02-07 DIAGNOSIS — M5412 Radiculopathy, cervical region: Secondary | ICD-10-CM | POA: Diagnosis not present

## 2021-02-07 DIAGNOSIS — Z0100 Encounter for examination of eyes and vision without abnormal findings: Secondary | ICD-10-CM | POA: Diagnosis not present

## 2021-02-24 ENCOUNTER — Other Ambulatory Visit: Payer: Self-pay | Admitting: Family Medicine

## 2021-02-24 NOTE — Telephone Encounter (Signed)
Patient last seen 11/08/20 and has appointment in November.

## 2021-02-24 NOTE — Telephone Encounter (Signed)
   Notes to clinic:  REQUEST FOR 90 DAYS PRESCRIPTION   Requested Prescriptions  Pending Prescriptions Disp Refills   nortriptyline (PAMELOR) 25 MG capsule [Pharmacy Med Name: NORTRIPTYLINE HCL 25 MG CAP] 90 capsule 1    Sig: TAKE 1 CAPSULE BY MOUTH AT BEDTIME.     Psychiatry:  Antidepressants - Heterocyclics (TCAs) Passed - 02/24/2021 11:33 AM      Passed - Completed PHQ-2 or PHQ-9 in the last 360 days      Passed - Valid encounter within last 6 months    Recent Outpatient Visits           3 weeks ago Carpal tunnel syndrome of left wrist   [place], Megan P, DO   2 months ago Primary insomnia   [place] P, DO   3 months ago Recurrent major depressive disorder, in full remission [facility])   [place], Megan P, DO   7 months ago [place], DO   9 months ago Routine general medical examination at a health care facility   [place], Barb Merino, DO       Future Appointments             In 2 months  [facility], [place]   In 2 months Wynetta Emery, Barb Merino, [place], [place]

## 2021-04-20 ENCOUNTER — Ambulatory Visit
Admission: RE | Admit: 2021-04-20 | Discharge: 2021-04-20 | Disposition: A | Discharge status: Home / Self Care | Payer: Medicare HMO | Source: Ambulatory Visit | Attending: Family Medicine | Admitting: Family Medicine

## 2021-04-20 ENCOUNTER — Other Ambulatory Visit: Payer: Self-pay

## 2021-04-20 DIAGNOSIS — Z1231 Encounter for screening mammogram for malignant neoplasm of breast: Secondary | ICD-10-CM | POA: Diagnosis not present

## 2021-05-11 ENCOUNTER — Ambulatory Visit (INDEPENDENT_AMBULATORY_CARE_PROVIDER_SITE_OTHER): Payer: Medicare HMO | Admitting: Family Medicine

## 2021-05-11 ENCOUNTER — Ambulatory Visit: Payer: Medicare HMO

## 2021-05-11 DIAGNOSIS — Z0000 Encounter for general adult medical examination without abnormal findings: Secondary | ICD-10-CM

## 2021-05-11 NOTE — Patient Instructions (Signed)
Kathryn Conway , Thank you for taking time to come for your Medicare Wellness Visit. I appreciate your ongoing commitment to your health goals. Please review the following plan we discussed and let me know if I can assist you in the future.   Screening recommendations/referrals: Colonoscopy: up to date Mammogram: up to date Bone Density: Education provided Recommended yearly ophthalmology/optometry visit for glaucoma screening and checkup Recommended yearly dental visit for hygiene and checkup  Vaccinations: Influenza vaccine: Education provided Pneumococcal vaccine: Education provided Tdap vaccine: Education provided Shingles vaccine: up to date    Advanced directives: Education provided  Conditions/risks identified:   Next appointment: 05-16-2021 @ 3:00  [facility] 65 Years and Older, Female Preventive care refers to lifestyle choices and visits with your health care provider that can promote health and wellness. What does preventive care include? A yearly physical exam. This is also called an annual well check. Dental exams once or twice a year. Routine eye exams. Ask your health care provider how often you should have your eyes checked. Personal lifestyle choices, including: Daily care of your teeth and gums. Regular physical activity. Eating a healthy diet. Avoiding tobacco and drug use. Limiting alcohol use. Practicing safe sex. Taking low-dose aspirin every day. Taking vitamin and mineral supplements as recommended by your health care provider. What happens during an annual well check? The services and screenings done by your health care provider during your annual well check will depend on your age, overall health, lifestyle risk factors, and family history of disease. Counseling  Your health care provider may ask you questions about your: Alcohol use. Tobacco use. Drug use. Emotional well-being. Home and relationship well-being. Sexual  activity. Eating habits. History of falls. Memory and ability to understand (cognition). Work and work Statistician. Reproductive health. Screening  You may have the following tests or measurements: Height, weight, and BMI. Blood pressure. Lipid and cholesterol levels. These may be checked every 5 years, or more frequently if you are over 69 years old. Skin check. Lung cancer screening. You may have this screening every year starting at age 73 if you have a 30-pack-year history of smoking and currently smoke or have quit within the past 15 years. Fecal occult blood test (FOBT) of the stool. You may have this test every year starting at age 61. Flexible sigmoidoscopy or colonoscopy. You may have a sigmoidoscopy every 5 years or a colonoscopy every 10 years starting at age 48. Hepatitis C blood test. Hepatitis B blood test. Sexually transmitted disease (STD) testing. Diabetes screening. This is done by checking your blood sugar (glucose) after you have not eaten for a while (fasting). You may have this done every 1-3 years. Bone density scan. This is done to screen for osteoporosis. You may have this done starting at age 93. Mammogram. This may be done every 1-2 years. Talk to your health care provider about how often you should have regular mammograms. Talk with your health care provider about your test results, treatment options, and if necessary, the need for more tests. Vaccines  Your health care provider may recommend certain vaccines, such as: Influenza vaccine. This is recommended every year. Tetanus, diphtheria, and acellular pertussis (Tdap, Td) vaccine. You may need a Td booster every 10 years. Zoster vaccine. You may need this after age 39. Pneumococcal 13-valent conjugate (PCV13) vaccine. One dose is recommended after age 77. Pneumococcal polysaccharide (PPSV23) vaccine. One dose is recommended after age 10. Talk to your health care provider about which  screenings and vaccines  you need and how often you need them. This information is not intended to replace advice given to you by your health care provider. Make sure you discuss any questions you have with your health care provider. Document Released: 07/16/2015 Document Revised: 03/08/2016 Document Reviewed: 04/20/2015 Elsevier Interactive Patient Education  2017 [place] Prevention in the Home Falls can cause injuries. They can happen to people of all ages. There are many things you can do to make your home safe and to help prevent falls. What can I do on the outside of my home? Regularly fix the edges of walkways and driveways and fix any cracks. Remove anything that might make you trip as you walk through a door, such as a raised step or threshold. Trim any bushes or trees on the path to your home. Use bright outdoor lighting. Clear any walking paths of anything that might make someone trip, such as rocks or tools. Regularly check to see if handrails are loose or broken. Make sure that both sides of any steps have handrails. Any raised decks and porches should have guardrails on the edges. Have any leaves, snow, or ice cleared regularly. Use sand or salt on walking paths during winter. Clean up any spills in your garage right away. This includes oil or grease spills. What can I do in the bathroom? Use night lights. Install grab bars by the toilet and in the tub and shower. Do not use towel bars as grab bars. Use non-skid mats or decals in the tub or shower. If you need to sit down in the shower, use a plastic, non-slip stool. Keep the floor dry. Clean up any water that spills on the floor as soon as it happens. Remove soap buildup in the tub or shower regularly. Attach bath mats securely with double-sided non-slip rug tape. Do not have throw rugs and other things on the floor that can make you trip. What can I do in the bedroom? Use night lights. Make sure that you have a light by your bed that  is easy to reach. Do not use any sheets or blankets that are too big for your bed. They should not hang down onto the floor. Have a firm chair that has side arms. You can use this for support while you get dressed. Do not have throw rugs and other things on the floor that can make you trip. What can I do in the kitchen? Clean up any spills right away. Avoid walking on wet floors. Keep items that you use a lot in easy-to-reach places. If you need to reach something above you, use a strong step stool that has a grab bar. Keep electrical cords out of the way. Do not use floor polish or wax that makes floors slippery. If you must use wax, use non-skid floor wax. Do not have throw rugs and other things on the floor that can make you trip. What can I do with my stairs? Do not leave any items on the stairs. Make sure that there are handrails on both sides of the stairs and use them. Fix handrails that are broken or loose. Make sure that handrails are as long as the stairways. Check any carpeting to make sure that it is firmly attached to the stairs. Fix any carpet that is loose or worn. Avoid having throw rugs at the top or bottom of the stairs. If you do have throw rugs, attach them to the floor with carpet  tape. Make sure that you have a light switch at the top of the stairs and the bottom of the stairs. If you do not have them, ask someone to add them for you. What else can I do to help prevent falls? Wear shoes that: Do not have high heels. Have rubber bottoms. Are comfortable and fit you well. Are closed at the toe. Do not wear sandals. If you use a stepladder: Make sure that it is fully opened. Do not climb a closed stepladder. Make sure that both sides of the stepladder are locked into place. Ask someone to hold it for you, if possible. Clearly mark and make sure that you can see: Any grab bars or handrails. First and last steps. Where the edge of each step is. Use tools that help you  move around (mobility aids) if they are needed. These include: Canes. Walkers. Scooters. Crutches. Turn on the lights when you go into a dark area. Replace any light bulbs as soon as they burn out. Set up your furniture so you have a clear path. Avoid moving your furniture around. If any of your floors are uneven, fix them. If there are any pets around you, be aware of where they are. Review your medicines with your doctor. Some medicines can make you feel dizzy. This can increase your chance of falling. Ask your doctor what other things that you can do to help prevent falls. This information is not intended to replace advice given to you by your health care provider. Make sure you discuss any questions you have with your health care provider. Document Released: 04/15/2009 Document Revised: 11/25/2015 Document Reviewed: 07/24/2014 Elsevier Interactive Patient Education  2017 [employer].

## 2021-05-11 NOTE — Progress Notes (Signed)
Subjective:   Kathryn Conway is a 70 y.o. female who presents for Medicare Annual (Subsequent) preventive examination.  I connected with  Kathryn Conway on 05/11/21 by a telephone enabled telemedicine application and verified that I am speaking with the correct person using two identifiers.   I discussed the limitations of evaluation and management by telemedicine. The patient expressed understanding and agreed to proceed.    Review of Systems     Cardiac Risk Factors include: advanced age (>30men, >71 women);hypertension     Objective:    Today's Vitals   There is no height or weight on file to calculate BMI.  Advanced Directives 05/11/2021 05/10/2020 04/26/2018  Does Patient Have a Medical Advance Directive? No No No  Would patient like information on creating a medical advance directive? No - Patient declined - No - Patient declined    Current Medications (verified) Outpatient Encounter Medications as of 05/11/2021  Medication Sig   alendronate (FOSAMAX) 70 MG tablet Take 1 tablet (70 mg total) by mouth every 7 (seven) days. Take with a full glass of water on an empty stomach.   aspirin EC 81 MG tablet Take 81 mg by mouth daily.   atorvastatin (LIPITOR) 40 MG tablet Take 1 tablet (40 mg total) by mouth daily.   citalopram (CELEXA) 40 MG tablet Take 1 tablet (40 mg total) by mouth daily.   gabapentin (NEURONTIN) 300 MG capsule Take 2 capsules (600 mg total) by mouth 2 (two) times daily.   loratadine (CLARITIN) 10 MG tablet Take 10 mg by mouth daily.   MEGARED OMEGA-3 KRILL OIL PO Take by mouth daily.   zolpidem (AMBIEN) 10 MG tablet Take 0.5 tablets (5 mg total) by mouth at bedtime as needed.   nortriptyline (PAMELOR) 25 MG capsule TAKE 1 CAPSULE BY MOUTH AT BEDTIME. (Patient not taking: Reported on 05/11/2021)   No facility-administered encounter medications on file as of 05/11/2021.    Allergies (verified) Sulfa antibiotics   History: Past Medical History:   Diagnosis Date   Depression    Fibromyalgia    Hyperlipidemia    Insomnia    PONV (postoperative nausea and vomiting)    TMJ (temporomandibular joint disorder)    Uterine fibroid    Wears contact lenses    Wears dentures    full upper, partial lower   Past Surgical History:  Procedure Laterality Date   ABDOMINAL HYSTERECTOMY     COLONOSCOPY  05/20/2012   repeat 5 years   COLONOSCOPY WITH PROPOFOL N/A 04/26/2018   Procedure: COLONOSCOPY WITH PROPOFOL;  Surgeon: Lucilla Lame, MD;  Location: [facility];  Service: Endoscopy;  Laterality: N/A;   laparoscopic gyn procedure     done before her hysterectomy   OOPHORECTOMY Left    Family History  Problem Relation Age of Onset   Heart disease Mother    Hypertension Mother    Depression Mother    Osteoporosis Mother    Colon polyps Mother    Cirrhosis Mother        non alcoholic   Liver disease Mother        fatty liver disease   Heart disease Father    Hypertension Father    Colon polyps Father    Stroke Father    Heart disease Sister    Hypertension Sister    Colon polyps Sister    Diabetes Sister    Hypertension Daughter    Colon polyps Daughter    Heart attack Brother  Hypertension Brother    Heart attack Brother    Hyperlipidemia Son    Breast cancer Neg Hx    Social History   Socioeconomic History   Marital status: Married    Spouse name: Not on file   Number of children: Not on file   Years of education: Not on file   Highest education level: Not on file  Occupational History   Occupation: part time   Tobacco Use   Smoking status: Never   Smokeless tobacco: Never  Vaping Use   Vaping Use: Never used  Substance and Sexual Activity   Alcohol use: Yes    Alcohol/week: 3.0 standard drinks    Types: 3 Glasses of wine per week   Drug use: No   Sexual activity: Yes    Birth control/protection: Surgical    Comment: Hysterectomy  Other Topics Concern   Not on file  Social History Narrative    Not on file   Social Determinants of Health   Financial Resource Strain: Low Risk    Difficulty of Paying Living Expenses: Not hard at all  Food Insecurity: No Food Insecurity   Worried About Charity fundraiser in the Last Year: Never true   Ran Out of Food in the Last Year: Never true  Transportation Needs: No Transportation Needs   Lack of Transportation (Medical): No   Lack of Transportation (Non-Medical): No  Physical Activity: Inactive   Days of Exercise per Week: 0 days   Minutes of Exercise per Session: 0 min  Stress: No Stress Concern Present   Feeling of Stress : Not at all  Social Connections: Moderately Integrated   Frequency of Communication with Friends and Family: More than three times a week   Frequency of Social Gatherings with Friends and Family: More than three times a week   Attends Religious Services: More than 4 times per year   Active Member of [employer] or Organizations: No   Attends Music therapist: Never   Marital Status: Married    Tobacco Counseling Counseling given: Not Answered   Clinical Intake:  Pre-visit preparation completed: Yes  Pain : No/denies pain     Nutritional Risks: None Diabetes: No  How often do you need to have someone help you when you read instructions, pamphlets, or other written materials from your doctor or pharmacy?: 1 - Never  Diabetic? no  Interpreter Needed?: No  Information entered by :: Leroy Kennedy LPN   Activities of Daily Living In your present state of health, do you have any difficulty performing the following activities: 05/11/2021  Hearing? N  Vision? N  Difficulty concentrating or making decisions? N  Walking or climbing stairs? N  Dressing or bathing? N  Doing errands, shopping? N  Preparing Food and eating ? N  Using the Toilet? N  In the past six months, have you accidently leaked urine? N  Do you have problems with loss of bowel control? N  Managing your Medications? N  Managing  your Finances? N  Housekeeping or managing your Housekeeping? N  Some recent data might be hidden    Patient Care Team: Valerie Roys, DO as PCP - General (Family Medicine)  Indicate any recent Medical Services you may have received from other than [facility] providers in the past year (date may be approximate).     Assessment:   This is a routine wellness examination for [place].  Hearing/Vision screen Hearing Screening - Comments:: No trouble hearing Vision Screening -  Comments:: Up to date [facility]  Dietary issues and exercise activities discussed: Current Exercise Habits: The patient does not participate in regular exercise at present, Exercise limited by: None identified   Goals Addressed             This Visit's Progress    Patient Stated   On track    05/10/2020, wants to get back to exercising     Patient Stated       Maintain current lifestyle       Depression Screen PHQ 2/9 Scores 05/11/2021 11/08/2020 05/10/2020 05/10/2020 11/11/2019 05/09/2019 05/01/2019  PHQ - 2 Score 0 0 0 0 0 0 0  PHQ- 9 Score - 0 0 4 0 2 -    Fall Risk Fall Risk  05/11/2021 05/10/2020 05/09/2019 05/01/2019 04/02/2018  Falls in the past year? 0 0 0 0 No  Number falls in past yr: 0 - 0 0 -  Injury with Fall? 0 - 0 0 -  Risk for fall due to : - Medication side effect - - -  Follow up Falls evaluation completed;Education provided;Falls prevention discussed Falls evaluation completed;Education provided;Falls prevention discussed Falls evaluation completed - -    FALL RISK PREVENTION PERTAINING TO THE HOME:  Any stairs in or around the home? No  If so, are there any without handrails? No  Home free of loose throw rugs in walkways, pet beds, electrical cords, etc? Yes  Adequate lighting in your home to reduce risk of falls? Yes   ASSISTIVE DEVICES UTILIZED TO PREVENT FALLS:  Life alert? No  Use of a cane, walker or w/c? No  Grab bars in the bathroom? Yes  Shower chair or bench in shower?  No  Elevated toilet seat or a handicapped toilet? No   TIMED UP AND GO:  Was the test performed? No .   Cognitive Function:  Normal cognitive status assessed by direct observation by this Nurse Health Advisor. No abnormalities found.       6CIT Screen 05/10/2020 05/01/2019 04/02/2018  What Year? 0 points 0 points 0 points  What month? 0 points 0 points 0 points  What time? 0 points 0 points 0 points  Count back from 20 0 points 0 points 0 points  Months in reverse 0 points 0 points 0 points  Repeat phrase 2 points 0 points 0 points  Total Score 2 0 0    Immunizations Immunization History  Administered Date(s) Administered   Influenza-Unspecified 04/26/2016   Zoster Recombinat (Shingrix) 04/19/2018, 08/24/2018   Zoster, Live 05/11/2014    TDAP status: Due, Education has been provided regarding the importance of this vaccine. Advised may receive this vaccine at local pharmacy or Health Dept. Aware to provide a copy of the vaccination record if obtained from local pharmacy or Health Dept. Verbalized acceptance and understanding.  Flu Vaccine status: Declined, Education has been provided regarding the importance of this vaccine but patient still declined. Advised may receive this vaccine at local pharmacy or Health Dept. Aware to provide a copy of the vaccination record if obtained from local pharmacy or Health Dept. Verbalized acceptance and understanding.  Pneumococcal vaccine status: Due, Education has been provided regarding the importance of this vaccine. Advised may receive this vaccine at local pharmacy or Health Dept. Aware to provide a copy of the vaccination record if obtained from local pharmacy or Health Dept. Verbalized acceptance and understanding.  Covid-19 vaccine status: Declined, Education has been provided regarding the importance of this vaccine but patient  still declined. Advised may receive this vaccine at local pharmacy or Health Dept.or vaccine clinic. Aware to  provide a copy of the vaccination record if obtained from local pharmacy or Health Dept. Verbalized acceptance and understanding.  Qualifies for Shingles Vaccine? No   Zostavax completed Yes   Shingrix Completed?: Yes  Screening Tests Health Maintenance  Topic Date Due   TETANUS/TDAP  Never done   Pneumonia Vaccine 39+ Years old (1 - PCV) Never done   COVID-19 Vaccine (1) 05/27/2021 (Originally 08/20/1951)   INFLUENZA VACCINE  09/30/2021 (Originally 01/31/2021)   MAMMOGRAM  04/21/2023   COLONOSCOPY (Pts 45-32yrs Insurance coverage will need to be confirmed)  04/26/2028   DEXA SCAN  Completed   Hepatitis C Screening  Completed   Zoster Vaccines- Shingrix  Completed   HPV VACCINES  Aged Out    Health Maintenance  Health Maintenance Due  Topic Date Due   TETANUS/TDAP  Never done   Pneumonia Vaccine 19+ Years old (1 - PCV) Never done    Colorectal cancer screening: Type of screening: Colonoscopy. Completed 2019. Repeat every 10 years  Mammogram status: Completed 2022. Repeat every year  Bone Density status: Ordered  . Pt provided with contact info and advised to call to schedule appt.  Lung Cancer Screening: (Low Dose CT Chest recommended if Age 66-80 years, 30 pack-year currently smoking OR have quit w/in 15years.)  qualify.   Lung Cancer Screening Referral:   Additional Screening:  Hepatitis C Screening: does not qualify; Completed   Vision Screening: Recommended annual ophthalmology exams for early detection of glaucoma and other disorders of the eye. Is the patient up to date with their annual eye exam?  Yes  Who is the provider or what is the name of the office in which the patient attends annual eye exams? Dr. [facility] If pt is not established with a provider, would they like to be referred to a provider to establish care? No .   Dental Screening: Recommended annual dental exams for proper oral hygiene  Community Resource Referral / Chronic Care Management: CRR  required this visit?  No   CCM required this visit?  No      Plan:     I have personally reviewed and noted the following in the patient's chart:   Medical and social history Use of alcohol, tobacco or illicit drugs  Current medications and supplements including opioid prescriptions.  Functional ability and status Nutritional status Physical activity Advanced directives List of other physicians Hospitalizations, surgeries, and ER visits in previous 12 months Vitals Screenings to include cognitive, depression, and falls Referrals and appointments  In addition, I have reviewed and discussed with patient certain preventive protocols, quality metrics, and best practice recommendations. A written personalized care plan for preventive services as well as general preventive health recommendations were provided to patient.     Leroy Kennedy, LPN   [id number]   Nurse Notes:

## 2021-05-13 ENCOUNTER — Ambulatory Visit: Payer: Medicare HMO

## 2021-05-16 ENCOUNTER — Encounter: Payer: Self-pay | Admitting: Family Medicine

## 2021-05-16 ENCOUNTER — Other Ambulatory Visit: Payer: Self-pay

## 2021-05-16 ENCOUNTER — Ambulatory Visit (INDEPENDENT_AMBULATORY_CARE_PROVIDER_SITE_OTHER): Payer: Medicare HMO | Admitting: Family Medicine

## 2021-05-16 VITALS — BP 132/81 | HR 73 | Temp 98.1°F | Ht 63.0 in | Wt 138.2 lb

## 2021-05-16 DIAGNOSIS — Z0000 Encounter for general adult medical examination without abnormal findings: Secondary | ICD-10-CM

## 2021-05-16 DIAGNOSIS — E7800 Pure hypercholesterolemia, unspecified: Secondary | ICD-10-CM | POA: Diagnosis not present

## 2021-05-16 DIAGNOSIS — F5101 Primary insomnia: Secondary | ICD-10-CM

## 2021-05-16 DIAGNOSIS — Z23 Encounter for immunization: Secondary | ICD-10-CM

## 2021-05-16 DIAGNOSIS — F3342 Major depressive disorder, recurrent, in full remission: Secondary | ICD-10-CM

## 2021-05-16 DIAGNOSIS — M797 Fibromyalgia: Secondary | ICD-10-CM | POA: Diagnosis not present

## 2021-05-16 DIAGNOSIS — S51802A Unspecified open wound of left forearm, initial encounter: Secondary | ICD-10-CM | POA: Diagnosis not present

## 2021-05-16 DIAGNOSIS — Z1283 Encounter for screening for malignant neoplasm of skin: Secondary | ICD-10-CM

## 2021-05-16 DIAGNOSIS — R69 Illness, unspecified: Secondary | ICD-10-CM | POA: Diagnosis not present

## 2021-05-16 LAB — URINALYSIS, ROUTINE W REFLEX MICROSCOPIC
Bilirubin, UA: NEGATIVE
Glucose, UA: NEGATIVE
Ketones, UA: NEGATIVE
Leukocytes,UA: NEGATIVE
Nitrite, UA: NEGATIVE
Protein,UA: NEGATIVE
Specific Gravity, UA: >1.03 — ABNORMAL HIGH (ref 1.005–1.030)
Urobilinogen, Ur: 1 mg/dL (ref 0.2–1.0)
pH, UA: 5.5 (ref 5.0–7.5)

## 2021-05-16 LAB — MICROSCOPIC EXAMINATION
Bacteria, UA: NONE SEEN
Epithelial Cells (non renal): NONE SEEN /hpf (ref 0–10)
WBC, UA: NONE SEEN /hpf (ref 0–5)

## 2021-05-16 MED ORDER — ATORVASTATIN CALCIUM 40 MG PO TABS: 40.0000 mg | ORAL_TABLET | Freq: Every day | ORAL | 1 refills | Status: DC

## 2021-05-16 MED ORDER — ALENDRONATE SODIUM 70 MG PO TABS: 70.0000 mg | ORAL_TABLET | ORAL | 4 refills | Status: DC

## 2021-05-16 MED ORDER — CITALOPRAM HYDROBROMIDE 40 MG PO TABS: 40.0000 mg | ORAL_TABLET | Freq: Every day | ORAL | 1 refills | Status: DC

## 2021-05-16 MED ORDER — ZOLPIDEM TARTRATE 10 MG PO TABS: 5.0000 mg | ORAL_TABLET | Freq: Every evening | ORAL | 2 refills | Status: DC | PRN

## 2021-05-16 NOTE — Assessment & Plan Note (Signed)
Under good control on current regimen. Continue current regimen. Continue to monitor. Call with any concerns. Refills given.   

## 2021-05-16 NOTE — Assessment & Plan Note (Signed)
Under good control on current regimen. Continue current regimen. Continue to monitor. Call with any concerns. Refills given for 6 months. Follow up 6 months. 

## 2021-05-16 NOTE — Assessment & Plan Note (Signed)
Under good control on current regimen. Continue current regimen. Continue to monitor. Call with any concerns. Refills given. Labs drawn today.   

## 2021-05-16 NOTE — Progress Notes (Signed)
BP 132/81   Pulse 73   Temp 98.1 F (36.7 C)   Ht _0  (1.6 m)   Wt 138 lb 3.2 oz (62.7 kg)   SpO2 97%   BMI 24.48 kg/m    Subjective:    Patient ID: Kathryn Conway, female    DOB: 01/19/1951, 70 y.o.   MRN: [id number]  HPI: Kathryn Conway is a 70 y.o. female presenting on 05/16/2021 for comprehensive medical examination. Current medical complaints include:  HYPERLIPIDEMIA Hyperlipidemia status: excellent compliance Satisfied with current treatment?  yes Side effects:  no Medication compliance: excellent compliance Past cholesterol meds: atorvastatin Supplements: fish oil Aspirin:  yes The 10-year ASCVD risk score (Arnett DK, et al., 2019) is: 6.9%   Values used to calculate the score:     Age: 64 years     Sex: Female     Is Non-Hispanic African American: No     Diabetic: No     Tobacco smoker: No     Systolic Blood Pressure: 209 mmHg     Is BP treated: No     HDL Cholesterol: 66 mg/dL     Total Cholesterol: 147 mg/dL Chest pain:  no Coronary artery disease:  no  DEPRESSION Mood status: controlled Satisfied with current treatment?: yes Symptom severity: mild  Duration of current treatment : chronic Side effects: no Medication compliance: excellent compliance Psychotherapy/counseling: no  Previous psychiatric medications: celexa Depressed mood: no Anxious mood: no Anhedonia: no Significant weight loss or gain: no Insomnia: no- controlled  Fatigue: no Feelings of worthlessness or guilt: no Impaired concentration/indecisiveness: no Suicidal ideations: no Hopelessness: no Crying spells: no Depression screen [facility] 2/9 05/16/2021 05/11/2021 11/08/2020 05/10/2020 05/10/2020  Decreased Interest 0 0 0 0 0  Down, Depressed, Hopeless 0 0 0 0 0  PHQ - 2 Score 0 0 0 0 0  Altered sleeping 0 - 0 0 3  Tired, decreased energy 0 - 0 0 1  Change in appetite 0 - 0 0 0  Feeling bad or failure about yourself  0 - 0 0 0  Trouble concentrating 0 - 0 0 0  Moving slowly  or fidgety/restless 0 - 0 0 0  Suicidal thoughts 0 - 0 0 0  PHQ-9 Score 0 - 0 0 4  Difficult doing work/chores Not difficult at all - - - Not difficult at all  Some recent data might be hidden   INSOMNIA Duration: chronic Satisfied with sleep quality: yes Difficulty falling asleep: no Difficulty staying asleep: no Waking a few hours after sleep onset: no Early morning awakenings: no Daytime hypersomnolence: no Wakes feeling refreshed: no Good sleep hygiene: no Apnea: no Snoring: no Depressed/anxious mood: no Recent stress: no Restless legs/nocturnal leg cramps: no Chronic pain/arthritis: no History of sleep study: no Treatments attempted: melatonin, uinsom, benadryl, and ambien   Menopausal Symptoms: no  Depression Screen done today and results listed below:  Depression screen [facility] 2/9 05/16/2021 05/11/2021 11/08/2020 05/10/2020 05/10/2020  Decreased Interest 0 0 0 0 0  Down, Depressed, Hopeless 0 0 0 0 0  PHQ - 2 Score 0 0 0 0 0  Altered sleeping 0 - 0 0 3  Tired, decreased energy 0 - 0 0 1  Change in appetite 0 - 0 0 0  Feeling bad or failure about yourself  0 - 0 0 0  Trouble concentrating 0 - 0 0 0  Moving slowly or fidgety/restless 0 - 0 0 0  Suicidal thoughts 0 - 0 0  0  PHQ-9 Score 0 - 0 0 4  Difficult doing work/chores Not difficult at all - - - Not difficult at all  Some recent data might be hidden    Past Medical History:  Past Medical History:  Diagnosis Date   Depression    Fibromyalgia    Hyperlipidemia    Insomnia    PONV (postoperative nausea and vomiting)    TMJ (temporomandibular joint disorder)    Uterine fibroid    Wears contact lenses    Wears dentures    full upper, partial lower    Surgical History:  Past Surgical History:  Procedure Laterality Date   COLONOSCOPY  05/20/2012   repeat 5 years   COLONOSCOPY WITH PROPOFOL N/A 04/26/2018   Procedure: COLONOSCOPY WITH PROPOFOL;  Surgeon: Lucilla Lame, MD;  Location: [facility];   Service: Endoscopy;  Laterality: N/A;   laparoscopic gyn procedure     done before her hysterectomy   OOPHORECTOMY Left    TOTAL ABDOMINAL HYSTERECTOMY      Medications:  Current Outpatient Medications on File Prior to Visit  Medication Sig   gabapentin (NEURONTIN) 300 MG capsule Take 2 capsules (600 mg total) by mouth 2 (two) times daily.   loratadine (CLARITIN) 10 MG tablet Take 10 mg by mouth daily.   MEGARED OMEGA-3 KRILL OIL PO Take by mouth daily.   aspirin EC 81 MG tablet Take 81 mg by mouth daily. (Patient not taking: Reported on 05/16/2021)   meloxicam (MOBIC) 15 MG tablet Take 15 mg by mouth 3 (three) times daily.   No current facility-administered medications on file prior to visit.    Allergies:  Allergies  Allergen Reactions   Sulfa Antibiotics Rash    Social History:  Social History   Socioeconomic History   Marital status: Married    Spouse name: Not on file   Number of children: Not on file   Years of education: Not on file   Highest education level: Not on file  Occupational History   Occupation: part time   Tobacco Use   Smoking status: Never   Smokeless tobacco: Never  Vaping Use   Vaping Use: Never used  Substance and Sexual Activity   Alcohol use: Yes    Alcohol/week: 3.0 standard drinks    Types: 3 Glasses of wine per week   Drug use: No   Sexual activity: Yes    Birth control/protection: Surgical    Comment: Hysterectomy  Other Topics Concern   Not on file  Social History Narrative   Not on file   Social Determinants of Health   Financial Resource Strain: Low Risk    Difficulty of Paying Living Expenses: Not hard at all  Food Insecurity: No Food Insecurity   Worried About Charity fundraiser in the Last Year: Never true   Ran Out of Food in the Last Year: Never true  Transportation Needs: No Transportation Needs   Lack of Transportation (Medical): No   Lack of Transportation (Non-Medical): No  Physical Activity: Inactive   Days  of Exercise per Week: 0 days   Minutes of Exercise per Session: 0 min  Stress: No Stress Concern Present   Feeling of Stress : Not at all  Social Connections: Moderately Integrated   Frequency of Communication with Friends and Family: More than three times a week   Frequency of Social Gatherings with Friends and Family: More than three times a week   Attends Religious Services: More than 4 times  per year   Active Member of Clubs or Organizations: No   Attends Archivist Meetings: Never   Marital Status: Married  Human resources officer Violence: Not At Risk   Fear of Current or Ex-Partner: No   Emotionally Abused: No   Physically Abused: No   Sexually Abused: No   Social History   Tobacco Use  Smoking Status Never  Smokeless Tobacco Never   Social History   Substance and Sexual Activity  Alcohol Use Yes   Alcohol/week: 3.0 standard drinks   Types: 3 Glasses of wine per week    Family History:  Family History  Problem Relation Age of Onset   Heart disease Mother    Hypertension Mother    Depression Mother    Osteoporosis Mother    Colon polyps Mother    Cirrhosis Mother        non alcoholic   Liver disease Mother        fatty liver disease   Heart disease Father    Hypertension Father    Colon polyps Father    Stroke Father    Heart disease Sister    Hypertension Sister    Colon polyps Sister    Diabetes Sister    Hypertension Daughter    Colon polyps Daughter    Heart attack Brother    Hypertension Brother    Heart attack Brother    Hyperlipidemia Son    Breast cancer Neg Hx     Past medical history, surgical history, medications, allergies, family history and social history reviewed with patient today and changes made to appropriate areas of the chart.   Review of Systems  Constitutional: Negative.   HENT: Negative.    Eyes: Negative.   Respiratory: Negative.    Cardiovascular: Negative.   Gastrointestinal: Negative.   Genitourinary: Negative.    Musculoskeletal: Negative.   Skin: Negative.   Neurological: Negative.   Endo/Heme/Allergies: Negative.   Psychiatric/Behavioral: Negative.    All other ROS negative except what is listed above and in the HPI.      Objective:    BP 132/81   Pulse 73   Temp 98.1 F (36.7 C)   Ht _0  (1.6 m)   Wt 138 lb 3.2 oz (62.7 kg)   SpO2 97%   BMI 24.48 kg/m   Wt Readings from Last 3 Encounters:  05/16/21 138 lb 3.2 oz (62.7 kg)  02/01/21 134 lb 6.4 oz (61 kg)  12/07/20 133 lb (60.3 kg)    Physical Exam Vitals and nursing note reviewed.  Constitutional:      General: She is not in acute distress.    Appearance: Normal appearance. She is not ill-appearing, toxic-appearing or diaphoretic.  HENT:     Head: Normocephalic and atraumatic.     Right Ear: Tympanic membrane, ear canal and external ear normal. There is no impacted cerumen.     Left Ear: Tympanic membrane, ear canal and external ear normal. There is no impacted cerumen.     Nose: Nose normal. No congestion or rhinorrhea.     Mouth/Throat:     Mouth: Mucous membranes are moist.     Pharynx: Oropharynx is clear. No oropharyngeal exudate or posterior oropharyngeal erythema.  Eyes:     General: No scleral icterus.       Right eye: No discharge.        Left eye: No discharge.     Extraocular Movements: Extraocular movements intact.     Conjunctiva/sclera: Conjunctivae normal.  Pupils: Pupils are equal, round, and reactive to light.  Neck:     Vascular: No carotid bruit.  Cardiovascular:     Rate and Rhythm: Normal rate and regular rhythm.     Pulses: Normal pulses.     Heart sounds: No murmur heard.   No friction rub. No gallop.  Pulmonary:     Effort: Pulmonary effort is normal. No respiratory distress.     Breath sounds: Normal breath sounds. No stridor. No wheezing, rhonchi or rales.  Chest:     Chest wall: No tenderness.  Abdominal:     General: Abdomen is flat. Bowel sounds are normal. There is no distension.      Palpations: Abdomen is soft. There is no mass.     Tenderness: There is no abdominal tenderness. There is no right CVA tenderness, left CVA tenderness, guarding or rebound.     Hernia: No hernia is present.  Genitourinary:    Comments: Breast and pelvic exams deferred with shared decision making Musculoskeletal:        General: No swelling, tenderness, deformity or signs of injury.     Cervical back: Normal range of motion and neck supple. No rigidity. No muscular tenderness.     Right lower leg: No edema.     Left lower leg: No edema.  Lymphadenopathy:     Cervical: No cervical adenopathy.  Skin:    General: Skin is warm and dry.     Capillary Refill: Capillary refill takes less than 2 seconds.     Coloration: Skin is not jaundiced or pale.     Findings: No bruising, erythema, lesion or rash.     Comments: Small scratch on L forearm  Neurological:     General: No focal deficit present.     Mental Status: She is alert and oriented to person, place, and time. Mental status is at baseline.     Cranial Nerves: No cranial nerve deficit.     Sensory: No sensory deficit.     Motor: No weakness.     Coordination: Coordination normal.     Gait: Gait normal.     Deep Tendon Reflexes: Reflexes normal.  Psychiatric:        Mood and Affect: Mood normal.        Behavior: Behavior normal.        Thought Content: Thought content normal.        Judgment: Judgment normal.    Results for orders placed or performed in visit on 11/08/20  Comprehensive metabolic panel  Result Value Ref Range   Glucose 88 65 - 99 mg/dL   BUN 16 8 - 27 mg/dL   Creatinine, Ser 0.58 0.57 - 1.00 mg/dL   eGFR 99 >59 mL/min/1.73   BUN/Creatinine Ratio 28 12 - 28   Sodium 137 134 - 144 mmol/L   Potassium 3.7 3.5 - 5.2 mmol/L   Chloride 101 96 - 106 mmol/L   CO2 22 20 - 29 mmol/L   Calcium 8.9 8.7 - 10.3 mg/dL   Total Protein 6.4 6.0 - 8.5 g/dL   Albumin 4.5 3.8 - 4.8 g/dL   Globulin, Total 1.9 1.5 - 4.5 g/dL    Albumin/Globulin Ratio 2.4 (H) 1.2 - 2.2   Bilirubin Total 0.2 0.0 - 1.2 mg/dL   Alkaline Phosphatase 65 44 - 121 IU/L   AST 23 0 - 40 IU/L   ALT 17 0 - 32 IU/L  Lipid Panel w/o Chol/HDL Ratio  Result Value Ref Range  Cholesterol, Total 147 100 - 199 mg/dL   Triglycerides 79 0 - 149 mg/dL   HDL 66 >39 mg/dL   VLDL Cholesterol Cal 15 5 - 40 mg/dL   LDL Chol Calc (NIH) 66 0 - 99 mg/dL      Assessment & Plan:   Problem List Items Addressed This Visit       Other   Fibromyalgia    Under good control on current regimen. Continue current regimen. Continue to monitor. Call with any concerns. Refills given.        Relevant Medications   meloxicam (MOBIC) 15 MG tablet   citalopram (CELEXA) 40 MG tablet   Other Relevant Orders   Urinalysis, Routine w reflex microscopic   Hypercholesterolemia    Under good control on current regimen. Continue current regimen. Continue to monitor. Call with any concerns. Refills given. Labs drawn today.       Relevant Medications   atorvastatin (LIPITOR) 40 MG tablet   Other Relevant Orders   CBC with Differential/Platelet   Comprehensive metabolic panel   Lipid Panel w/o Chol/HDL Ratio   Insomnia    Under good control on current regimen. Continue current regimen. Continue to monitor. Call with any concerns. Refills given for 6 months. Follow up 6 months.        Recurrent major depressive disorder, in full remission ([place])    Under good control on current regimen. Continue current regimen. Continue to monitor. Call with any concerns. Refills given.        Relevant Medications   citalopram (CELEXA) 40 MG tablet   Other Relevant Orders   CBC with Differential/Platelet   Urinalysis, Routine w reflex microscopic   TSH   Other Visit Diagnoses     Routine general medical examination at a health care facility    -  Primary   Vaccines declined/updated. Screening labs checked today. Mammo, dexa and colonoscopy up to date. Continue diet and  exercise. Call with any concerns.    Screening for skin cancer       Referral to derm placed today.   Relevant Orders   Ambulatory referral to Dermatology   Open wound of left forearm, initial encounter       Well healing wound. Due for Td- given today.        Follow up plan: Return in about 6 months (around 11/13/2021).   LABORATORY TESTING:  - Pap smear: not applicable  IMMUNIZATIONS:   - Tdap: Tetanus vaccination status reviewed: Td vaccination indicated and given today. - Influenza: Refused - Pneumovax: Refused - Prevnar: Refused - COVID: Refused - HPV: Not applicable - Shingrix vaccine: Not applicable  SCREENING: -Mammogram: Up to date  - Colonoscopy: Up to date  - Bone Density: Up to date   PATIENT COUNSELING:   Advised to take 1 mg of folate supplement per day if capable of pregnancy.   Sexuality: Discussed sexually transmitted diseases, partner selection, use of condoms, avoidance of unintended pregnancy  and contraceptive alternatives.   Advised to avoid cigarette smoking.  I discussed with the patient that most people either abstain from alcohol or drink within safe limits (<=14/week and <=4 drinks/occasion for males, <=7/weeks and <= 3 drinks/occasion for females) and that the risk for alcohol disorders and other health effects rises proportionally with the number of drinks per week and how often a drinker exceeds daily limits.  Discussed cessation/primary prevention of drug use and availability of treatment for abuse.   Diet: Encouraged to adjust caloric intake  to maintain  or achieve ideal body weight, to reduce intake of dietary saturated fat and total fat, to limit sodium intake by avoiding high sodium foods and not adding table salt, and to maintain adequate dietary potassium and calcium preferably from fresh fruits, vegetables, and low-fat dairy products.    stressed the importance of regular exercise  Injury prevention: Discussed safety belts, safety  helmets, smoke detector, smoking near bedding or upholstery.   Dental health: Discussed importance of regular tooth brushing, flossing, and dental visits.    NEXT PREVENTATIVE PHYSICAL DUE IN 1 YEAR. Return in about 6 months (around 11/13/2021).

## 2021-05-16 NOTE — Progress Notes (Signed)
I connected with  Kathryn Conway on 05-11-2021 by a telephone enabled telemedicine application and verified that I am speaking with the correct person using two identifiers.   I discussed the limitations of evaluation and management by telemedicine. The patient expressed understanding and agreed to proceed.  Patient location: home  Provider location: tele-health  (not in office)

## 2021-05-17 LAB — COMPREHENSIVE METABOLIC PANEL
ALT: 13 IU/L (ref 0–32)
AST: 19 IU/L (ref 0–40)
Albumin/Globulin Ratio: 2 (ref 1.2–2.2)
Albumin: 4.4 g/dL (ref 3.8–4.8)
Alkaline Phosphatase: 61 IU/L (ref 44–121)
BUN/Creatinine Ratio: 24 (ref 12–28)
BUN: 16 mg/dL (ref 8–27)
Bilirubin Total: <0.2 mg/dL (ref 0.0–1.2)
CO2: 27 mmol/L (ref 20–29)
Calcium: 9.1 mg/dL (ref 8.7–10.3)
Chloride: 101 mmol/L (ref 96–106)
Creatinine, Ser: 0.68 mg/dL (ref 0.57–1.00)
Globulin, Total: 2.2 g/dL (ref 1.5–4.5)
Glucose: 81 mg/dL (ref 70–99)
Potassium: 3.8 mmol/L (ref 3.5–5.2)
Sodium: 139 mmol/L (ref 134–144)
Total Protein: 6.6 g/dL (ref 6.0–8.5)
eGFR: 95 mL/min/{1.73_m2} (ref >59)

## 2021-05-17 LAB — CBC WITH DIFFERENTIAL/PLATELET
Basophils Absolute: 0 10*3/uL (ref 0.0–0.2)
Basos: 0 %
EOS (ABSOLUTE): 0.2 10*3/uL (ref 0.0–0.4)
Eos: 2 %
Hematocrit: 35.6 % (ref 34.0–46.6)
Hemoglobin: 11.7 g/dL (ref 11.1–15.9)
Immature Grans (Abs): 0 10*3/uL (ref 0.0–0.1)
Immature Granulocytes: 0 %
Lymphocytes Absolute: 2.7 10*3/uL (ref 0.7–3.1)
Lymphs: 36 %
MCH: 30.4 pg (ref 26.6–33.0)
MCHC: 32.9 g/dL (ref 31.5–35.7)
MCV: 93 fL (ref 79–97)
Monocytes Absolute: 0.6 10*3/uL (ref 0.1–0.9)
Monocytes: 8 %
Neutrophils Absolute: 4 10*3/uL (ref 1.4–7.0)
Neutrophils: 54 %
Platelets: 204 10*3/uL (ref 150–450)
RBC: 3.85 x10E6/uL (ref 3.77–5.28)
RDW: 12.9 % (ref 11.7–15.4)
WBC: 7.5 10*3/uL (ref 3.4–10.8)

## 2021-05-17 LAB — TSH: TSH: 2.85 u[IU]/mL (ref 0.450–4.500)

## 2021-05-17 LAB — LIPID PANEL W/O CHOL/HDL RATIO
Cholesterol, Total: 149 mg/dL (ref 100–199)
HDL: 70 mg/dL (ref >39)
LDL Chol Calc (NIH): 66 mg/dL (ref 0–99)
Triglycerides: 66 mg/dL (ref 0–149)
VLDL Cholesterol Cal: 13 mg/dL (ref 5–40)

## 2021-06-07 ENCOUNTER — Telehealth: Payer: Self-pay

## 2021-06-07 NOTE — Telephone Encounter (Signed)
PA for Zolpidem initiated and submitted via Cover My Meds. Key: [id number]

## 2021-06-08 NOTE — Telephone Encounter (Signed)
PA approved. Called and notified patient of approval.  

## 2021-11-05 DIAGNOSIS — M797 Fibromyalgia: Secondary | ICD-10-CM | POA: Diagnosis not present

## 2021-11-05 DIAGNOSIS — E785 Hyperlipidemia, unspecified: Secondary | ICD-10-CM | POA: Diagnosis not present

## 2021-11-05 DIAGNOSIS — H10211 Acute toxic conjunctivitis, right eye: Secondary | ICD-10-CM | POA: Diagnosis not present

## 2021-11-05 DIAGNOSIS — H5713 Ocular pain, bilateral: Secondary | ICD-10-CM | POA: Diagnosis not present

## 2021-11-05 DIAGNOSIS — Z882 Allergy status to sulfonamides status: Secondary | ICD-10-CM | POA: Diagnosis not present

## 2021-11-08 ENCOUNTER — Telehealth: Payer: Self-pay | Admitting: *Deleted

## 2021-11-08 NOTE — Telephone Encounter (Signed)
Transition Care Management Follow-up Telephone Call ?Date of discharge and from where: [facility] 11-05-2021 ?How have you been since you were released from the hospital? Feeling great have contacts in today ?Any questions or concerns? No ? ?Items Reviewed: ?Did the pt receive and understand the discharge instructions provided? Yes  ?Medications obtained and verified?  ?Other? No  ?Any new allergies since your discharge? No  ?Dietary orders reviewed? No ?Do you have support at home? Yes  ? ?Home Care and Equipment/Supplies: ?Were home health services ordered? not applicable ?If so, what is the name of the agency?   ?Has the agency set up a time to come to the patient's home? not applicable ?Were any new equipment or medical supplies ordered?  No ?What is the name of the medical supply agency?  ?Were you able to get the supplies/equipment? not applicable ?Do you have any questions related to the use of the equipment or supplies? No ? ?Functional Questionnaire: (I = Independent and D = Dependent) ?ADLs: I ? ?Bathing/Dressing- I ? ?Meal Prep- I ? ?Eating- I ? ?Maintaining continence- I ? ?Transferring/Ambulation- I ? ?Managing Meds- I ? ?Follow up appointments reviewed: ? ?PCP Hospital f/u appt confirmed? No  patient did not want to schedule a follow up.  ?[facility] f/u appt confirmed? No  . ?Are transportation arrangements needed? No  ?If their condition worsens, is the pt aware to call PCP or go to the Emergency Dept.? Yes ?Was the patient provided with contact information for the PCP's office or ED? Yes ?Was to pt encouraged to call back with questions or concerns? Yes  ?

## 2021-11-14 ENCOUNTER — Ambulatory Visit (INDEPENDENT_AMBULATORY_CARE_PROVIDER_SITE_OTHER): Payer: Medicare HMO | Admitting: Family Medicine

## 2021-11-14 ENCOUNTER — Encounter: Payer: Self-pay | Admitting: Family Medicine

## 2021-11-14 VITALS — BP 116/66 | HR 69 | Temp 98.0°F | Wt 127.0 lb

## 2021-11-14 DIAGNOSIS — F3342 Major depressive disorder, recurrent, in full remission: Secondary | ICD-10-CM

## 2021-11-14 DIAGNOSIS — F5101 Primary insomnia: Secondary | ICD-10-CM

## 2021-11-14 DIAGNOSIS — M797 Fibromyalgia: Secondary | ICD-10-CM | POA: Diagnosis not present

## 2021-11-14 DIAGNOSIS — R69 Illness, unspecified: Secondary | ICD-10-CM | POA: Diagnosis not present

## 2021-11-14 DIAGNOSIS — E7800 Pure hypercholesterolemia, unspecified: Secondary | ICD-10-CM

## 2021-11-14 MED ORDER — GABAPENTIN 300 MG PO CAPS: 600.0000 mg | ORAL_CAPSULE | Freq: Two times a day (BID) | ORAL | 3 refills | Status: DC

## 2021-11-14 MED ORDER — CITALOPRAM HYDROBROMIDE 40 MG PO TABS: 40.0000 mg | ORAL_TABLET | Freq: Every day | ORAL | 1 refills | Status: DC

## 2021-11-14 MED ORDER — ATORVASTATIN CALCIUM 40 MG PO TABS
40.0000 mg | ORAL_TABLET | Freq: Every day | ORAL | 1 refills | Status: DC
Start: 2021-11-14 — End: 2022-05-23

## 2021-11-14 MED ORDER — ZOLPIDEM TARTRATE 10 MG PO TABS: 5.0000 mg | ORAL_TABLET | Freq: Every evening | ORAL | 2 refills | Status: DC | PRN

## 2021-11-14 NOTE — Progress Notes (Signed)
? ?BP 116/66   Pulse 69   Temp 98 ?F (36.7 ?C)   Wt 127 lb (57.6 kg)   SpO2 97%   BMI 22.50 kg/m?   ? ?Subjective:  ? ? Patient ID: Kathryn Conway, female    DOB: August 08, 1950, 71 y.o.   MRN: [id number] ? ?HPI: ?Kathryn Conway is a 72 y.o. female ? ?Chief Complaint  ?Patient presents with  ? Depression  ? Hyperlipidemia  ? Insomnia  ? Fibromyalgia  ? ?HYPERLIPIDEMIA ?Hyperlipidemia status: excellent compliance ?Satisfied with current treatment?  yes ?Side effects:  no ?Medication compliance: excellent compliance ?Past cholesterol meds: atorvastatin ?Supplements: none ?Aspirin:  no ?The 10-year ASCVD risk score (Arnett DK, et al., 2019) is: 5.3% ?  Values used to calculate the score: ?    Age: 71 years ?    Sex: Female ?    Is Non-Hispanic African American: No ?    Diabetic: No ?    Tobacco smoker: No ?    Systolic Blood Pressure: 937 mmHg ?    Is BP treated: No ?    HDL Cholesterol: 70 mg/dL ?    Total Cholesterol: 149 mg/dL ?Chest pain:  no ?Coronary artery disease:  no ? ?DEPRESSION ?Mood status: controlled ?Satisfied with current treatment?: no ?Symptom severity: mild  ?Duration of current treatment : chronic ?Side effects: no ?Medication compliance: excellent compliance ?Psychotherapy/counseling: no  ?Previous psychiatric medications: celexa ?Depressed mood: no ?Anxious mood: no ?Anhedonia: no ?Significant weight loss or gain: no ?Insomnia: yes  ?Fatigue: yes ?Feelings of worthlessness or guilt: no ?Impaired concentration/indecisiveness: no ?Suicidal ideations: no ?Hopelessness: no ?Crying spells: no ? ?  11/14/2021  ?  2:09 PM 05/16/2021  ?  3:06 PM 05/11/2021  ?  9:56 AM 11/08/2020  ?  4:25 PM 05/10/2020  ?  2:04 PM  ?Depression screen PHQ 2/9  ?Decreased Interest 0 0 0 0 0  ?Down, Depressed, Hopeless 0 0 0 0 0  ?PHQ - 2 Score 0 0 0 0 0  ?Altered sleeping 2 0  0 0  ?Tired, decreased energy 1 0  0 0  ?Change in appetite 0 0  0 0  ?Feeling bad or failure about yourself  0 0  0 0  ?Trouble concentrating 0 0   0 0  ?Moving slowly or fidgety/restless 0 0  0 0  ?Suicidal thoughts 0 0  0 0  ?PHQ-9 Score 3 0  0 0  ?Difficult doing work/chores  Not difficult at all     ? ? ?INSOMNIA ?Duration: chronic ?Satisfied with sleep quality: yes ?Difficulty falling asleep: no ?Difficulty staying asleep: no ?Waking a few hours after sleep onset: no ?Early morning awakenings: no ?Daytime hypersomnolence: no ?Wakes feeling refreshed: no ?Good sleep hygiene: no ?Apnea: no ?Snoring: no ?Depressed/anxious mood: yes ?Recent stress: no ?Restless legs/nocturnal leg cramps: no ?Chronic pain/arthritis: no ?History of sleep study: no ?Treatments attempted: melatonin, uinsom, benadryl, and ambien  ? ?Relevant past medical, surgical, family and social history reviewed and updated as indicated. Interim medical history since our last visit reviewed. ?Allergies and medications reviewed and updated. ? ?Review of Systems  ?Constitutional: Negative.   ?Respiratory: Negative.    ?Cardiovascular: Negative.   ?Gastrointestinal: Negative.   ?Musculoskeletal: Negative.   ?Neurological: Negative.   ?Psychiatric/Behavioral: Negative.    ? ?Per HPI unless specifically indicated above ? ?   ?Objective:  ?  ?BP 116/66   Pulse 69   Temp 98 ?F (36.7 ?C)   Wt 127 lb (  57.6 kg)   SpO2 97%   BMI 22.50 kg/m?   ?Wt Readings from Last 3 Encounters:  ?11/14/21 127 lb (57.6 kg)  ?05/16/21 138 lb 3.2 oz (62.7 kg)  ?02/01/21 134 lb 6.4 oz (61 kg)  ?  ?Physical Exam ?Vitals and nursing note reviewed.  ?Constitutional:   ?   General: She is not in acute distress. ?   Appearance: Normal appearance. She is not ill-appearing, toxic-appearing or diaphoretic.  ?HENT:  ?   Head: Normocephalic and atraumatic.  ?   Right Ear: External ear normal.  ?   Left Ear: External ear normal.  ?   Nose: Nose normal.  ?   Mouth/Throat:  ?   Mouth: Mucous membranes are moist.  ?   Pharynx: Oropharynx is clear.  ?Eyes:  ?   General: No scleral icterus.    ?   Right eye: No discharge.     ?   Left  eye: No discharge.  ?   Extraocular Movements: Extraocular movements intact.  ?   Conjunctiva/sclera: Conjunctivae normal.  ?   Pupils: Pupils are equal, round, and reactive to light.  ?Cardiovascular:  ?   Rate and Rhythm: Normal rate and regular rhythm.  ?   Pulses: Normal pulses.  ?   Heart sounds: Normal heart sounds. No murmur heard. ?  No friction rub. No gallop.  ?Pulmonary:  ?   Effort: Pulmonary effort is normal. No respiratory distress.  ?   Breath sounds: Normal breath sounds. No stridor. No wheezing, rhonchi or rales.  ?Chest:  ?   Chest wall: No tenderness.  ?Musculoskeletal:     ?   General: Normal range of motion.  ?   Cervical back: Normal range of motion and neck supple.  ?Skin: ?   General: Skin is warm and dry.  ?   Capillary Refill: Capillary refill takes less than 2 seconds.  ?   Coloration: Skin is not jaundiced or pale.  ?   Findings: No bruising, erythema, lesion or rash.  ?Neurological:  ?   General: No focal deficit present.  ?   Mental Status: She is alert and oriented to person, place, and time. Mental status is at baseline.  ?Psychiatric:     ?   Mood and Affect: Mood normal.     ?   Behavior: Behavior normal.     ?   Thought Content: Thought content normal.     ?   Judgment: Judgment normal.  ? ? ?Results for orders placed or performed in visit on 05/16/21  ?Microscopic Examination  ? Urine  ?Result Value Ref Range  ? WBC, UA None seen 0 - 5 /hpf  ? RBC 0-2 0 - 2 /hpf  ? Epithelial Cells (non renal) None seen 0 - 10 /hpf  ? Bacteria, UA None seen None seen/Few  ?CBC with Differential/Platelet  ?Result Value Ref Range  ? WBC 7.5 3.4 - 10.8 x10E3/uL  ? RBC 3.85 3.77 - 5.28 x10E6/uL  ? Hemoglobin 11.7 11.1 - 15.9 g/dL  ? Hematocrit 35.6 34.0 - 46.6 %  ? MCV 93 79 - 97 fL  ? MCH 30.4 26.6 - 33.0 pg  ? MCHC 32.9 31.5 - 35.7 g/dL  ? RDW 12.9 11.7 - 15.4 %  ? Platelets 204 150 - 450 x10E3/uL  ? Neutrophils 54 Not Estab. %  ? Lymphs 36 Not Estab. %  ? Monocytes 8 Not Estab. %  ? Eos 2 Not  Estab. %  ?  Basos 0 Not Estab. %  ? Neutrophils Absolute 4.0 1.4 - 7.0 x10E3/uL  ? Lymphocytes Absolute 2.7 0.7 - 3.1 x10E3/uL  ? Monocytes Absolute 0.6 0.1 - 0.9 x10E3/uL  ? EOS (ABSOLUTE) 0.2 0.0 - 0.4 x10E3/uL  ? Basophils Absolute 0.0 0.0 - 0.2 x10E3/uL  ? Immature Granulocytes 0 Not Estab. %  ? Immature Grans (Abs) 0.0 0.0 - 0.1 x10E3/uL  ?Comprehensive metabolic panel  ?Result Value Ref Range  ? Glucose 81 70 - 99 mg/dL  ? BUN 16 8 - 27 mg/dL  ? Creatinine, Ser 0.68 0.57 - 1.00 mg/dL  ? eGFR 95 >59 mL/min/1.73  ? BUN/Creatinine Ratio 24 12 - 28  ? Sodium 139 134 - 144 mmol/L  ? Potassium 3.8 3.5 - 5.2 mmol/L  ? Chloride 101 96 - 106 mmol/L  ? CO2 27 20 - 29 mmol/L  ? Calcium 9.1 8.7 - 10.3 mg/dL  ? Total Protein 6.6 6.0 - 8.5 g/dL  ? Albumin 4.4 3.8 - 4.8 g/dL  ? Globulin, Total 2.2 1.5 - 4.5 g/dL  ? Albumin/Globulin Ratio 2.0 1.2 - 2.2  ? Bilirubin Total <0.2 0.0 - 1.2 mg/dL  ? Alkaline Phosphatase 61 44 - 121 IU/L  ? AST 19 0 - 40 IU/L  ? ALT 13 0 - 32 IU/L  ?Lipid Panel w/o Chol/HDL Ratio  ?Result Value Ref Range  ? Cholesterol, Total 149 100 - 199 mg/dL  ? Triglycerides 66 0 - 149 mg/dL  ? HDL 70 >39 mg/dL  ? VLDL Cholesterol Cal 13 5 - 40 mg/dL  ? LDL Chol Calc (NIH) 66 0 - 99 mg/dL  ?Urinalysis, Routine w reflex microscopic  ?Result Value Ref Range  ? Specific Gravity, UA >1.030 (H) 1.005 - 1.030  ? pH, UA 5.5 5.0 - 7.5  ? Color, UA Yellow Yellow  ? Appearance Ur Clear Clear  ? Leukocytes,UA Negative Negative  ? Protein,UA Negative Negative/Trace  ? Glucose, UA Negative Negative  ? Ketones, UA Negative Negative  ? RBC, UA Trace (A) Negative  ? Bilirubin, UA Negative Negative  ? Urobilinogen, Ur 1.0 0.2 - 1.0 mg/dL  ? Nitrite, UA Negative Negative  ? Microscopic Examination See below:   ?TSH  ?Result Value Ref Range  ? TSH 2.850 0.450 - 4.500 uIU/mL  ? ?   ?Assessment & Plan:  ? ?Problem List Items Addressed This Visit   ? ?  ? Other  ? Fibromyalgia  ?  Under good control on current regimen. Continue  current regimen. Continue to monitor. Call with any concerns. Refills given. Follow up 6 months. ? ? ?  ?  ? Relevant Medications  ? citalopram (CELEXA) 40 MG tablet  ? gabapentin (NEURONTIN) 300 MG capsule  ? Hypercho

## 2021-11-14 NOTE — Assessment & Plan Note (Signed)
Under good control on current regimen. Continue current regimen. Continue to monitor. Call with any concerns. Refills given for 6 months. Follow up in 6 months.  

## 2021-11-14 NOTE — Assessment & Plan Note (Signed)
Under good control on current regimen. Continue current regimen. Continue to monitor. Call with any concerns. Refills given.   

## 2021-11-14 NOTE — Assessment & Plan Note (Signed)
Under good control on current regimen. Continue current regimen. Continue to monitor. Call with any concerns. Refills given. Follow up 6 months  

## 2021-11-14 NOTE — Assessment & Plan Note (Signed)
Under good control on current regimen. Continue current regimen. Continue to monitor. Call with any concerns. Refills given. Labs drawn today.   

## 2021-11-15 LAB — LIPID PANEL W/O CHOL/HDL RATIO
Cholesterol, Total: 156 mg/dL (ref 100–199)
HDL: 65 mg/dL (ref >39)
LDL Chol Calc (NIH): 80 mg/dL (ref 0–99)
Triglycerides: 53 mg/dL (ref 0–149)
VLDL Cholesterol Cal: 11 mg/dL (ref 5–40)

## 2021-11-15 LAB — COMPREHENSIVE METABOLIC PANEL
ALT: 17 IU/L (ref 0–32)
AST: 21 IU/L (ref 0–40)
Albumin/Globulin Ratio: 2 (ref 1.2–2.2)
Albumin: 4.3 g/dL (ref 3.8–4.8)
Alkaline Phosphatase: 52 IU/L (ref 44–121)
BUN/Creatinine Ratio: 32 — ABNORMAL HIGH (ref 12–28)
BUN: 20 mg/dL (ref 8–27)
Bilirubin Total: 0.3 mg/dL (ref 0.0–1.2)
CO2: 23 mmol/L (ref 20–29)
Calcium: 8.8 mg/dL (ref 8.7–10.3)
Chloride: 104 mmol/L (ref 96–106)
Creatinine, Ser: 0.63 mg/dL (ref 0.57–1.00)
Globulin, Total: 2.2 g/dL (ref 1.5–4.5)
Glucose: 83 mg/dL (ref 70–99)
Potassium: 4.2 mmol/L (ref 3.5–5.2)
Sodium: 140 mmol/L (ref 134–144)
Total Protein: 6.5 g/dL (ref 6.0–8.5)
eGFR: 97 mL/min/{1.73_m2} (ref >59)

## 2022-04-10 ENCOUNTER — Encounter: Payer: Self-pay | Admitting: Family Medicine

## 2022-04-18 ENCOUNTER — Other Ambulatory Visit: Payer: Self-pay | Admitting: Family Medicine

## 2022-04-18 DIAGNOSIS — Z1231 Encounter for screening mammogram for malignant neoplasm of breast: Secondary | ICD-10-CM

## 2022-05-09 DIAGNOSIS — H524 Presbyopia: Secondary | ICD-10-CM | POA: Diagnosis not present

## 2022-05-16 ENCOUNTER — Ambulatory Visit
Admission: RE | Admit: 2022-05-16 | Discharge: 2022-05-16 | Disposition: A | Discharge status: Home / Self Care | Payer: Medicare HMO | Source: Ambulatory Visit | Attending: Family Medicine | Admitting: Family Medicine

## 2022-05-16 DIAGNOSIS — Z1231 Encounter for screening mammogram for malignant neoplasm of breast: Secondary | ICD-10-CM | POA: Diagnosis not present

## 2022-05-19 ENCOUNTER — Encounter: Payer: Medicare HMO | Admitting: Family Medicine

## 2022-05-23 ENCOUNTER — Encounter: Payer: Self-pay | Admitting: Family Medicine

## 2022-05-23 ENCOUNTER — Ambulatory Visit (INDEPENDENT_AMBULATORY_CARE_PROVIDER_SITE_OTHER): Payer: Medicare HMO | Admitting: Family Medicine

## 2022-05-23 VITALS — BP 111/67 | HR 75 | Temp 98.1°F | Ht 63.2 in | Wt 136.5 lb

## 2022-05-23 DIAGNOSIS — M816 Localized osteoporosis [Lequesne]: Secondary | ICD-10-CM | POA: Diagnosis not present

## 2022-05-23 DIAGNOSIS — Z7189 Other specified counseling: Secondary | ICD-10-CM

## 2022-05-23 DIAGNOSIS — Z0000 Encounter for general adult medical examination without abnormal findings: Secondary | ICD-10-CM | POA: Diagnosis not present

## 2022-05-23 DIAGNOSIS — R69 Illness, unspecified: Secondary | ICD-10-CM | POA: Diagnosis not present

## 2022-05-23 DIAGNOSIS — F3342 Major depressive disorder, recurrent, in full remission: Secondary | ICD-10-CM

## 2022-05-23 DIAGNOSIS — N952 Postmenopausal atrophic vaginitis: Secondary | ICD-10-CM

## 2022-05-23 DIAGNOSIS — E7800 Pure hypercholesterolemia, unspecified: Secondary | ICD-10-CM

## 2022-05-23 DIAGNOSIS — F5101 Primary insomnia: Secondary | ICD-10-CM | POA: Diagnosis not present

## 2022-05-23 LAB — URINALYSIS, ROUTINE W REFLEX MICROSCOPIC
Bilirubin, UA: NEGATIVE
Glucose, UA: NEGATIVE
Ketones, UA: NEGATIVE
Nitrite, UA: NEGATIVE
Protein,UA: NEGATIVE
Specific Gravity, UA: 1.025 (ref 1.005–1.030)
Urobilinogen, Ur: 0.2 mg/dL (ref 0.2–1.0)
pH, UA: 5.5 (ref 5.0–7.5)

## 2022-05-23 LAB — MICROSCOPIC EXAMINATION: Bacteria, UA: NONE SEEN

## 2022-05-23 MED ORDER — CITALOPRAM HYDROBROMIDE 40 MG PO TABS: 40.0000 mg | ORAL_TABLET | Freq: Every day | ORAL | 1 refills | Status: DC

## 2022-05-23 MED ORDER — GABAPENTIN 300 MG PO CAPS: 600.0000 mg | ORAL_CAPSULE | Freq: Two times a day (BID) | ORAL | 3 refills | Status: DC

## 2022-05-23 MED ORDER — ATORVASTATIN CALCIUM 40 MG PO TABS: 40.0000 mg | ORAL_TABLET | Freq: Every day | ORAL | 1 refills | Status: DC

## 2022-05-23 MED ORDER — ZOLPIDEM TARTRATE 10 MG PO TABS: 5.0000 mg | ORAL_TABLET | Freq: Every evening | ORAL | 2 refills | Status: DC | PRN

## 2022-05-23 MED ORDER — ALENDRONATE SODIUM 70 MG PO TABS: 70.0000 mg | ORAL_TABLET | ORAL | 0 refills | Status: DC

## 2022-05-23 NOTE — Progress Notes (Signed)
BP 111/67   Pulse 75   Temp 98.1 F (36.7 C) (Oral)   Ht 5' 3.2" (1.605 m)   Wt 136 lb 8 oz (61.9 kg)   SpO2 96%   BMI 24.03 kg/m    Subjective:    Patient ID: Kathryn Conway, female    DOB: 21-Nov-1950, 71 y.o.   MRN: [id number]  HPI: Kathryn Conway is a 71 y.o. female presenting on 05/23/2022 for comprehensive medical examination. Current medical complaints include:  HYPERLIPIDEMIA Hyperlipidemia status: excellent compliance Satisfied with current treatment?  yes Side effects:  no Medication compliance: excellent compliance Past cholesterol meds: atorvastatin Supplements: fish oil Aspirin:  no The 10-year ASCVD risk score (Arnett DK, et al., 2019) is: 5.4%   Values used to calculate the score:     Age: 54 years     Sex: Female     Is Non-Hispanic African American: No     Diabetic: No     Tobacco smoker: No     Systolic Blood Pressure: 350 mmHg     Is BP treated: No     HDL Cholesterol: 73 mg/dL     Total Cholesterol: 142 mg/dL Chest pain:  no Coronary artery disease:  no  DEPRESSION Mood status: stable Satisfied with current treatment?: yes Symptom severity: mild  Duration of current treatment : chronic Side effects: no Medication compliance: excellent compliance Psychotherapy/counseling: no  Previous psychiatric medications:celexa Depressed mood: no Anxious mood: no Anhedonia: no Significant weight loss or gain: no Insomnia: yes  Fatigue: no Feelings of worthlessness or guilt: no Impaired concentration/indecisiveness: no Suicidal ideations: no Hopelessness: no Crying spells: no    05/24/2022   10:47 AM 11/14/2021    2:09 PM 05/16/2021    3:06 PM 05/11/2021    9:56 AM 11/08/2020    4:25 PM  Depression screen PHQ 2/9  Decreased Interest 0 0 0 0 0  Down, Depressed, Hopeless 0 0 0 0 0  PHQ - 2 Score 0 0 0 0 0  Altered sleeping 3 2 0  0  Tired, decreased energy 2 1 0  0  Change in appetite 0 0 0  0  Feeling bad or failure about yourself  0 0  0  0  Trouble concentrating 0 0 0  0  Moving slowly or fidgety/restless 0 0 0  0  Suicidal thoughts 0 0 0  0  PHQ-9 Score 5 3 0  0  Difficult doing work/chores Not difficult at all  Not difficult at all     INSOMNIA Duration: chronic Satisfied with sleep quality: yes Difficulty falling asleep: no Difficulty staying asleep: no Waking a few hours after sleep onset: no Early morning awakenings: no Daytime hypersomnolence: no Wakes feeling refreshed: no Good sleep hygiene: no Apnea: no Snoring: no Depressed/anxious mood: no Recent stress: no Restless legs/nocturnal leg cramps: no Chronic pain/arthritis: no Treatments attempted: melatonin, uinsom, benadryl, and ambien  She currently lives with: husband Menopausal Symptoms: no  Functional Status Survey: Is the patient deaf or have difficulty hearing?: No Does the patient have difficulty seeing, even when wearing glasses/contacts?: No Does the patient have difficulty concentrating, remembering, or making decisions?: No Does the patient have difficulty walking or climbing stairs?: No Does the patient have difficulty dressing or bathing?: No Does the patient have difficulty doing errands alone such as visiting a doctor's office or shopping?: No     05/16/2021    3:07 PM 05/11/2021    9:57 AM 05/10/2020  1:02 PM 05/09/2019    3:29 PM 05/01/2019    3:15 PM  Fall Risk   Falls in the past year? 0 0 0 0 0  Number falls in past yr: 0 0  0 0  Injury with Fall? 0 0  0 0  Risk for fall due to : No Fall Risks  Medication side effect    Follow up Falls evaluation completed Falls evaluation completed;Education provided;Falls prevention discussed Falls evaluation completed;Education provided;Falls prevention discussed Falls evaluation completed     Depression Screen    05/24/2022   10:47 AM 11/14/2021    2:09 PM 05/16/2021    3:06 PM 05/11/2021    9:56 AM 11/08/2020    4:25 PM  Depression screen PHQ 2/9  Decreased Interest 0 0 0 0 0   Down, Depressed, Hopeless 0 0 0 0 0  PHQ - 2 Score 0 0 0 0 0  Altered sleeping 3 2 0  0  Tired, decreased energy 2 1 0  0  Change in appetite 0 0 0  0  Feeling bad or failure about yourself  0 0 0  0  Trouble concentrating 0 0 0  0  Moving slowly or fidgety/restless 0 0 0  0  Suicidal thoughts 0 0 0  0  PHQ-9 Score 5 3 0  0  Difficult doing work/chores Not difficult at all  Not difficult at all      Advanced Directives Does patient have a HCPOA?    no Does patient have a living will or MOST form?  no  Past Medical History:  Past Medical History:  Diagnosis Date   Depression    Fibromyalgia    Hyperlipidemia    Insomnia    PONV (postoperative nausea and vomiting)    TMJ (temporomandibular joint disorder)    Uterine fibroid    Wears contact lenses    Wears dentures    full upper, partial lower    Surgical History:  Past Surgical History:  Procedure Laterality Date   COLONOSCOPY  05/20/2012   repeat 5 years   COLONOSCOPY WITH PROPOFOL N/A 04/26/2018   Procedure: COLONOSCOPY WITH PROPOFOL;  Surgeon: Lucilla Lame, MD;  Location: [facility];  Service: Endoscopy;  Laterality: N/A;   laparoscopic gyn procedure     done before her hysterectomy   OOPHORECTOMY Left    TOTAL ABDOMINAL HYSTERECTOMY      Medications:  Current Outpatient Medications on File Prior to Visit  Medication Sig   loratadine (CLARITIN) 10 MG tablet Take 10 mg by mouth daily.   MEGARED OMEGA-3 KRILL OIL PO Take by mouth daily.   meloxicam (MOBIC) 15 MG tablet Take 15 mg by mouth 3 (three) times daily.   No current facility-administered medications on file prior to visit.    Allergies:  Allergies  Allergen Reactions   Sulfa Antibiotics Rash    Social History:  Social History   Socioeconomic History   Marital status: Married    Spouse name: Not on file   Number of children: Not on file   Years of education: Not on file   Highest education level: Not on file  Occupational History    Occupation: part time   Tobacco Use   Smoking status: Never   Smokeless tobacco: Never  Vaping Use   Vaping Use: Never used  Substance and Sexual Activity   Alcohol use: Yes    Alcohol/week: 3.0 standard drinks of alcohol    Types: 3 Glasses of wine  per week   Drug use: No   Sexual activity: Yes    Birth control/protection: Surgical    Comment: Hysterectomy  Other Topics Concern   Not on file  Social History Narrative   Not on file   Social Determinants of Health   Financial Resource Strain: Low Risk  (05/11/2021)   Overall Financial Resource Strain (CARDIA)    Difficulty of Paying Living Expenses: Not hard at all  Food Insecurity: No Food Insecurity (05/11/2021)   Hunger Vital Sign    Worried About Running Out of Food in the Last Year: Never true    Ran Out of Food in the Last Year: Never true  Transportation Needs: No Transportation Needs (05/11/2021)   PRAPARE - Hydrologist (Medical): No    Lack of Transportation (Non-Medical): No  Physical Activity: Inactive (05/11/2021)   Exercise Vital Sign    Days of Exercise per Week: 0 days    Minutes of Exercise per Session: 0 min  Stress: No Stress Concern Present (05/11/2021)   [place]    Feeling of Stress : Not at all  Social Connections: Moderately Integrated (05/11/2021)   Social Connection and Isolation Panel [NHANES]    Frequency of Communication with Friends and Family: More than three times a week    Frequency of Social Gatherings with Friends and Family: More than three times a week    Attends Religious Services: More than 4 times per year    Active Member of [employer] or Organizations: No    Attends Archivist Meetings: Never    Marital Status: Married  Human resources officer Violence: Not At Risk (05/11/2021)   Humiliation, Afraid, Rape, and Kick questionnaire    Fear of Current or Ex-Partner: No    Emotionally Abused:  No    Physically Abused: No    Sexually Abused: No   Social History   Tobacco Use  Smoking Status Never  Smokeless Tobacco Never   Social History   Substance and Sexual Activity  Alcohol Use Yes   Alcohol/week: 3.0 standard drinks of alcohol   Types: 3 Glasses of wine per week    Family History:  Family History  Problem Relation Age of Onset   Heart disease Mother    Hypertension Mother    Depression Mother    Osteoporosis Mother    Colon polyps Mother    Cirrhosis Mother        non alcoholic   Liver disease Mother        fatty liver disease   Heart disease Father    Hypertension Father    Colon polyps Father    Stroke Father    Heart disease Sister    Hypertension Sister    Colon polyps Sister    Diabetes Sister    Hypertension Daughter    Colon polyps Daughter    Heart attack Brother    Hypertension Brother    Heart attack Brother    Hyperlipidemia Son    Breast cancer Neg Hx     Past medical history, surgical history, medications, allergies, family history and social history reviewed with patient today and changes made to appropriate areas of the chart.   Review of Systems  Constitutional: Negative.   HENT: Negative.    Eyes: Negative.   Respiratory: Negative.    Cardiovascular: Negative.   Gastrointestinal: Negative.   Genitourinary: Negative.   Musculoskeletal:  Positive for myalgias. Negative for  back pain, falls, joint pain and neck pain.  Skin: Negative.   Neurological: Negative.   Endo/Heme/Allergies: Negative.   Psychiatric/Behavioral:  Negative for depression, hallucinations, memory loss, substance abuse and suicidal ideas. The patient has insomnia. The patient is not nervous/anxious.     All other ROS negative except what is listed above and in the HPI.      Objective:    BP 111/67   Pulse 75   Temp 98.1 F (36.7 C) (Oral)   Ht 5' 3.2" (1.605 m)   Wt 136 lb 8 oz (61.9 kg)   SpO2 96%   BMI 24.03 kg/m   Wt Readings from Last 3  Encounters:  05/23/22 136 lb 8 oz (61.9 kg)  11/14/21 127 lb (57.6 kg)  05/16/21 138 lb 3.2 oz (62.7 kg)     Physical Exam Vitals and nursing note reviewed.  Constitutional:      General: She is not in acute distress.    Appearance: Normal appearance. She is not ill-appearing, toxic-appearing or diaphoretic.  HENT:     Head: Normocephalic and atraumatic.     Right Ear: Tympanic membrane, ear canal and external ear normal. There is no impacted cerumen.     Left Ear: Tympanic membrane, ear canal and external ear normal. There is no impacted cerumen.     Nose: Nose normal. No congestion or rhinorrhea.     Mouth/Throat:     Mouth: Mucous membranes are moist.     Pharynx: Oropharynx is clear. No oropharyngeal exudate or posterior oropharyngeal erythema.  Eyes:     General: No scleral icterus.       Right eye: No discharge.        Left eye: No discharge.     Extraocular Movements: Extraocular movements intact.     Conjunctiva/sclera: Conjunctivae normal.     Pupils: Pupils are equal, round, and reactive to light.  Neck:     Vascular: No carotid bruit.  Cardiovascular:     Rate and Rhythm: Normal rate and regular rhythm.     Pulses: Normal pulses.     Heart sounds: No murmur heard.    No friction rub. No gallop.  Pulmonary:     Effort: Pulmonary effort is normal. No respiratory distress.     Breath sounds: Normal breath sounds. No stridor. No wheezing, rhonchi or rales.  Chest:     Chest wall: No tenderness.  Abdominal:     General: Abdomen is flat. Bowel sounds are normal. There is no distension.     Palpations: Abdomen is soft. There is no mass.     Tenderness: There is no abdominal tenderness. There is no right CVA tenderness, left CVA tenderness, guarding or rebound.     Hernia: No hernia is present.  Genitourinary:    Comments: Breast and pelvic exams deferred with shared decision making Musculoskeletal:        General: No swelling, tenderness, deformity or signs of  injury.     Cervical back: Normal range of motion and neck supple. No rigidity. No muscular tenderness.     Right lower leg: No edema.     Left lower leg: No edema.  Lymphadenopathy:     Cervical: No cervical adenopathy.  Skin:    General: Skin is warm and dry.     Capillary Refill: Capillary refill takes less than 2 seconds.     Coloration: Skin is not jaundiced or pale.     Findings: No bruising, erythema, lesion or rash.  Neurological:     General: No focal deficit present.     Mental Status: She is alert and oriented to person, place, and time. Mental status is at baseline.     Cranial Nerves: No cranial nerve deficit.     Sensory: No sensory deficit.     Motor: No weakness.     Coordination: Coordination normal.     Gait: Gait normal.     Deep Tendon Reflexes: Reflexes normal.  Psychiatric:        Mood and Affect: Mood normal.        Behavior: Behavior normal.        Thought Content: Thought content normal.        Judgment: Judgment normal.        05/23/2022    4:15 PM 05/10/2020    1:09 PM 05/01/2019    3:24 PM 04/02/2018    4:07 PM  6CIT Screen  What Year? 0 points 0 points 0 points 0 points  What month? 0 points 0 points 0 points 0 points  What time? 0 points 0 points 0 points 0 points  Count back from 20 0 points 0 points 0 points 0 points  Months in reverse 0 points 0 points 0 points 0 points  Repeat phrase 2 points 2 points 0 points 0 points  Total Score 2 points 2 points 0 points 0 points    Results for orders placed or performed in visit on 05/23/22  Microscopic Examination   Urine  Result Value Ref Range   WBC, UA 0-5 0 - 5 /hpf   RBC, Urine 0-2 0 - 2 /hpf   Epithelial Cells (non renal) 0-10 0 - 10 /hpf   Bacteria, UA None seen None seen/Few  CBC with Differential/Platelet  Result Value Ref Range   WBC 7.0 3.4 - 10.8 x10E3/uL   RBC 3.70 (L) 3.77 - 5.28 x10E6/uL   Hemoglobin 11.3 11.1 - 15.9 g/dL   Hematocrit 34.5 34.0 - 46.6 %   MCV 93 79 - 97 fL    MCH 30.5 26.6 - 33.0 pg   MCHC 32.8 31.5 - 35.7 g/dL   RDW 12.9 11.7 - 15.4 %   Platelets 216 150 - 450 x10E3/uL   Neutrophils 50 Not Estab. %   Lymphs 39 Not Estab. %   Monocytes 8 Not Estab. %   Eos 3 Not Estab. %   Basos 0 Not Estab. %   Neutrophils Absolute 3.5 1.4 - 7.0 x10E3/uL   Lymphocytes Absolute 2.7 0.7 - 3.1 x10E3/uL   Monocytes Absolute 0.6 0.1 - 0.9 x10E3/uL   EOS (ABSOLUTE) 0.2 0.0 - 0.4 x10E3/uL   Basophils Absolute 0.0 0.0 - 0.2 x10E3/uL   Immature Granulocytes 0 Not Estab. %   Immature Grans (Abs) 0.0 0.0 - 0.1 x10E3/uL  Comprehensive metabolic panel  Result Value Ref Range   Glucose 79 70 - 99 mg/dL   BUN 20 8 - 27 mg/dL   Creatinine, Ser 0.74 0.57 - 1.00 mg/dL   eGFR 88 >59 mL/min/1.73   BUN/Creatinine Ratio 27 12 - 28   Sodium 141 134 - 144 mmol/L   Potassium 4.0 3.5 - 5.2 mmol/L   Chloride 103 96 - 106 mmol/L   CO2 23 20 - 29 mmol/L   Calcium 9.2 8.7 - 10.3 mg/dL   Total Protein 6.9 6.0 - 8.5 g/dL   Albumin 4.7 3.9 - 4.9 g/dL   Globulin, Total 2.2 1.5 - 4.5 g/dL   Albumin/Globulin Ratio 2.1  1.2 - 2.2   Bilirubin Total 0.2 0.0 - 1.2 mg/dL   Alkaline Phosphatase 53 44 - 121 IU/L   AST 24 0 - 40 IU/L   ALT 19 0 - 32 IU/L  Lipid Panel w/o Chol/HDL Ratio  Result Value Ref Range   Cholesterol, Total 142 100 - 199 mg/dL   Triglycerides 48 0 - 149 mg/dL   HDL 73 >39 mg/dL   VLDL Cholesterol Cal 11 5 - 40 mg/dL   LDL Chol Calc (NIH) 58 0 - 99 mg/dL  Urinalysis, Routine w reflex microscopic  Result Value Ref Range   Specific Gravity, UA 1.025 1.005 - 1.030   pH, UA 5.5 5.0 - 7.5   Color, UA Yellow Yellow   Appearance Ur Clear Clear   Leukocytes,UA 1+ (A) Negative   Protein,UA Negative Negative/Trace   Glucose, UA Negative Negative   Ketones, UA Negative Negative   RBC, UA Trace (A) Negative   Bilirubin, UA Negative Negative   Urobilinogen, Ur 0.2 0.2 - 1.0 mg/dL   Nitrite, UA Negative Negative   Microscopic Examination See below:   TSH  Result  Value Ref Range   TSH 3.680 0.450 - 4.500 uIU/mL      Assessment & Plan:   Problem List Items Addressed This Visit       Musculoskeletal and Integument   Osteoporosis    Due for repeat DEXA. Ordered today. Await results.       Relevant Medications   alendronate (FOSAMAX) 70 MG tablet   Other Relevant Orders   DG Bone Density     Genitourinary   Postmenopausal atrophic vaginitis    Not on treatment. Checking UA today. Await results.       Relevant Orders   Urinalysis, Routine w reflex microscopic (Completed)     Other   Hypercholesterolemia    Under good control on current regimen. Continue current regimen. Continue to monitor. Call with any concerns. Refills given. Labs drawn today.        Relevant Medications   atorvastatin (LIPITOR) 40 MG tablet   Other Relevant Orders   CBC with Differential/Platelet (Completed)   Comprehensive metabolic panel (Completed)   Lipid Panel w/o Chol/HDL Ratio (Completed)   Insomnia   Relevant Orders   CBC with Differential/Platelet (Completed)   Comprehensive metabolic panel (Completed)   TSH (Completed)   Recurrent major depressive disorder, in full remission ([place])    Under good control on current regimen. Continue current regimen. Continue to monitor. Call with any concerns. Refills given. Labs drawn today.       Relevant Medications   citalopram (CELEXA) 40 MG tablet   Other Relevant Orders   CBC with Differential/Platelet (Completed)   Comprehensive metabolic panel (Completed)   TSH (Completed)   Advance directive discussed with patient   Other Visit Diagnoses     Encounter for Medicare annual wellness exam    -  Primary   Preventative care discussed today as below.   Routine general medical examination at a health care facility       Vaccines up to date/declined. Screening labs checked today. Mammo and colonoscopy up to date. DEXA ordered. Continue diet and exercise. Call with any concerns.        Preventative  Services:  Health Risk Assessment and Personalized Prevention Plan: Done today Bone Mass Measurements: Ordered today Breast Cancer Screening: Up to date CVD Screening: Done today Cervical Cancer Screening: N/A Colon Cancer Screening: Up to date Depression Screening: Done today Diabetes  Screening: Done today Glaucoma Screening: see your eye doctor Hepatitis B vaccine: N/A Hepatitis C screening: Up to date HIV Screening: Up to date Flu Vaccine: Declined  Lung cancer Screening: N/A Obesity Screening: Done today Pneumonia Vaccines (2): Declined STI Screening: N/A  Follow up plan: Return in about 6 months (around 11/21/2022).   LABORATORY TESTING:  - Pap smear: not applicable  IMMUNIZATIONS:   - Tdap: Tetanus vaccination status reviewed: last tetanus booster within 10 years. - Influenza: Refused - Pneumovax: Refused - Prevnar: Refused - Zostavax vaccine: Up to date  SCREENING: -Mammogram: Up to date  - Colonoscopy: Up to date  - Bone Density: Ordered today   PATIENT COUNSELING:   Advised to take 1 mg of folate supplement per day if capable of pregnancy.   Sexuality: Discussed sexually transmitted diseases, partner selection, use of condoms, avoidance of unintended pregnancy  and contraceptive alternatives.   Advised to avoid cigarette smoking.  I discussed with the patient that most people either abstain from alcohol or drink within safe limits (<=14/week and <=4 drinks/occasion for males, <=7/weeks and <= 3 drinks/occasion for females) and that the risk for alcohol disorders and other health effects rises proportionally with the number of drinks per week and how often a drinker exceeds daily limits.  Discussed cessation/primary prevention of drug use and availability of treatment for abuse.   Diet: Encouraged to adjust caloric intake to maintain  or achieve ideal body weight, to reduce intake of dietary saturated fat and total fat, to limit sodium intake by avoiding high  sodium foods and not adding table salt, and to maintain adequate dietary potassium and calcium preferably from fresh fruits, vegetables, and low-fat dairy products.    stressed the importance of regular exercise  Injury prevention: Discussed safety belts, safety helmets, smoke detector, smoking near bedding or upholstery.   Dental health: Discussed importance of regular tooth brushing, flossing, and dental visits.    NEXT PREVENTATIVE PHYSICAL DUE IN 1 YEAR. Return in about 6 months (around 11/21/2022).

## 2022-05-23 NOTE — Patient Instructions (Signed)
Preventative Services:  Health Risk Assessment and Personalized Prevention Plan: Done today Bone Mass Measurements: Ordered today Breast Cancer Screening: Up to date CVD Screening: Done today Cervical Cancer Screening: N/A Colon Cancer Screening: Up to date Depression Screening: Done today Diabetes Screening: Done today Glaucoma Screening: see your eye doctor Hepatitis B vaccine: N/A Hepatitis C screening: Up to date HIV Screening: Up to date Flu Vaccine: Declined  Lung cancer Screening: N/A Obesity Screening: Done today Pneumonia Vaccines (2): Declined STI Screening: N/A

## 2022-05-24 ENCOUNTER — Telehealth: Payer: Self-pay

## 2022-05-24 LAB — LIPID PANEL W/O CHOL/HDL RATIO
Cholesterol, Total: 142 mg/dL (ref 100–199)
HDL: 73 mg/dL (ref >39)
LDL Chol Calc (NIH): 58 mg/dL (ref 0–99)
Triglycerides: 48 mg/dL (ref 0–149)
VLDL Cholesterol Cal: 11 mg/dL (ref 5–40)

## 2022-05-24 LAB — CBC WITH DIFFERENTIAL/PLATELET
Basophils Absolute: 0 10*3/uL (ref 0.0–0.2)
Basos: 0 %
EOS (ABSOLUTE): 0.2 10*3/uL (ref 0.0–0.4)
Eos: 3 %
Hematocrit: 34.5 % (ref 34.0–46.6)
Hemoglobin: 11.3 g/dL (ref 11.1–15.9)
Immature Grans (Abs): 0 10*3/uL (ref 0.0–0.1)
Immature Granulocytes: 0 %
Lymphocytes Absolute: 2.7 10*3/uL (ref 0.7–3.1)
Lymphs: 39 %
MCH: 30.5 pg (ref 26.6–33.0)
MCHC: 32.8 g/dL (ref 31.5–35.7)
MCV: 93 fL (ref 79–97)
Monocytes Absolute: 0.6 10*3/uL (ref 0.1–0.9)
Monocytes: 8 %
Neutrophils Absolute: 3.5 10*3/uL (ref 1.4–7.0)
Neutrophils: 50 %
Platelets: 216 10*3/uL (ref 150–450)
RBC: 3.7 x10E6/uL — ABNORMAL LOW (ref 3.77–5.28)
RDW: 12.9 % (ref 11.7–15.4)
WBC: 7 10*3/uL (ref 3.4–10.8)

## 2022-05-24 LAB — COMPREHENSIVE METABOLIC PANEL
ALT: 19 IU/L (ref 0–32)
AST: 24 IU/L (ref 0–40)
Albumin/Globulin Ratio: 2.1 (ref 1.2–2.2)
Albumin: 4.7 g/dL (ref 3.9–4.9)
Alkaline Phosphatase: 53 IU/L (ref 44–121)
BUN/Creatinine Ratio: 27 (ref 12–28)
BUN: 20 mg/dL (ref 8–27)
Bilirubin Total: 0.2 mg/dL (ref 0.0–1.2)
CO2: 23 mmol/L (ref 20–29)
Calcium: 9.2 mg/dL (ref 8.7–10.3)
Chloride: 103 mmol/L (ref 96–106)
Creatinine, Ser: 0.74 mg/dL (ref 0.57–1.00)
Globulin, Total: 2.2 g/dL (ref 1.5–4.5)
Glucose: 79 mg/dL (ref 70–99)
Potassium: 4 mmol/L (ref 3.5–5.2)
Sodium: 141 mmol/L (ref 134–144)
Total Protein: 6.9 g/dL (ref 6.0–8.5)
eGFR: 88 mL/min/{1.73_m2} (ref >59)

## 2022-05-24 LAB — TSH: TSH: 3.68 u[IU]/mL (ref 0.450–4.500)

## 2022-05-24 NOTE — Telephone Encounter (Signed)
Patient is scheduled for DEXA on 08/01/22 at 10:20 am.

## 2022-05-24 NOTE — Telephone Encounter (Signed)
-----   Message from Valerie Roys, DO sent at 05/23/2022  4:19 PM EST ----- Please schedule DEXA.

## 2022-06-01 ENCOUNTER — Encounter: Payer: Self-pay | Admitting: Family Medicine

## 2022-06-01 DIAGNOSIS — Z0100 Encounter for examination of eyes and vision without abnormal findings: Secondary | ICD-10-CM | POA: Diagnosis not present

## 2022-06-01 NOTE — Assessment & Plan Note (Signed)
Under good control on current regimen. Continue current regimen. Continue to monitor. Call with any concerns. Refills given. Labs drawn today.   

## 2022-06-01 NOTE — Assessment & Plan Note (Signed)
Not on treatment. Checking UA today. Await results.

## 2022-06-01 NOTE — Assessment & Plan Note (Signed)
Due for repeat DEXA. Ordered today. Await results.

## 2022-06-01 NOTE — Assessment & Plan Note (Signed)
Under good control on current regimen. Continue current regimen. Continue to monitor. Call with any concerns. Refills given for 6 months. Follow up 6 months.

## 2022-07-30 ENCOUNTER — Other Ambulatory Visit: Payer: Self-pay | Admitting: Family Medicine

## 2022-07-31 NOTE — Telephone Encounter (Signed)
Requested Prescriptions  Pending Prescriptions Disp Refills   alendronate (FOSAMAX) 70 MG tablet [Pharmacy Med Name: ALENDRONATE SODIUM 70 MG TAB] 12 tablet 0    Sig: TAKE 1 TABLET BY MOUTH EVERY 7 (SEVEN) DAYS. TAKE WITH A FULL GLASS OF WATER ON AN EMPTY STOMACH.     Endocrinology:  Bisphosphonates Failed - 07/30/2022  8:24 AM      Failed - Vitamin D in normal range and within 360 days    No results found for: "GE3662HU7", "ML4650PT4", "VD125OH2TOT", "25OHVITD3", "25OHVITD2", "25OHVITD1", "VD25OH"       Failed - Mg Level in normal range and within 360 days    No results found for: "MG"       Failed - Phosphate in normal range and within 360 days    No results found for: "PHOS"       Failed - Bone Mineral Density or Dexa Scan completed in the last 2 years      Passed - Ca in normal range and within 360 days    Calcium  Date Value Ref Range Status  05/23/2022 9.2 8.7 - 10.3 mg/dL Final         Passed - Cr in normal range and within 360 days    Creatinine, Ser  Date Value Ref Range Status  05/23/2022 0.74 0.57 - 1.00 mg/dL Final         Passed - eGFR is 30 or above and within 360 days    GFR calc Af Amer  Date Value Ref Range Status  05/10/2020 106 >59 mL/min/1.73 Final    Comment:    **In accordance with recommendations from the NKF-ASN Task force,**   [facility] is in the process of updating its eGFR calculation to the   2021 CKD-EPI creatinine equation that estimates kidney function   without a race variable.    GFR calc non Af Amer  Date Value Ref Range Status  05/10/2020 92 >59 mL/min/1.73 Final   eGFR  Date Value Ref Range Status  05/23/2022 88 >59 mL/min/1.73 Final         Passed - Valid encounter within last 12 months    Recent Outpatient Visits           2 months ago Encounter for [employer] annual wellness exam   [place], [place], DO   8 months ago Hypercholesterolemia   [place], [place], DO   1 year ago Routine general medical examination at a health care facility   [place], Barb Merino, DO   1 year ago Encounter for Medicare annual wellness exam   [place], DO   1 year ago Carpal tunnel syndrome of left wrist   [place], DO       Future Appointments             In 3 months Wynetta Emery, Barb Merino, DO [place], [facility]

## 2022-08-01 ENCOUNTER — Ambulatory Visit
Admission: RE | Admit: 2022-08-01 | Discharge: 2022-08-01 | Disposition: A | Discharge status: Home / Self Care | Payer: Medicare HMO | Source: Ambulatory Visit | Attending: Family Medicine | Admitting: Family Medicine

## 2022-08-01 DIAGNOSIS — M85851 Other specified disorders of bone density and structure, right thigh: Secondary | ICD-10-CM | POA: Diagnosis not present

## 2022-08-01 DIAGNOSIS — Z780 Asymptomatic menopausal state: Secondary | ICD-10-CM | POA: Diagnosis not present

## 2022-08-01 DIAGNOSIS — M816 Localized osteoporosis [Lequesne]: Secondary | ICD-10-CM | POA: Insufficient documentation

## 2022-08-21 DIAGNOSIS — D2271 Melanocytic nevi of right lower limb, including hip: Secondary | ICD-10-CM | POA: Diagnosis not present

## 2022-08-21 DIAGNOSIS — D2272 Melanocytic nevi of left lower limb, including hip: Secondary | ICD-10-CM | POA: Diagnosis not present

## 2022-08-21 DIAGNOSIS — L570 Actinic keratosis: Secondary | ICD-10-CM | POA: Diagnosis not present

## 2022-08-21 DIAGNOSIS — D2262 Melanocytic nevi of left upper limb, including shoulder: Secondary | ICD-10-CM | POA: Diagnosis not present

## 2022-08-21 DIAGNOSIS — D225 Melanocytic nevi of trunk: Secondary | ICD-10-CM | POA: Diagnosis not present

## 2022-08-21 DIAGNOSIS — L821 Other seborrheic keratosis: Secondary | ICD-10-CM | POA: Diagnosis not present

## 2022-08-21 DIAGNOSIS — L814 Other melanin hyperpigmentation: Secondary | ICD-10-CM | POA: Diagnosis not present

## 2022-08-21 DIAGNOSIS — D2261 Melanocytic nevi of right upper limb, including shoulder: Secondary | ICD-10-CM | POA: Diagnosis not present

## 2022-10-24 ENCOUNTER — Other Ambulatory Visit: Payer: Self-pay | Admitting: Family Medicine

## 2022-10-24 NOTE — Telephone Encounter (Signed)
Requested Prescriptions  Pending Prescriptions Disp Refills   alendronate (FOSAMAX) 70 MG tablet [Pharmacy Med Name: ALENDRONATE SODIUM 70 MG TAB] 12 tablet 0    Sig: TAKE 1 TABLET BY MOUTH EVERY 7 (SEVEN) DAYS. TAKE WITH A FULL GLASS OF WATER ON AN EMPTY STOMACH.     Endocrinology:  Bisphosphonates Failed - 10/24/2022  1:46 AM      Failed - Vitamin D in normal range and within 360 days    No results found for: "WU9811BJ4", "NW2956OZ3", "VD125OH2TOT", "25OHVITD3", "25OHVITD2", "25OHVITD1", "VD25OH"       Failed - Mg Level in normal range and within 360 days    No results found for: "MG"       Failed - Phosphate in normal range and within 360 days    No results found for: "PHOS"       Passed - Ca in normal range and within 360 days    Calcium  Date Value Ref Range Status  05/23/2022 9.2 8.7 - 10.3 mg/dL Final         Passed - Cr in normal range and within 360 days    Creatinine, Ser  Date Value Ref Range Status  05/23/2022 0.74 0.57 - 1.00 mg/dL Final         Passed - eGFR is 30 or above and within 360 days    GFR calc Af Amer  Date Value Ref Range Status  05/10/2020 106 >59 mL/min/1.73 Final    Comment:    **In accordance with recommendations from the NKF-ASN Task force,**   [facility] is in the process of updating its eGFR calculation to the   2021 CKD-EPI creatinine equation that estimates kidney function   without a race variable.    GFR calc non Af Amer  Date Value Ref Range Status  05/10/2020 92 >59 mL/min/1.73 Final   eGFR  Date Value Ref Range Status  05/23/2022 88 >59 mL/min/1.73 Final         Passed - Valid encounter within last 12 months    Recent Outpatient Visits           5 months ago Encounter for [employer] annual wellness exam   [facility] [place], Megan P, DO   11 months ago Hypercholesterolemia   [place] [facility] [place], [place] P, DO   1 year ago Routine general medical examination at a health care  facility   [facility] [place], [place] P, DO   1 year ago Encounter for Medicare annual wellness exam   [facility] [place], [place] P, DO   1 year ago Carpal tunnel syndrome of left wrist   [facility] Dorcas Carrow, DO       Future Appointments             In 4 weeks Dorcas Carrow, DO [place] [facility]            Passed - Bone Mineral Density or Dexa Scan completed in the last 2 years

## 2022-11-21 ENCOUNTER — Encounter: Payer: Self-pay | Admitting: Family Medicine

## 2022-11-21 ENCOUNTER — Ambulatory Visit (INDEPENDENT_AMBULATORY_CARE_PROVIDER_SITE_OTHER): Payer: Medicare HMO | Admitting: Family Medicine

## 2022-11-21 VITALS — BP 114/69 | HR 78 | Temp 98.7°F | Ht 63.2 in | Wt 132.2 lb

## 2022-11-21 DIAGNOSIS — F5101 Primary insomnia: Secondary | ICD-10-CM | POA: Diagnosis not present

## 2022-11-21 DIAGNOSIS — E7800 Pure hypercholesterolemia, unspecified: Secondary | ICD-10-CM | POA: Diagnosis not present

## 2022-11-21 DIAGNOSIS — F3342 Major depressive disorder, recurrent, in full remission: Secondary | ICD-10-CM | POA: Diagnosis not present

## 2022-11-21 MED ORDER — ATORVASTATIN CALCIUM 40 MG PO TABS: 40.0000 mg | ORAL_TABLET | Freq: Every day | ORAL | 1 refills | Status: DC

## 2022-11-21 MED ORDER — GABAPENTIN 300 MG PO CAPS: 600.0000 mg | ORAL_CAPSULE | Freq: Two times a day (BID) | ORAL | 3 refills | Status: DC

## 2022-11-21 MED ORDER — CITALOPRAM HYDROBROMIDE 40 MG PO TABS: 40.0000 mg | ORAL_TABLET | Freq: Every day | ORAL | 1 refills | Status: DC

## 2022-11-21 MED ORDER — ALENDRONATE SODIUM 70 MG PO TABS: ORAL_TABLET | ORAL | 4 refills | Status: DC

## 2022-11-21 NOTE — Progress Notes (Signed)
BP 114/69   Pulse 78   Temp 98.7 F (37.1 C) (Oral)   Ht 5' 3.2" (1.605 m)   Wt 132 lb 3.2 oz (60 kg)   SpO2 97%   BMI 23.27 kg/m    Subjective:    Patient ID: Kathryn Conway, female    DOB: 12-08-1950, 72 y.o.   MRN: [id number]  HPI: Kathryn Conway is a 72 y.o. female  Chief Complaint  Patient presents with   Hyperlipidemia   Depression   Insomnia        HYPERLIPIDEMIA Hyperlipidemia status: excellent compliance Satisfied with current treatment?  yes Side effects:  no Medication compliance: excellent compliance Past cholesterol meds: atorvastatin Supplements: none Aspirin:  no The 10-year ASCVD risk score (Arnett DK, et al., 2019) is: 5.7%   Values used to calculate the score:     Age: 89 years     Sex: Female     Is Non-Hispanic African American: No     Diabetic: No     Tobacco smoker: No     Systolic Blood Pressure: 114 mmHg     Is BP treated: No     HDL Cholesterol: 73 mg/dL     Total Cholesterol: 142 mg/dL Chest pain:  no Coronary artery disease:  no  DEPRESSION Mood status: controlled Satisfied with current treatment?: yes Symptom severity: mild  Duration of current treatment : chronic Side effects: no Medication compliance: excellent compliance Psychotherapy/counseling: no  Previous psychiatric medications: celexa Depressed mood: no Anxious mood: no Anhedonia: no Significant weight loss or gain: no Insomnia: yes  Fatigue: no Feelings of worthlessness or guilt: no Impaired concentration/indecisiveness: no Suicidal ideations: no Hopelessness: no Crying spells: no    11/21/2022    4:09 PM 05/24/2022   10:47 AM 11/14/2021    2:09 PM 05/16/2021    3:06 PM 05/11/2021    9:56 AM  Depression screen PHQ 2/9  Decreased Interest 0 0 0 0 0  Down, Depressed, Hopeless 0 0 0 0 0  PHQ - 2 Score 0 0 0 0 0  Altered sleeping 2 3 2  0   Tired, decreased energy 1 2 1  0   Change in appetite 0 0 0 0   Feeling bad or failure about yourself  0 0 0  0   Trouble concentrating 0 0 0 0   Moving slowly or fidgety/restless 0 0 0 0   Suicidal thoughts 0 0 0 0   PHQ-9 Score 3 5 3  0   Difficult doing work/chores Not difficult at all Not difficult at all  Not difficult at all     INSOMNIA Duration: chronic Satisfied with sleep quality: yes Difficulty falling asleep: no Difficulty staying asleep: no Waking a few hours after sleep onset: no Early morning awakenings: no Daytime hypersomnolence: no Wakes feeling refreshed: no Good sleep hygiene: no Apnea: no Snoring: no Depressed/anxious mood: no Recent stress: no Restless legs/nocturnal leg cramps: no Chronic pain/arthritis: yes History of sleep study: no Treatments attempted: melatonin, uinsom, benadryl, and ambien   Relevant past medical, surgical, family and social history reviewed and updated as indicated. Interim medical history since our last visit reviewed. Allergies and medications reviewed and updated.  Review of Systems  Constitutional: Negative.   Respiratory: Negative.    Cardiovascular: Negative.   Gastrointestinal: Negative.   Musculoskeletal:  Positive for myalgias. Negative for arthralgias, back pain, gait problem, joint swelling, neck pain and neck stiffness.  Psychiatric/Behavioral: Negative.      Per HPI  unless specifically indicated above     Objective:    BP 114/69   Pulse 78   Temp 98.7 F (37.1 C) (Oral)   Ht 5' 3.2" (1.605 m)   Wt 132 lb 3.2 oz (60 kg)   SpO2 97%   BMI 23.27 kg/m   Wt Readings from Last 3 Encounters:  11/21/22 132 lb 3.2 oz (60 kg)  05/23/22 136 lb 8 oz (61.9 kg)  11/14/21 127 lb (57.6 kg)    Physical Exam Vitals and nursing note reviewed.  Constitutional:      General: She is not in acute distress.    Appearance: Normal appearance. She is not ill-appearing, toxic-appearing or diaphoretic.  HENT:     Head: Normocephalic and atraumatic.     Right Ear: External ear normal.     Left Ear: External ear normal.     Nose:  Nose normal.     Mouth/Throat:     Mouth: Mucous membranes are moist.     Pharynx: Oropharynx is clear.  Eyes:     General: No scleral icterus.       Right eye: No discharge.        Left eye: No discharge.     Extraocular Movements: Extraocular movements intact.     Conjunctiva/sclera: Conjunctivae normal.     Pupils: Pupils are equal, round, and reactive to light.  Cardiovascular:     Rate and Rhythm: Normal rate and regular rhythm.     Pulses: Normal pulses.     Heart sounds: Normal heart sounds. No murmur heard.    No friction rub. No gallop.  Pulmonary:     Effort: Pulmonary effort is normal. No respiratory distress.     Breath sounds: Normal breath sounds. No stridor. No wheezing, rhonchi or rales.  Chest:     Chest wall: No tenderness.  Musculoskeletal:        General: Normal range of motion.     Cervical back: Normal range of motion and neck supple.  Skin:    General: Skin is warm and dry.     Capillary Refill: Capillary refill takes less than 2 seconds.     Coloration: Skin is not jaundiced or pale.     Findings: No bruising, erythema, lesion or rash.  Neurological:     General: No focal deficit present.     Mental Status: She is alert and oriented to person, place, and time. Mental status is at baseline.  Psychiatric:        Mood and Affect: Mood normal.        Behavior: Behavior normal.        Thought Content: Thought content normal.        Judgment: Judgment normal.     Results for orders placed or performed in visit on 05/23/22  Microscopic Examination   Urine  Result Value Ref Range   WBC, UA 0-5 0 - 5 /hpf   RBC, Urine 0-2 0 - 2 /hpf   Epithelial Cells (non renal) 0-10 0 - 10 /hpf   Bacteria, UA None seen None seen/Few  CBC with Differential/Platelet  Result Value Ref Range   WBC 7.0 3.4 - 10.8 x10E3/uL   RBC 3.70 (L) 3.77 - 5.28 x10E6/uL   Hemoglobin 11.3 11.1 - 15.9 g/dL   Hematocrit 82.9 56.2 - 46.6 %   MCV 93 79 - 97 fL   MCH 30.5 26.6 - 33.0  pg   MCHC 32.8 31.5 - 35.7 g/dL   RDW  12.9 11.7 - 15.4 %   Platelets 216 150 - 450 x10E3/uL   Neutrophils 50 Not Estab. %   Lymphs 39 Not Estab. %   Monocytes 8 Not Estab. %   Eos 3 Not Estab. %   Basos 0 Not Estab. %   Neutrophils Absolute 3.5 1.4 - 7.0 x10E3/uL   Lymphocytes Absolute 2.7 0.7 - 3.1 x10E3/uL   Monocytes Absolute 0.6 0.1 - 0.9 x10E3/uL   EOS (ABSOLUTE) 0.2 0.0 - 0.4 x10E3/uL   Basophils Absolute 0.0 0.0 - 0.2 x10E3/uL   Immature Granulocytes 0 Not Estab. %   Immature Grans (Abs) 0.0 0.0 - 0.1 x10E3/uL  Comprehensive metabolic panel  Result Value Ref Range   Glucose 79 70 - 99 mg/dL   BUN 20 8 - 27 mg/dL   Creatinine, Ser 8.29 0.57 - 1.00 mg/dL   eGFR 88 >56 OZ/HYQ/6.57   BUN/Creatinine Ratio 27 12 - 28   Sodium 141 134 - 144 mmol/L   Potassium 4.0 3.5 - 5.2 mmol/L   Chloride 103 96 - 106 mmol/L   CO2 23 20 - 29 mmol/L   Calcium 9.2 8.7 - 10.3 mg/dL   Total Protein 6.9 6.0 - 8.5 g/dL   Albumin 4.7 3.9 - 4.9 g/dL   Globulin, Total 2.2 1.5 - 4.5 g/dL   Albumin/Globulin Ratio 2.1 1.2 - 2.2   Bilirubin Total 0.2 0.0 - 1.2 mg/dL   Alkaline Phosphatase 53 44 - 121 IU/L   AST 24 0 - 40 IU/L   ALT 19 0 - 32 IU/L  Lipid Panel w/o Chol/HDL Ratio  Result Value Ref Range   Cholesterol, Total 142 100 - 199 mg/dL   Triglycerides 48 0 - 149 mg/dL   HDL 73 >84 mg/dL   VLDL Cholesterol Cal 11 5 - 40 mg/dL   LDL Chol Calc (NIH) 58 0 - 99 mg/dL  Urinalysis, Routine w reflex microscopic  Result Value Ref Range   Specific Gravity, UA 1.025 1.005 - 1.030   pH, UA 5.5 5.0 - 7.5   Color, UA Yellow Yellow   Appearance Ur Clear Clear   Leukocytes,UA 1+ (A) Negative   Protein,UA Negative Negative/Trace   Glucose, UA Negative Negative   Ketones, UA Negative Negative   RBC, UA Trace (A) Negative   Bilirubin, UA Negative Negative   Urobilinogen, Ur 0.2 0.2 - 1.0 mg/dL   Nitrite, UA Negative Negative   Microscopic Examination See below:   TSH  Result Value Ref Range   TSH  3.680 0.450 - 4.500 uIU/mL      Assessment & Plan:   Problem List Items Addressed This Visit       Other   Hypercholesterolemia - Primary    Under good control on current regimen. Continue current regimen. Continue to monitor. Call with any concerns. Refills given. Labs drawn today.        Relevant Medications   atorvastatin (LIPITOR) 40 MG tablet   Other Relevant Orders   Comprehensive metabolic panel   Lipid Panel w/o Chol/HDL Ratio   Insomnia    Under good control on current regimen. Continue current regimen. Continue to monitor. Call with any concerns. Refills given for 6 months. Follow up 6 months.        Recurrent major depressive disorder, in full remission (HCC)    Under good control on current regimen. Continue current regimen. Continue to monitor. Call with any concerns. Refills given. Labs drawn today.       Relevant Medications  citalopram (CELEXA) 40 MG tablet     Follow up plan: Return in about 6 months (around 05/24/2023) for physical.

## 2022-11-21 NOTE — Assessment & Plan Note (Signed)
Under good control on current regimen. Continue current regimen. Continue to monitor. Call with any concerns. Refills given. Labs drawn today.   

## 2022-11-21 NOTE — Assessment & Plan Note (Signed)
Under good control on current regimen. Continue current regimen. Continue to monitor. Call with any concerns. Refills given for 6 months. Follow up 6 months. 

## 2022-11-22 LAB — COMPREHENSIVE METABOLIC PANEL
ALT: 16 IU/L (ref 0–32)
AST: 19 IU/L (ref 0–40)
Albumin/Globulin Ratio: 2.1 (ref 1.2–2.2)
Albumin: 4.5 g/dL (ref 3.9–4.9)
Alkaline Phosphatase: 54 IU/L (ref 44–121)
BUN/Creatinine Ratio: 27 (ref 12–28)
BUN: 26 mg/dL (ref 8–27)
Bilirubin Total: 0.2 mg/dL (ref 0.0–1.2)
CO2: 25 mmol/L (ref 20–29)
Calcium: 9.3 mg/dL (ref 8.7–10.3)
Chloride: 105 mmol/L (ref 96–106)
Creatinine, Ser: 0.97 mg/dL (ref 0.57–1.00)
Globulin, Total: 2.1 g/dL (ref 1.5–4.5)
Glucose: 81 mg/dL (ref 70–99)
Potassium: 4.6 mmol/L (ref 3.5–5.2)
Sodium: 145 mmol/L — ABNORMAL HIGH (ref 134–144)
Total Protein: 6.6 g/dL (ref 6.0–8.5)
eGFR: 63 mL/min/{1.73_m2} (ref >59)

## 2022-11-22 LAB — LIPID PANEL W/O CHOL/HDL RATIO
Cholesterol, Total: 153 mg/dL (ref 100–199)
HDL: 73 mg/dL (ref >39)
LDL Chol Calc (NIH): 67 mg/dL (ref 0–99)
Triglycerides: 65 mg/dL (ref 0–149)
VLDL Cholesterol Cal: 13 mg/dL (ref 5–40)

## 2022-12-03 ENCOUNTER — Other Ambulatory Visit: Payer: Self-pay | Admitting: Family Medicine

## 2022-12-04 NOTE — Telephone Encounter (Signed)
Requested medication (s) are due for refill today: yes  Requested medication (s) are on the active medication list: yes  Last refill:  05/23/22 #30/2  Future visit scheduled: yes  Notes to clinic:  Unable to refill per protocol, cannot delegate.    Requested Prescriptions  Pending Prescriptions Disp Refills   zolpidem (AMBIEN) 10 MG tablet [Pharmacy Med Name: ZOLPIDEM TARTRATE 10 MG TABLET] 15 tablet     Sig: Take 0.5 tablets (5 mg total) by mouth at bedtime as needed.     Not Delegated - Psychiatry:  Anxiolytics/Hypnotics Failed - 12/03/2022  2:26 PM      Failed - This refill cannot be delegated      Failed - Urine Drug Screen completed in last 360 days      Passed - Valid encounter within last 6 months    Recent Outpatient Visits           1 week ago Hypercholesterolemia   [place] [facility] [place], Megan P, DO   6 months ago Encounter for [employer] annual wellness exam   [place] [place], [place] P, DO   1 year ago Hypercholesterolemia   [place] [place], Megan P, DO   1 year ago Routine general medical examination at a health care facility   [facility] [place], [place] P, DO   1 year ago Encounter for [employer] annual wellness exam   [place] [facility] Dorcas Carrow, DO       Future Appointments             In 5 months Laural Benes, Oralia Rud, DO [place] [facility]

## 2023-05-11 ENCOUNTER — Other Ambulatory Visit: Payer: Self-pay | Admitting: Family Medicine

## 2023-05-11 DIAGNOSIS — Z1231 Encounter for screening mammogram for malignant neoplasm of breast: Secondary | ICD-10-CM

## 2023-05-28 ENCOUNTER — Encounter: Payer: Medicare HMO | Admitting: Family Medicine

## 2023-06-07 ENCOUNTER — Ambulatory Visit
Admission: RE | Admit: 2023-06-07 | Discharge: 2023-06-07 | Disposition: A | Discharge status: Home / Self Care | Payer: Medicare HMO | Source: Ambulatory Visit | Attending: Family Medicine | Admitting: Family Medicine

## 2023-06-07 ENCOUNTER — Ambulatory Visit: Payer: Medicare HMO | Admitting: Emergency Medicine

## 2023-06-07 VITALS — Ht 63.0 in | Wt 136.0 lb

## 2023-06-07 DIAGNOSIS — Z1231 Encounter for screening mammogram for malignant neoplasm of breast: Secondary | ICD-10-CM | POA: Insufficient documentation

## 2023-06-07 DIAGNOSIS — Z0000 Encounter for general adult medical examination without abnormal findings: Secondary | ICD-10-CM

## 2023-06-07 NOTE — Progress Notes (Signed)
Subjective:   Kathryn Conway is a 72 y.o. female who presents for Medicare Annual (Subsequent) preventive examination.  Visit Complete: Virtual I connected with  [name] on 06/07/23 by a audio enabled telemedicine application and verified that I am speaking with the correct person using two identifiers.  Patient Location: Home  Provider Location: Home Office  I discussed the limitations of evaluation and management by telemedicine. The patient expressed understanding and agreed to proceed.  Vital Signs: Because this visit was a virtual/telehealth visit, some criteria may be missing or patient reported. Any vitals not documented were not able to be obtained and vitals that have been documented are patient reported.   Cardiac Risk Factors include: advanced age (>69men, >37 women);dyslipidemia     Objective:    Today's Vitals   06/07/23 1025 06/07/23 1026  Weight: 136 lb (61.7 kg)   Height: 5\' 3"  (1.6 m)   PainSc:  5    Body mass index is 24.09 kg/m.     06/07/2023   10:36 AM 05/11/2021    9:46 AM 05/10/2020    1:01 PM 04/26/2018    8:58 AM  Advanced Directives  Does Patient Have a Medical Advance Directive? No No No No  Would patient like information on creating a medical advance directive? Yes (MAU/Ambulatory/Procedural Areas - Information given) No - Patient declined  No - Patient declined    Current Medications (verified) Outpatient Encounter Medications as of 06/07/2023  Medication Sig   alendronate (FOSAMAX) 70 MG tablet TAKE 1 TABLET BY MOUTH EVERY 7 (SEVEN) DAYS. TAKE WITH A FULL GLASS OF WATER ON AN EMPTY STOMACH.   atorvastatin (LIPITOR) 40 MG tablet Take 1 tablet (40 mg total) by mouth daily.   citalopram (CELEXA) 40 MG tablet Take 1 tablet (40 mg total) by mouth daily.   gabapentin (NEURONTIN) 300 MG capsule Take 2 capsules (600 mg total) by mouth 2 (two) times daily.   loratadine (CLARITIN) 10 MG tablet Take 10 mg by mouth daily.   MEGARED  OMEGA-3 KRILL OIL PO Take by mouth daily.   meloxicam (MOBIC) 15 MG tablet Take 15 mg by mouth 3 (three) times daily.   zolpidem (AMBIEN) 10 MG tablet TAKE 0.5 TABLETS (5 MG TOTAL) BY MOUTH AT BEDTIME AS NEEDED.   No facility-administered encounter medications on file as of 06/07/2023.    Allergies (verified) Sulfa antibiotics   History: Past Medical History:  Diagnosis Date   Depression    Fibromyalgia    Hyperlipidemia    Insomnia    PONV (postoperative nausea and vomiting)    TMJ (temporomandibular joint disorder)    Uterine fibroid    Wears contact lenses    Wears dentures    full upper, partial lower   Past Surgical History:  Procedure Laterality Date   COLONOSCOPY  05/20/2012   repeat 5 years   COLONOSCOPY WITH PROPOFOL N/A 04/26/2018   Procedure: COLONOSCOPY WITH PROPOFOL;  Surgeon: Midge Minium, MD;  Location: [facility];  Service: Endoscopy;  Laterality: N/A;   laparoscopic gyn procedure     done before her hysterectomy   OOPHORECTOMY Left    TOTAL ABDOMINAL HYSTERECTOMY     Family History  Problem Relation Age of Onset   Heart disease Mother    Hypertension Mother    Depression Mother    Osteoporosis Mother    Colon polyps Mother    Cirrhosis Mother        non alcoholic   Liver disease Mother  fatty liver disease   Heart disease Father    Hypertension Father    Colon polyps Father    Stroke Father    Heart disease Sister    Hypertension Sister    Colon polyps Sister    Diabetes Sister    Hypertension Daughter    Colon polyps Daughter    Heart attack Brother    Hypertension Brother    Heart attack Brother    Hyperlipidemia Son    Breast cancer Neg Hx    Social History   Socioeconomic History   Marital status: Married    Spouse name: Ozzie   Number of children: 2   Years of education: Not on file   Highest education level: Not on file  Occupational History   Occupation: part time   Tobacco Use   Smoking status: Never    Smokeless tobacco: Never  Vaping Use   Vaping status: Never Used  Substance and Sexual Activity   Alcohol use: Yes    Alcohol/week: 3.0 standard drinks of alcohol    Types: 3 Glasses of wine per week    Comment: 1 glass of wine 3 times per week   Drug use: No   Sexual activity: Yes    Birth control/protection: Surgical    Comment: Hysterectomy  Other Topics Concern   Not on file  Social History Narrative   Not on file   Social Determinants of Health   Financial Resource Strain: Low Risk  (06/07/2023)   Overall Financial Resource Strain (CARDIA)    Difficulty of Paying Living Expenses: Not hard at all  Food Insecurity: No Food Insecurity (06/07/2023)   Hunger Vital Sign    Worried About Running Out of Food in the Last Year: Never true    Ran Out of Food in the Last Year: Never true  Transportation Needs: No Transportation Needs (06/07/2023)   PRAPARE - Administrator, Civil Service (Medical): No    Lack of Transportation (Non-Medical): No  Physical Activity: Inactive (06/07/2023)   Exercise Vital Sign    Days of Exercise per Week: 0 days    Minutes of Exercise per Session: 0 min  Stress: No Stress Concern Present (06/07/2023)   [employer] of Occupational Health - Occupational Stress Questionnaire    Feeling of Stress : Only a little  Social Connections: Socially Integrated (06/07/2023)   Social Connection and Isolation Panel [NHANES]    Frequency of Communication with Friends and Family: More than three times a week    Frequency of Social Gatherings with Friends and Family: More than three times a week    Attends Religious Services: More than 4 times per year    Active Member of [employer] or Organizations: Yes    Attends Engineer, structural: More than 4 times per year    Marital Status: Married    Tobacco Counseling Counseling given: Not Answered   Clinical Intake:  Pre-visit preparation completed: Yes  Pain : 0-10 Pain Score: 5  Pain Type:  Chronic pain Pain Location: Generalized Pain Descriptors / Indicators: Aching     BMI - recorded: 24.09 Nutritional Risks: None Diabetes: No  How often do you need to have someone help you when you read instructions, pamphlets, or other written materials from your doctor or pharmacy?: 1 - Never  Interpreter Needed?: No  Information entered by :: Tora Kindred, CMA   Activities of Daily Living    06/07/2023   10:27 AM  In your present state  of health, do you have any difficulty performing the following activities:  Hearing? 0  Vision? 0  Difficulty concentrating or making decisions? 0  Walking or climbing stairs? 0  Dressing or bathing? 0  Doing errands, shopping? 0  Preparing Food and eating ? N  Using the Toilet? N  In the past six months, have you accidently leaked urine? N  Do you have problems with loss of bowel control? N  Managing your Medications? N  Managing your Finances? N  Housekeeping or managing your Housekeeping? N    Patient Care Team: Dorcas Carrow, DO as PCP - General (Family Medicine)  Indicate any recent Medical Services you may have received from other than [facility] providers in the past year (date may be approximate).     Assessment:   This is a routine wellness examination for [place].  Hearing/Vision screen Hearing Screening - Comments:: Denies hearing loss Vision Screening - Comments:: Gets eye exams   Goals Addressed               This Visit's Progress     Patient Stated (pt-stated)        Exercise and lose 7-8 lbs      Depression Screen    06/07/2023   10:34 AM 11/21/2022    4:09 PM 05/24/2022   10:47 AM 11/14/2021    2:09 PM 05/16/2021    3:06 PM 05/11/2021    9:56 AM 11/08/2020    4:25 PM  PHQ 2/9 Scores  PHQ - 2 Score 0 0 0 0 0 0 0  PHQ- 9 Score 0 3 5 3  0  0    Fall Risk    06/07/2023   10:37 AM 11/21/2022    4:08 PM 05/16/2021    3:07 PM 05/11/2021    9:57 AM 05/10/2020    1:02 PM  Fall Risk   Falls in the past  year? 0 0 0 0 0  Number falls in past yr: 0 0 0 0   Injury with Fall? 0 0 0 0   Risk for fall due to : No Fall Risks No Fall Risks No Fall Risks  Medication side effect  Follow up Falls prevention discussed Falls evaluation completed Falls evaluation completed Falls evaluation completed;Education provided;Falls prevention discussed Falls evaluation completed;Education provided;Falls prevention discussed    MEDICARE RISK AT HOME: Medicare Risk at Home Any stairs in or around the home?: Yes (2 steps - no handrail) If so, are there any without handrails?: Yes (2 steps - no handrail) Home free of loose throw rugs in walkways, pet beds, electrical cords, etc?: Yes Adequate lighting in your home to reduce risk of falls?: Yes Life alert?: No Use of a cane, walker or w/c?: No Grab bars in the bathroom?: No Shower chair or bench in shower?: No Elevated toilet seat or a handicapped toilet?: Yes  TIMED UP AND GO:  Was the test performed?  No    Cognitive Function:        06/07/2023   10:39 AM 05/23/2022    4:15 PM 05/10/2020    1:09 PM 05/01/2019    3:24 PM 04/02/2018    4:07 PM  6CIT Screen  What Year? 0 points 0 points 0 points 0 points 0 points  What month? 0 points 0 points 0 points 0 points 0 points  What time? 0 points 0 points 0 points 0 points 0 points  Count back from 20 2 points 0 points 0 points 0 points 0  points  Months in reverse 0 points 0 points 0 points 0 points 0 points  Repeat phrase 0 points 2 points 2 points 0 points 0 points  Total Score 2 points 2 points 2 points 0 points 0 points    Immunizations Immunization History  Administered Date(s) Administered   Influenza-Unspecified 04/26/2016   Td 05/16/2021   Zoster Recombinant(Shingrix) 04/19/2018, 08/24/2018   Zoster, Live 05/11/2014    TDAP status: Up to date  Flu Vaccine status: Declined, Education has been provided regarding the importance of this vaccine but patient still declined. Advised may receive  this vaccine at local pharmacy or Health Dept. Aware to provide a copy of the vaccination record if obtained from local pharmacy or Health Dept. Verbalized acceptance and understanding.  Pneumococcal vaccine status: Declined,  Education has been provided regarding the importance of this vaccine but patient still declined. Advised may receive this vaccine at local pharmacy or Health Dept. Aware to provide a copy of the vaccination record if obtained from local pharmacy or Health Dept. Verbalized acceptance and understanding.   Covid-19 vaccine status: Declined, Education has been provided regarding the importance of this vaccine but patient still declined. Advised may receive this vaccine at local pharmacy or Health Dept.or vaccine clinic. Aware to provide a copy of the vaccination record if obtained from local pharmacy or Health Dept. Verbalized acceptance and understanding.  Qualifies for Shingles Vaccine? Yes   Zostavax completed Yes   Shingrix Completed?: Yes  Screening Tests Health Maintenance  Topic Date Due   Pneumonia Vaccine 47+ Years old (1 of 1 - PCV) Never done   INFLUENZA VACCINE  02/01/2023   COVID-19 Vaccine (1 - 2023-24 season) Never done   MAMMOGRAM  05/16/2024   Medicare Annual Wellness (AWV)  06/06/2024   DEXA SCAN  08/01/2025   Colonoscopy  04/26/2028   DTaP/Tdap/Td (2 - Tdap) 05/17/2031   Hepatitis C Screening  Completed   Zoster Vaccines- Shingrix  Completed   HPV VACCINES  Aged Out    Health Maintenance  Health Maintenance Due  Topic Date Due   Pneumonia Vaccine 65+ Years old (1 of 1 - PCV) Never done   INFLUENZA VACCINE  02/01/2023   COVID-19 Vaccine (1 - 2023-24 season) Never done    Colorectal cancer screening: Type of screening: Colonoscopy. Completed 04/26/18. Repeat every 10 years  Mammogram status: Completed 05/16/22. Repeat every year Scheduled 06/07/23  Bone Density status: Completed 08/01/22. Results reflect: Bone density results: OSTEOPENIA.  Repeat every 5 years.  Lung Cancer Screening: (Low Dose CT Chest recommended if Age 44-80 years, 20 pack-year currently smoking OR have quit w/in 15years.) does not qualify.   Lung Cancer Screening Referral: n/a  Additional Screening:  Hepatitis C Screening: does not qualify; Completed 04/02/18  Vision Screening: Recommended annual ophthalmology exams for early detection of glaucoma and other disorders of the eye.  Dental Screening: Recommended annual dental exams for proper oral hygiene  Community Resource Referral / Chronic Care Management: CRR required this visit?  No   CCM required this visit?  No     Plan:     I have personally reviewed and noted the following in the patient's chart:   Medical and social history Use of alcohol, tobacco or illicit drugs  Current medications and supplements including opioid prescriptions. Patient is not currently taking opioid prescriptions. Functional ability and status Nutritional status Physical activity Advanced directives List of other physicians Hospitalizations, surgeries, and ER visits in previous 12 months Vitals Screenings to include  cognitive, depression, and falls Referrals and appointments  In addition, I have reviewed and discussed with patient certain preventive protocols, quality metrics, and best practice recommendations. A written personalized care plan for preventive services as well as general preventive health recommendations were provided to patient.     Tora Kindred, CMA   06/07/2023   After Visit Summary: (MyChart) Due to this being a telephonic visit, the after visit summary with patients personalized plan was offered to patient via MyChart   Nurse Notes:  6 CIT Score - 2 Declined flu, pneumonia and covid vaccines.

## 2023-06-07 NOTE — Patient Instructions (Addendum)
Kathryn Conway , Thank you for taking time to come for your Medicare Wellness Visit. I appreciate your ongoing commitment to your health goals. Please review the following plan we discussed and let me know if I can assist you in the future.   Referrals/Orders/Follow-Ups/Clinician Recommendations: Keep up the good work!  This is a list of the screening recommended for you and due dates:  Health Maintenance  Topic Date Due   Pneumonia Vaccine (1 of 1 - PCV) Never done   COVID-19 Vaccine (1 - 2023-24 season) Never done   Flu Shot  10/01/2023*   Mammogram  05/16/2024   Medicare Annual Wellness Visit  06/06/2024   DEXA scan (bone density measurement)  08/01/2025   Colon Cancer Screening  04/26/2028   DTaP/Tdap/Td vaccine (2 - Tdap) 05/17/2031   Hepatitis C Screening  Completed   Zoster (Shingles) Vaccine  Completed   HPV Vaccine  Aged Out  *Topic was postponed. The date shown is not the original due date.    Advanced directives: (ACP Link)Information on Advanced Care Planning can be found at [facility] Advance Health Care Directives Advance Health Care Directives ([email or web address]   Once you have completed the forms, please bring a copy of your health care power of attorney and living will to the office to be added to your chart at your convenience.   Next Medicare Annual Wellness Visit scheduled for next year: Yes, 06/19/24 @ 10:40am

## 2023-06-15 ENCOUNTER — Ambulatory Visit (INDEPENDENT_AMBULATORY_CARE_PROVIDER_SITE_OTHER): Payer: Medicare HMO | Admitting: Family Medicine

## 2023-06-15 ENCOUNTER — Encounter: Payer: Self-pay | Admitting: Family Medicine

## 2023-06-15 VITALS — BP 131/71 | HR 77 | Temp 97.7°F | Ht 63.2 in | Wt 138.4 lb

## 2023-06-15 DIAGNOSIS — F3342 Major depressive disorder, recurrent, in full remission: Secondary | ICD-10-CM

## 2023-06-15 DIAGNOSIS — E7800 Pure hypercholesterolemia, unspecified: Secondary | ICD-10-CM | POA: Diagnosis not present

## 2023-06-15 DIAGNOSIS — G4700 Insomnia, unspecified: Secondary | ICD-10-CM

## 2023-06-15 DIAGNOSIS — Z0000 Encounter for general adult medical examination without abnormal findings: Secondary | ICD-10-CM | POA: Diagnosis not present

## 2023-06-15 DIAGNOSIS — M816 Localized osteoporosis [Lequesne]: Secondary | ICD-10-CM | POA: Diagnosis not present

## 2023-06-15 MED ORDER — CITALOPRAM HYDROBROMIDE 40 MG PO TABS: 40.0000 mg | ORAL_TABLET | Freq: Every day | ORAL | 1 refills | Status: DC

## 2023-06-15 MED ORDER — ZOLPIDEM TARTRATE 10 MG PO TABS: 5.0000 mg | ORAL_TABLET | Freq: Every evening | ORAL | 0 refills | Status: DC | PRN

## 2023-06-15 MED ORDER — ALENDRONATE SODIUM 70 MG PO TABS: ORAL_TABLET | ORAL | 4 refills | Status: DC

## 2023-06-15 MED ORDER — ZOLPIDEM TARTRATE 10 MG PO TABS: 5.0000 mg | ORAL_TABLET | Freq: Every evening | ORAL | 4 refills | Status: DC | PRN

## 2023-06-15 MED ORDER — GABAPENTIN 300 MG PO CAPS: 600.0000 mg | ORAL_CAPSULE | Freq: Two times a day (BID) | ORAL | 1 refills | Status: DC

## 2023-06-15 MED ORDER — ATORVASTATIN CALCIUM 40 MG PO TABS: 40.0000 mg | ORAL_TABLET | Freq: Every day | ORAL | 1 refills | Status: DC

## 2023-06-15 NOTE — Assessment & Plan Note (Signed)
Under good control on current regimen. Continue current regimen. Continue to monitor. Call with any concerns. Refills given. Due for repeat DEXA in 2027.

## 2023-06-15 NOTE — Assessment & Plan Note (Signed)
Under good control on current regimen. Continue current regimen. Continue to monitor. Call with any concerns. Refills given. Labs drawn today.   

## 2023-06-15 NOTE — Assessment & Plan Note (Signed)
Under good control on current regimen. Continue current regimen. Continue to monitor. Call with any concerns. Refills given.   

## 2023-06-15 NOTE — Progress Notes (Signed)
BP 131/71   Pulse 77   Temp 97.7 F (36.5 C) (Oral)   Ht 5' 3.2" (1.605 m)   Wt 138 lb 6.4 oz (62.8 kg)   SpO2 97%   BMI 24.36 kg/m    Subjective:    Patient ID: Kathryn Conway, female    DOB: 1950-12-29, 72 y.o.   MRN: [id number]  HPI: Kathryn Conway is a 72 y.o. female presenting on 06/15/2023 for comprehensive medical examination. Current medical complaints include:  HYPERLIPIDEMIA Hyperlipidemia status: excellent compliance Satisfied with current treatment?  yes Side effects:  no Medication compliance: excellent compliance Past cholesterol meds: atorvastatin Supplements: fish oil Aspirin:  no The 10-year ASCVD risk score (Arnett DK, et al., 2019) is: 8.7%   Values used to calculate the score:     Age: 33 years     Sex: Female     Is Non-Hispanic African American: No     Diabetic: No     Tobacco smoker: No     Systolic Blood Pressure: 131 mmHg     Is BP treated: No     HDL Cholesterol: 73 mg/dL     Total Cholesterol: 153 mg/dL Chest pain:  no Coronary artery disease:  no  DEPRESSION Mood status: controlled Satisfied with current treatment?: yes Symptom severity: mild  Duration of current treatment : chronic Side effects: no Medication compliance: excellent compliance Psychotherapy/counseling: no  Previous psychiatric medications: celexa Depressed mood: no Anxious mood: no Anhedonia: no Significant weight loss or gain: no Insomnia: yes Fatigue: yes Feelings of worthlessness or guilt: no Impaired concentration/indecisiveness: no Suicidal ideations: no Hopelessness: no Crying spells: no    06/15/2023    1:45 PM 06/07/2023   10:34 AM 11/21/2022    4:09 PM 05/24/2022   10:47 AM 11/14/2021    2:09 PM  Depression screen PHQ 2/9  Decreased Interest 0 0 0 0 0  Down, Depressed, Hopeless 0 0 0 0 0  PHQ - 2 Score 0 0 0 0 0  Altered sleeping 2 0 2 3 2   Tired, decreased energy 2 0 1 2 1   Change in appetite 0 0 0 0 0  Feeling bad or failure about  yourself  0 0 0 0 0  Trouble concentrating 0 0 0 0 0  Moving slowly or fidgety/restless 0 0 0 0 0  Suicidal thoughts 0 0 0 0 0  PHQ-9 Score 4 0 3 5 3   Difficult doing work/chores Not difficult at all Not difficult at all Not difficult at all Not difficult at all    INSOMNIA Duration: chronic Satisfied with sleep quality: yes Difficulty falling asleep: no Difficulty staying asleep: no Waking a few hours after sleep onset: no Early morning awakenings: no Daytime hypersomnolence: no Wakes feeling refreshed: yes Good sleep hygiene: yes Apnea: no Snoring: no Depressed/anxious mood: no Recent stress: yes Restless legs/nocturnal leg cramps: no Chronic pain/arthritis: yes History of sleep study: no Treatments attempted: melatonin, uinsom, benadryl, and ambien  She currently lives with: husband Menopausal Symptoms: no  Depression Screen done today and results listed below:     06/15/2023    1:45 PM 06/07/2023   10:34 AM 11/21/2022    4:09 PM 05/24/2022   10:47 AM 11/14/2021    2:09 PM  Depression screen PHQ 2/9  Decreased Interest 0 0 0 0 0  Down, Depressed, Hopeless 0 0 0 0 0  PHQ - 2 Score 0 0 0 0 0  Altered sleeping 2 0 2 3 2  Tired, decreased energy 2 0 1 2 1   Change in appetite 0 0 0 0 0  Feeling bad or failure about yourself  0 0 0 0 0  Trouble concentrating 0 0 0 0 0  Moving slowly or fidgety/restless 0 0 0 0 0  Suicidal thoughts 0 0 0 0 0  PHQ-9 Score 4 0 3 5 3   Difficult doing work/chores Not difficult at all Not difficult at all Not difficult at all Not difficult at all      Past Medical History:  Past Medical History:  Diagnosis Date   Depression    Fibromyalgia    Hyperlipidemia    Insomnia    PONV (postoperative nausea and vomiting)    TMJ (temporomandibular joint disorder)    Uterine fibroid    Wears contact lenses    Wears dentures    full upper, partial lower    Surgical History:  Past Surgical History:  Procedure Laterality Date    COLONOSCOPY  05/20/2012   repeat 5 years   COLONOSCOPY WITH PROPOFOL N/A 04/26/2018   Procedure: COLONOSCOPY WITH PROPOFOL;  Surgeon: Midge Minium, MD;  Location: [facility];  Service: Endoscopy;  Laterality: N/A;   laparoscopic gyn procedure     done before her hysterectomy   OOPHORECTOMY Left    TOTAL ABDOMINAL HYSTERECTOMY      Medications:  Current Outpatient Medications on File Prior to Visit  Medication Sig   loratadine (CLARITIN) 10 MG tablet Take 10 mg by mouth daily.   MEGARED OMEGA-3 KRILL OIL PO Take by mouth daily.   meloxicam (MOBIC) 15 MG tablet Take 15 mg by mouth 3 (three) times daily.   No current facility-administered medications on file prior to visit.    Allergies:  Allergies  Allergen Reactions   Sulfa Antibiotics Rash    Social History:  Social History   Socioeconomic History   Marital status: Married    Spouse name: Ozzie   Number of children: 2   Years of education: Not on file   Highest education level: Not on file  Occupational History   Occupation: part time   Tobacco Use   Smoking status: Never   Smokeless tobacco: Never  Vaping Use   Vaping status: Never Used  Substance and Sexual Activity   Alcohol use: Yes    Alcohol/week: 3.0 standard drinks of alcohol    Types: 3 Glasses of wine per week    Comment: 1 glass of wine 3 times per week   Drug use: No   Sexual activity: Yes    Birth control/protection: Surgical    Comment: Hysterectomy  Other Topics Concern   Not on file  Social History Narrative   Still work part-time 8 hours/day, 3 days/wk at [employer]   Financial Resource Strain: Low Risk  (06/07/2023)   Overall Financial Resource Strain (CARDIA)    Difficulty of Paying Living Expenses: Not hard at all  Food Insecurity: No Food Insecurity (06/07/2023)   Hunger Vital Sign    Worried About Running Out of Food in the Last Year: Never true    Ran Out of Food in the Last Year: Never true   Transportation Needs: No Transportation Needs (06/07/2023)   PRAPARE - Administrator, Civil Service (Medical): No    Lack of Transportation (Non-Medical): No  Physical Activity: Inactive (06/07/2023)   Exercise Vital Sign    Days of Exercise per Week: 0 days  Minutes of Exercise per Session: 0 min  Stress: No Stress Concern Present (06/07/2023)   [employer] of Occupational Health - Occupational Stress Questionnaire    Feeling of Stress : Only a little  Social Connections: Socially Integrated (06/07/2023)   Social Connection and Isolation Panel [NHANES]    Frequency of Communication with Friends and Family: More than three times a week    Frequency of Social Gatherings with Friends and Family: More than three times a week    Attends Religious Services: More than 4 times per year    Active Member of [employer] or Organizations: Yes    Attends Engineer, structural: More than 4 times per year    Marital Status: Married  Catering manager Violence: Not At Risk (06/07/2023)   Humiliation, Afraid, Rape, and Kick questionnaire    Fear of Current or Ex-Partner: No    Emotionally Abused: No    Physically Abused: No    Sexually Abused: No   Social History   Tobacco Use  Smoking Status Never  Smokeless Tobacco Never   Social History   Substance and Sexual Activity  Alcohol Use Yes   Alcohol/week: 3.0 standard drinks of alcohol   Types: 3 Glasses of wine per week   Comment: 1 glass of wine 3 times per week    Family History:  Family History  Problem Relation Age of Onset   Heart disease Mother    Hypertension Mother    Depression Mother    Osteoporosis Mother    Colon polyps Mother    Cirrhosis Mother        non alcoholic   Liver disease Mother        fatty liver disease   Heart disease Father    Hypertension Father    Colon polyps Father    Stroke Father    Heart disease Sister    Hypertension Sister    Colon polyps Sister    Diabetes Sister     Hypertension Daughter    Colon polyps Daughter    Heart attack Brother    Hypertension Brother    Heart attack Brother    Hyperlipidemia Son    Breast cancer Neg Hx     Past medical history, surgical history, medications, allergies, family history and social history reviewed with patient today and changes made to appropriate areas of the chart.   Review of Systems  Constitutional: Negative.   HENT: Negative.    Eyes:  Positive for blurred vision. Negative for double vision, photophobia, pain, discharge and redness.  Respiratory: Negative.    Cardiovascular: Negative.   Gastrointestinal: Negative.   Genitourinary: Negative.   Musculoskeletal:  Positive for myalgias. Negative for back pain, falls, joint pain and neck pain.  Skin: Negative.   Neurological: Negative.   Endo/Heme/Allergies: Negative.   Psychiatric/Behavioral: Negative.     All other ROS negative except what is listed above and in the HPI.      Objective:    BP 131/71   Pulse 77   Temp 97.7 F (36.5 C) (Oral)   Ht 5' 3.2" (1.605 m)   Wt 138 lb 6.4 oz (62.8 kg)   SpO2 97%   BMI 24.36 kg/m   Wt Readings from Last 3 Encounters:  06/15/23 138 lb 6.4 oz (62.8 kg)  06/07/23 136 lb (61.7 kg)  11/21/22 132 lb 3.2 oz (60 kg)    Physical Exam Vitals and nursing note reviewed.  Constitutional:      General: She is  not in acute distress.    Appearance: Normal appearance. She is normal weight. She is not ill-appearing, toxic-appearing or diaphoretic.  HENT:     Head: Normocephalic and atraumatic.     Right Ear: Tympanic membrane, ear canal and external ear normal. There is no impacted cerumen.     Left Ear: Tympanic membrane, ear canal and external ear normal. There is no impacted cerumen.     Nose: Nose normal. No congestion or rhinorrhea.     Mouth/Throat:     Mouth: Mucous membranes are moist.     Pharynx: Oropharynx is clear. No oropharyngeal exudate or posterior oropharyngeal erythema.  Eyes:     General:  No scleral icterus.       Right eye: No discharge.        Left eye: No discharge.     Extraocular Movements: Extraocular movements intact.     Conjunctiva/sclera: Conjunctivae normal.     Pupils: Pupils are equal, round, and reactive to light.  Neck:     Vascular: No carotid bruit.  Cardiovascular:     Rate and Rhythm: Normal rate and regular rhythm.     Pulses: Normal pulses.     Heart sounds: No murmur heard.    No friction rub. No gallop.  Pulmonary:     Effort: Pulmonary effort is normal. No respiratory distress.     Breath sounds: Normal breath sounds. No stridor. No wheezing, rhonchi or rales.  Chest:     Chest wall: No tenderness.  Abdominal:     General: Abdomen is flat. Bowel sounds are normal. There is no distension.     Palpations: Abdomen is soft. There is no mass.     Tenderness: There is no abdominal tenderness. There is no right CVA tenderness, left CVA tenderness, guarding or rebound.     Hernia: No hernia is present.  Genitourinary:    Comments: Breast and pelvic exams deferred with shared decision making Musculoskeletal:        General: No swelling, tenderness, deformity or signs of injury.     Cervical back: Normal range of motion and neck supple. No rigidity. No muscular tenderness.     Right lower leg: No edema.     Left lower leg: No edema.  Lymphadenopathy:     Cervical: No cervical adenopathy.  Skin:    General: Skin is warm and dry.     Capillary Refill: Capillary refill takes less than 2 seconds.     Coloration: Skin is not jaundiced or pale.     Findings: No bruising, erythema, lesion or rash.  Neurological:     General: No focal deficit present.     Mental Status: She is alert and oriented to person, place, and time. Mental status is at baseline.     Cranial Nerves: No cranial nerve deficit.     Sensory: No sensory deficit.     Motor: No weakness.     Coordination: Coordination normal.     Gait: Gait normal.     Deep Tendon Reflexes: Reflexes  normal.  Psychiatric:        Mood and Affect: Mood normal.        Behavior: Behavior normal.        Thought Content: Thought content normal.        Judgment: Judgment normal.     Results for orders placed or performed in visit on 11/21/22  Comprehensive metabolic panel   Collection Time: 11/21/22  4:10 PM  Result Value Ref  Range   Glucose 81 70 - 99 mg/dL   BUN 26 8 - 27 mg/dL   Creatinine, Ser 4.09 0.57 - 1.00 mg/dL   eGFR 63 >81 XB/JYN/8.29   BUN/Creatinine Ratio 27 12 - 28   Sodium 145 (H) 134 - 144 mmol/L   Potassium 4.6 3.5 - 5.2 mmol/L   Chloride 105 96 - 106 mmol/L   CO2 25 20 - 29 mmol/L   Calcium 9.3 8.7 - 10.3 mg/dL   Total Protein 6.6 6.0 - 8.5 g/dL   Albumin 4.5 3.9 - 4.9 g/dL   Globulin, Total 2.1 1.5 - 4.5 g/dL   Albumin/Globulin Ratio 2.1 1.2 - 2.2   Bilirubin Total 0.2 0.0 - 1.2 mg/dL   Alkaline Phosphatase 54 44 - 121 IU/L   AST 19 0 - 40 IU/L   ALT 16 0 - 32 IU/L  Lipid Panel w/o Chol/HDL Ratio   Collection Time: 11/21/22  4:10 PM  Result Value Ref Range   Cholesterol, Total 153 100 - 199 mg/dL   Triglycerides 65 0 - 149 mg/dL   HDL 73 >56 mg/dL   VLDL Cholesterol Cal 13 5 - 40 mg/dL   LDL Chol Calc (NIH) 67 0 - 99 mg/dL      Assessment & Plan:   Problem List Items Addressed This Visit       Musculoskeletal and Integument   Osteoporosis   Under good control on current regimen. Continue current regimen. Continue to monitor. Call with any concerns. Refills given. Due for repeat DEXA in 2027.      Relevant Medications   alendronate (FOSAMAX) 70 MG tablet     Other   Hypercholesterolemia   Under good control on current regimen. Continue current regimen. Continue to monitor. Call with any concerns. Refills given. Labs drawn today.        Relevant Medications   atorvastatin (LIPITOR) 40 MG tablet   Other Relevant Orders   CBC with Differential/Platelet   Comprehensive metabolic panel   Lipid Panel w/o Chol/HDL Ratio   TSH   Insomnia    Under good control on current regimen. Continue current regimen. Continue to monitor. Call with any concerns. Refills given.      Recurrent major depressive disorder, in full remission (HCC)   Under good control on current regimen. Continue current regimen. Continue to monitor. Call with any concerns. Refills given.        Relevant Medications   citalopram (CELEXA) 40 MG tablet   Other Relevant Orders   CBC with Differential/Platelet   Comprehensive metabolic panel   TSH   Other Visit Diagnoses       Routine general medical examination at a health care facility    -  Primary   Vaccines up to date/declined. Screening labs checked today. Mammo, colonoscopy and DEXA up to date. Continue diet and exercise. Call with any concerns.        Follow up plan: Return in about 6 months (around 12/14/2023).   LABORATORY TESTING:  - Pap smear: not applicable  IMMUNIZATIONS:   - Tdap: Tetanus vaccination status reviewed: last tetanus booster within 10 years. - Influenza: Refused - Pneumovax: Refused - Prevnar: Refused - COVID: Refused - HPV: Not applicable - Shingrix vaccine: Up to date  SCREENING: -Mammogram: Up to date  - Colonoscopy: Up to date  - Bone Density: Up to date   PATIENT COUNSELING:   Advised to take 1 mg of folate supplement per day if capable of pregnancy.  Sexuality: Discussed sexually transmitted diseases, partner selection, use of condoms, avoidance of unintended pregnancy  and contraceptive alternatives.   Advised to avoid cigarette smoking.  I discussed with the patient that most people either abstain from alcohol or drink within safe limits (<=14/week and <=4 drinks/occasion for males, <=7/weeks and <= 3 drinks/occasion for females) and that the risk for alcohol disorders and other health effects rises proportionally with the number of drinks per week and how often a drinker exceeds daily limits.  Discussed cessation/primary prevention of drug use and  availability of treatment for abuse.   Diet: Encouraged to adjust caloric intake to maintain  or achieve ideal body weight, to reduce intake of dietary saturated fat and total fat, to limit sodium intake by avoiding high sodium foods and not adding table salt, and to maintain adequate dietary potassium and calcium preferably from fresh fruits, vegetables, and low-fat dairy products.    stressed the importance of regular exercise  Injury prevention: Discussed safety belts, safety helmets, smoke detector, smoking near bedding or upholstery.   Dental health: Discussed importance of regular tooth brushing, flossing, and dental visits.    NEXT PREVENTATIVE PHYSICAL DUE IN 1 YEAR. Return in about 6 months (around 12/14/2023).

## 2023-06-16 LAB — CBC WITH DIFFERENTIAL/PLATELET
Basophils Absolute: 0 10*3/uL (ref 0.0–0.2)
Basos: 0 %
EOS (ABSOLUTE): 0.1 10*3/uL (ref 0.0–0.4)
Eos: 2 %
Hematocrit: 35.9 % (ref 34.0–46.6)
Hemoglobin: 11.6 g/dL (ref 11.1–15.9)
Immature Grans (Abs): 0 10*3/uL (ref 0.0–0.1)
Immature Granulocytes: 0 %
Lymphocytes Absolute: 2.2 10*3/uL (ref 0.7–3.1)
Lymphs: 36 %
MCH: 30.2 pg (ref 26.6–33.0)
MCHC: 32.3 g/dL (ref 31.5–35.7)
MCV: 94 fL (ref 79–97)
Monocytes Absolute: 0.6 10*3/uL (ref 0.1–0.9)
Monocytes: 10 %
Neutrophils Absolute: 3.1 10*3/uL (ref 1.4–7.0)
Neutrophils: 52 %
Platelets: 228 10*3/uL (ref 150–450)
RBC: 3.84 x10E6/uL (ref 3.77–5.28)
RDW: 12.6 % (ref 11.7–15.4)
WBC: 6.2 10*3/uL (ref 3.4–10.8)

## 2023-06-16 LAB — COMPREHENSIVE METABOLIC PANEL
ALT: 15 [IU]/L (ref 0–32)
AST: 22 [IU]/L (ref 0–40)
Albumin: 4.1 g/dL (ref 3.9–4.9)
Alkaline Phosphatase: 64 [IU]/L (ref 44–121)
BUN/Creatinine Ratio: 26 (ref 12–28)
BUN: 17 mg/dL (ref 8–27)
Bilirubin Total: 0.2 mg/dL (ref 0.0–1.2)
CO2: 24 mmol/L (ref 20–29)
Calcium: 8.7 mg/dL (ref 8.7–10.3)
Chloride: 102 mmol/L (ref 96–106)
Creatinine, Ser: 0.66 mg/dL (ref 0.57–1.00)
Globulin, Total: 2.2 g/dL (ref 1.5–4.5)
Glucose: 70 mg/dL (ref 70–99)
Potassium: 3.5 mmol/L (ref 3.5–5.2)
Sodium: 142 mmol/L (ref 134–144)
Total Protein: 6.3 g/dL (ref 6.0–8.5)
eGFR: 94 mL/min/{1.73_m2} (ref >59)

## 2023-06-16 LAB — LIPID PANEL W/O CHOL/HDL RATIO
Cholesterol, Total: 130 mg/dL (ref 100–199)
HDL: 62 mg/dL (ref >39)
LDL Chol Calc (NIH): 56 mg/dL (ref 0–99)
Triglycerides: 52 mg/dL (ref 0–149)
VLDL Cholesterol Cal: 12 mg/dL (ref 5–40)

## 2023-06-16 LAB — TSH: TSH: 1.75 u[IU]/mL (ref 0.450–4.500)

## 2023-07-04 IMAGING — MG MM DIGITAL SCREENING BILAT W/ TOMO AND CAD
8 series · 8 of 24 positions shown · non-contrast
Comparison: Previous exam(s).

CLINICAL DATA: Screening.

EXAM:
DIGITAL SCREENING BILATERAL MAMMOGRAM WITH TOMOSYNTHESIS AND CAD
TECHNIQUE: Bilateral screening digital craniocaudal and mediolateral oblique
mammograms were obtained. Bilateral screening digital breast
tomosynthesis was performed. The images were evaluated with
computer-aided detection.

[R MLO synth-2D]
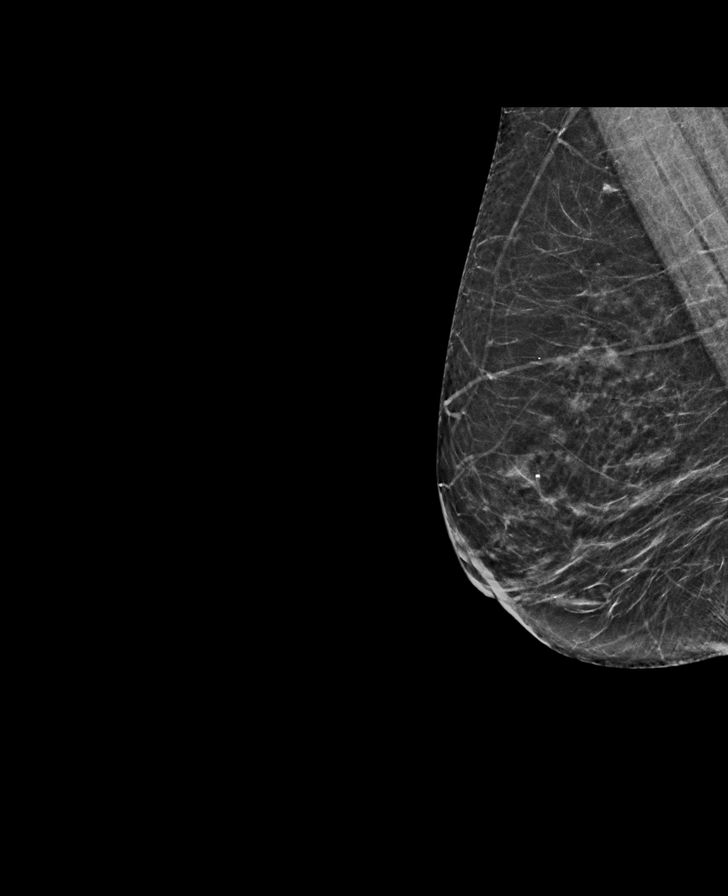

[L MLO synth-2D]
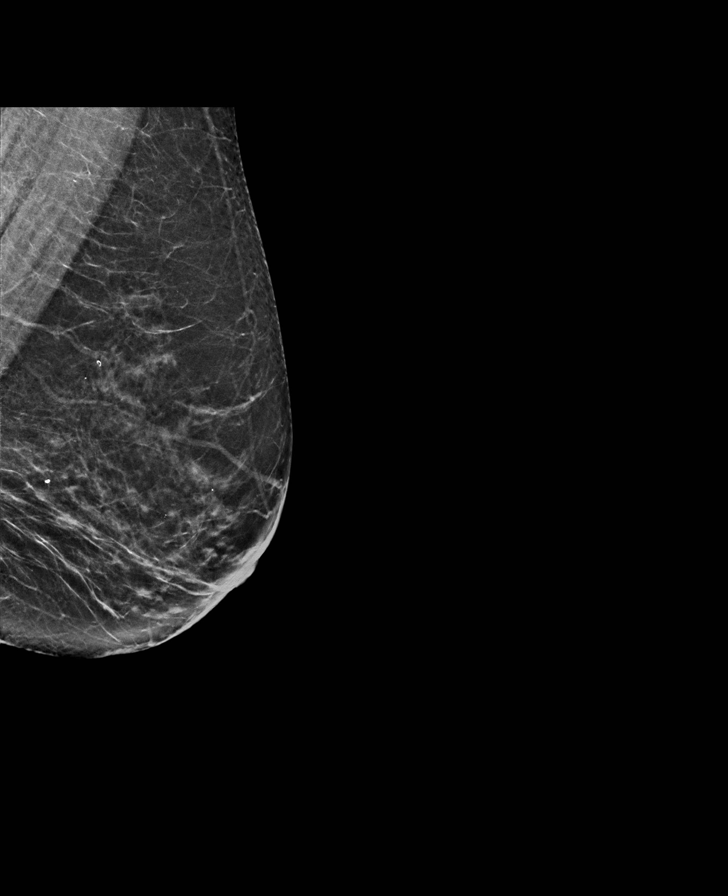

[R CC synth-2D]
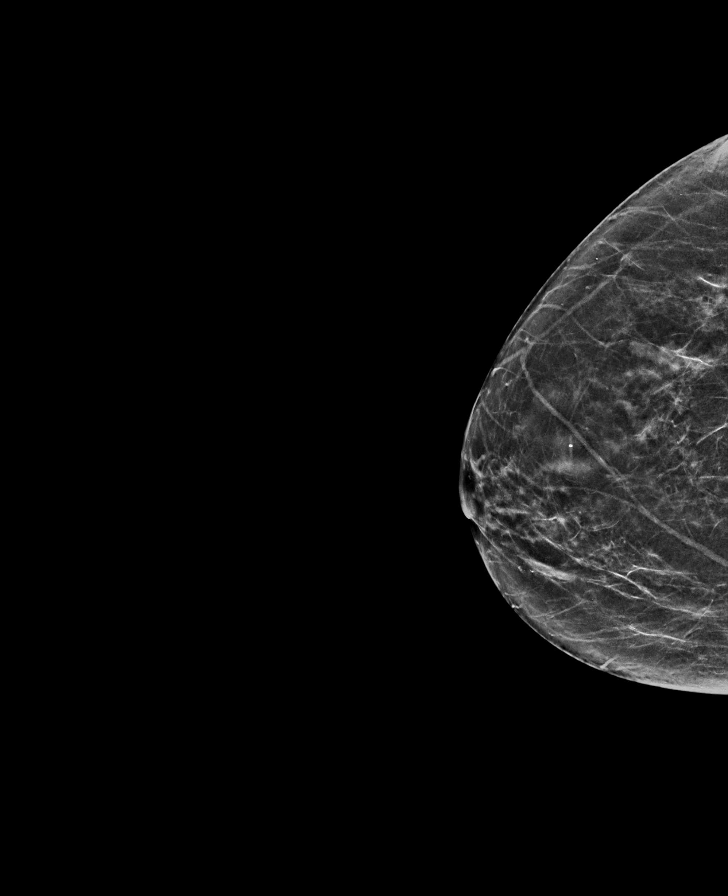

[L CC synth-2D]
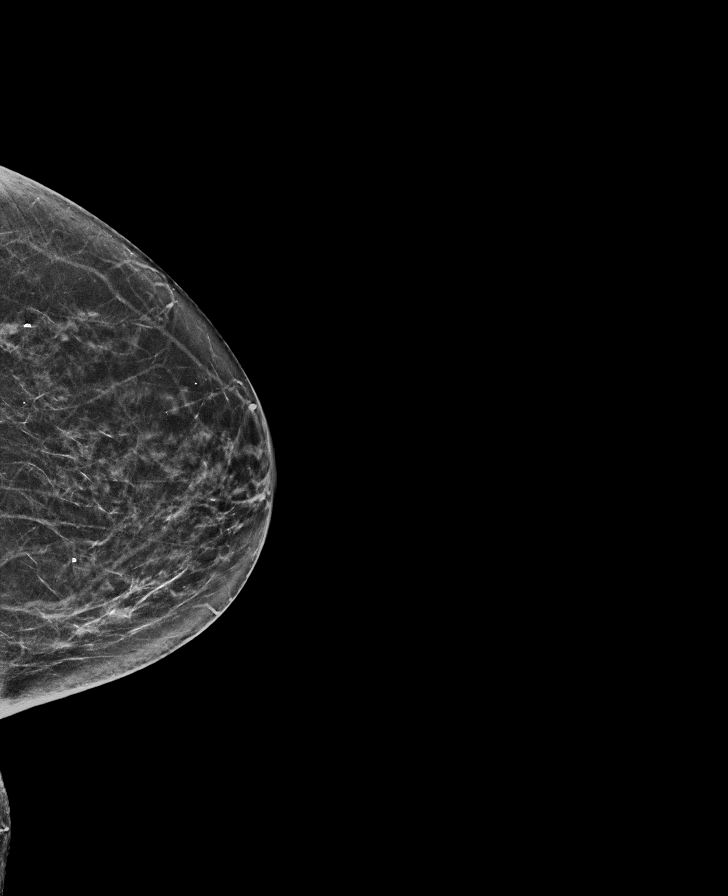

[R MLO tomo · tomo slice 29/57.0]
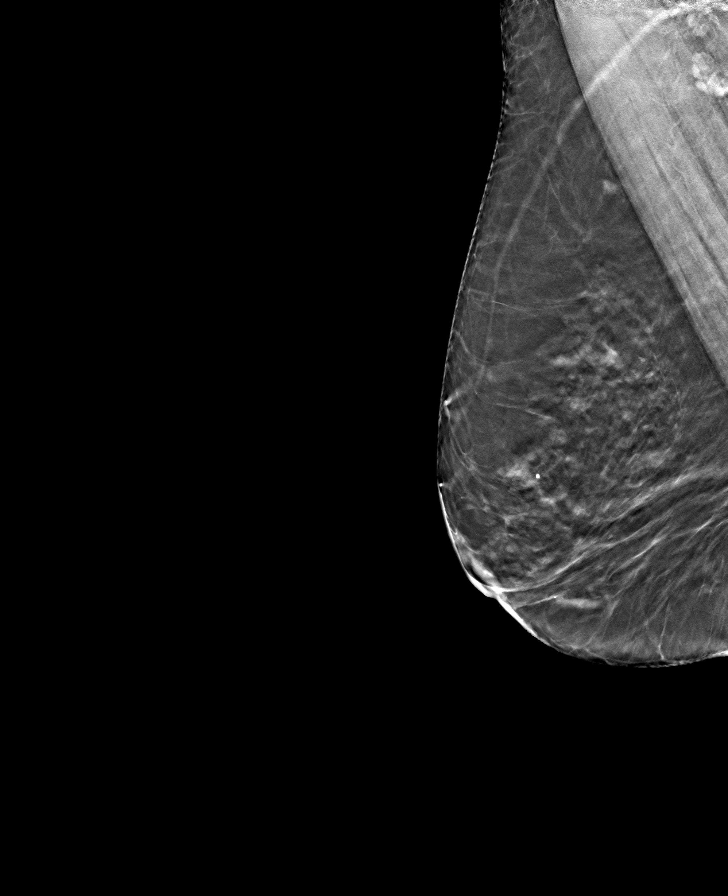

[L CC tomo · tomo slice 31/61.0]
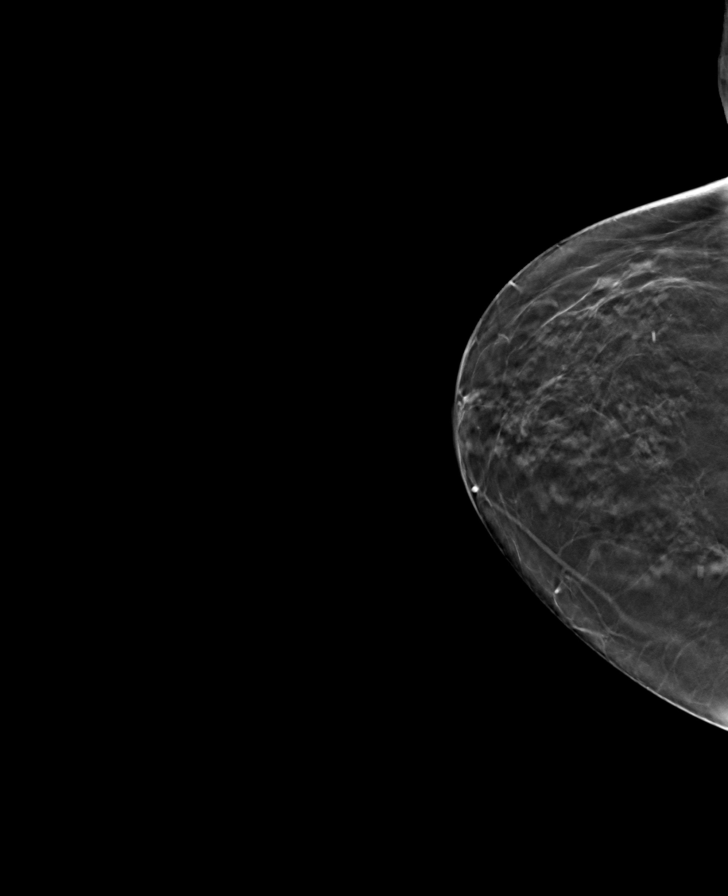

[R CC tomo · tomo slice 29/56.0]
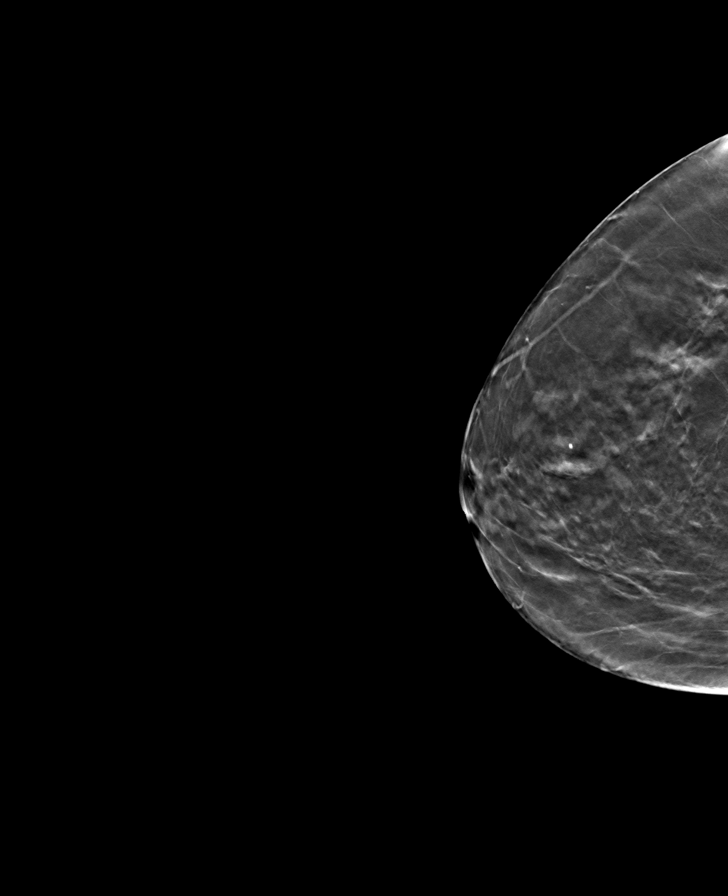

[L MLO tomo · tomo slice 31/61.0]
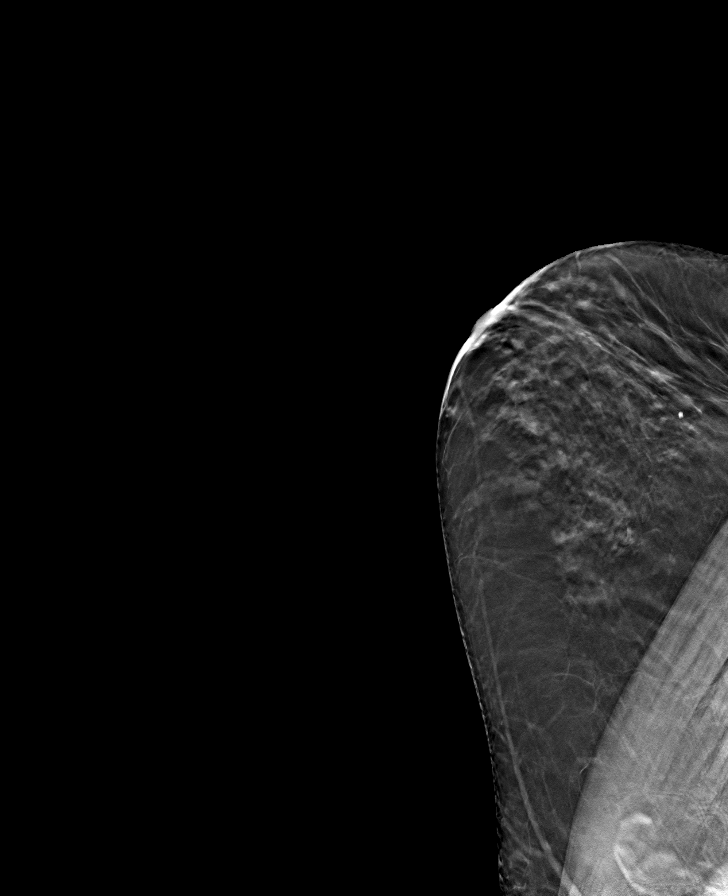

[8 of 24 positions shown; findings below may reference images not displayed]

ACR Breast Density Category b: There are scattered areas of
fibroglandular density.
FINDINGS: There are no findings suspicious for malignancy.
IMPRESSION: No mammographic evidence of malignancy. A result letter of this
screening mammogram will be mailed directly to the patient.

RECOMMENDATION:
Screening mammogram in one year. (Code:51-O-LD2)

BI-RADS CATEGORY  1: Negative.

## 2023-08-10 ENCOUNTER — Encounter: Payer: Self-pay | Admitting: Nurse Practitioner

## 2023-08-10 ENCOUNTER — Telehealth: Payer: Medicare Other | Admitting: Nurse Practitioner

## 2023-08-10 DIAGNOSIS — J0110 Acute frontal sinusitis, unspecified: Secondary | ICD-10-CM | POA: Insufficient documentation

## 2023-08-10 MED ORDER — PREDNISONE 20 MG PO TABS: 40.0000 mg | ORAL_TABLET | Freq: Every day | ORAL | 0 refills | Status: AC

## 2023-08-10 MED ORDER — AZITHROMYCIN 250 MG PO TABS: ORAL_TABLET | ORAL | 0 refills | Status: AC

## 2023-08-10 NOTE — Patient Instructions (Signed)

## 2023-08-10 NOTE — Progress Notes (Signed)
 There were no vitals taken for this visit.   Subjective:    Patient ID: Kathryn Conway, female    DOB: 01/16/1951, 73 y.o.   MRN: [id number]  HPI: Kathryn Conway is a 73 y.o. female  Chief Complaint  Patient presents with   URI    Patient states she has been having a cough, congestion, sinus pressure and headaches since Tuesday.    Virtual Visit via Video Note  I connected with Kathryn Conway on 08/10/23 at  4:20 PM EST by a video enabled telemedicine application and verified that I am speaking with the correct person using two identifiers.  Location: Patient: home Provider: work   I discussed the limitations of evaluation and management by telemedicine and the availability of in person appointments. The patient expressed understanding and agreed to proceed.  I discussed the assessment and treatment plan with the patient. The patient was provided an opportunity to ask questions and all were answered. The patient agreed with the plan and demonstrated an understanding of the instructions.   The patient was advised to call back or seek an in-person evaluation if the symptoms worsen or if the condition fails to improve as anticipated.  I provided 25 minutes of non-face-to-face time during this encounter.   Adynn Caseres T Greidy Sherard, NP   UPPER RESPIRATORY TRACT INFECTION Started feeling bad on 5 days ago, started with tickle in throat and trying to cough to get rid of it.  Started to run a low grade temperature.  Covid testing was negative. Fever: no Cough: yes Shortness of breath: no Wheezing: no Chest pain: no Chest tightness: no Chest congestion: no Nasal congestion: yes Runny nose: yes Post nasal drip: yes Sneezing: no Sore throat: no Swollen glands: no Sinus pressure: yes Headache: yes Face pain: yes Toothache: yes Ear pain: yes bilateral Ear pressure: yes bilateral Eyes red/itching:no Eye drainage/crusting: no  Vomiting: no Rash: no Fatigue: yes Sick  contacts: no Strep contacts: no  Context: worse Recurrent sinusitis: no Relief with OTC cold/cough medications: no  Treatments attempted: Delsym 12 hour, Coricidin    Relevant past medical, surgical, family and social history reviewed and updated as indicated. Interim medical history since our last visit reviewed. Allergies and medications reviewed and updated.  Review of Systems  Constitutional:  Positive for fatigue. Negative for activity change, appetite change, chills and fever.  HENT:  Positive for congestion, ear pain, postnasal drip, rhinorrhea, sinus pressure and sinus pain. Negative for ear discharge, facial swelling, sneezing, sore throat and voice change.   Respiratory:  Positive for cough. Negative for chest tightness, shortness of breath and wheezing.   Cardiovascular:  Negative for chest pain, palpitations and leg swelling.  Gastrointestinal: Negative.   Endocrine: Negative.   Neurological:  Positive for headaches. Negative for dizziness and numbness.  Psychiatric/Behavioral: Negative.      Per HPI unless specifically indicated above     Objective:    There were no vitals taken for this visit.  Wt Readings from Last 3 Encounters:  06/15/23 138 lb 6.4 oz (62.8 kg)  06/07/23 136 lb (61.7 kg)  11/21/22 132 lb 3.2 oz (60 kg)    Physical Exam Vitals and nursing note reviewed.  Constitutional:      General: She is awake. She is not in acute distress.    Appearance: She is well-developed. She is ill-appearing. She is not toxic-appearing.  HENT:     Head: Normocephalic.     Right Ear: Hearing normal.  Left Ear: Hearing normal.  Eyes:     General: Lids are normal.        Right eye: No discharge.        Left eye: No discharge.     Conjunctiva/sclera: Conjunctivae normal.  Pulmonary:     Effort: Pulmonary effort is normal. No accessory muscle usage or respiratory distress.  Musculoskeletal:     Cervical back: Normal range of motion.  Neurological:     Mental  Status: She is alert and oriented to person, place, and time.  Psychiatric:        Attention and Perception: Attention normal.        Mood and Affect: Mood normal.        Behavior: Behavior normal. Behavior is cooperative.        Thought Content: Thought content normal.        Judgment: Judgment normal.    Results for orders placed or performed in visit on 06/15/23  CBC with Differential/Platelet   Collection Time: 06/15/23  1:58 PM  Result Value Ref Range   WBC 6.2 3.4 - 10.8 x10E3/uL   RBC 3.84 3.77 - 5.28 x10E6/uL   Hemoglobin 11.6 11.1 - 15.9 g/dL   Hematocrit 64.0 65.9 - 46.6 %   MCV 94 79 - 97 fL   MCH 30.2 26.6 - 33.0 pg   MCHC 32.3 31.5 - 35.7 g/dL   RDW 87.3 88.2 - 84.5 %   Platelets 228 150 - 450 x10E3/uL   Neutrophils 52 Not Estab. %   Lymphs 36 Not Estab. %   Monocytes 10 Not Estab. %   Eos 2 Not Estab. %   Basos 0 Not Estab. %   Neutrophils Absolute 3.1 1.4 - 7.0 x10E3/uL   Lymphocytes Absolute 2.2 0.7 - 3.1 x10E3/uL   Monocytes Absolute 0.6 0.1 - 0.9 x10E3/uL   EOS (ABSOLUTE) 0.1 0.0 - 0.4 x10E3/uL   Basophils Absolute 0.0 0.0 - 0.2 x10E3/uL   Immature Granulocytes 0 Not Estab. %   Immature Grans (Abs) 0.0 0.0 - 0.1 x10E3/uL  Comprehensive metabolic panel   Collection Time: 06/15/23  1:58 PM  Result Value Ref Range   Glucose 70 70 - 99 mg/dL   BUN 17 8 - 27 mg/dL   Creatinine, Ser 9.33 0.57 - 1.00 mg/dL   eGFR 94 >40 fO/fpw/8.26   BUN/Creatinine Ratio 26 12 - 28   Sodium 142 134 - 144 mmol/L   Potassium 3.5 3.5 - 5.2 mmol/L   Chloride 102 96 - 106 mmol/L   CO2 24 20 - 29 mmol/L   Calcium  8.7 8.7 - 10.3 mg/dL   Total Protein 6.3 6.0 - 8.5 g/dL   Albumin 4.1 3.9 - 4.9 g/dL   Globulin, Total 2.2 1.5 - 4.5 g/dL   Bilirubin Total 0.2 0.0 - 1.2 mg/dL   Alkaline Phosphatase 64 44 - 121 IU/L   AST 22 0 - 40 IU/L   ALT 15 0 - 32 IU/L  Lipid Panel w/o Chol/HDL Ratio   Collection Time: 06/15/23  1:58 PM  Result Value Ref Range   Cholesterol, Total 130 100 -  199 mg/dL   Triglycerides 52 0 - 149 mg/dL   HDL 62 >60 mg/dL   VLDL Cholesterol Cal 12 5 - 40 mg/dL   LDL Chol Calc (NIH) 56 0 - 99 mg/dL  TSH   Collection Time: 06/15/23  1:58 PM  Result Value Ref Range   TSH 1.750 0.450 - 4.500 uIU/mL  Assessment & Plan:   Problem List Items Addressed This Visit       Respiratory   Acute frontal sinusitis - Primary   Acute for almost one week, negative Covid at home. Since going into the weekend will send in Zpack and Prednisone  40 MG daily for 5 days.  Help prevent need for UC visit or worsening symptoms.  Recommend: - Increased rest - Increasing Fluids - Acetaminophen  as needed for fever/pain.  - Salt water  gargling, chloraseptic spray and throat lozenges - OTC Coricidin - Mucinex.  - Humidifying the air.       Relevant Medications   azithromycin  (ZITHROMAX ) 250 MG tablet   predniSONE  (DELTASONE ) 20 MG tablet     Follow up plan: Return if symptoms worsen or fail to improve.

## 2023-08-10 NOTE — Assessment & Plan Note (Signed)
 Acute for almost one week, negative Covid at home. Since going into the weekend will send in Zpack and Prednisone  40 MG daily for 5 days.  Help prevent need for UC visit or worsening symptoms.  Recommend: - Increased rest - Increasing Fluids - Acetaminophen  as needed for fever/pain.  - Salt water  gargling, chloraseptic spray and throat lozenges - OTC Coricidin - Mucinex.  - Humidifying the air.

## 2023-08-23 DIAGNOSIS — H524 Presbyopia: Secondary | ICD-10-CM | POA: Diagnosis not present

## 2023-09-14 ENCOUNTER — Other Ambulatory Visit: Payer: Self-pay | Admitting: Family Medicine

## 2023-09-14 NOTE — Telephone Encounter (Signed)
 Request too soon for refill.  Requested Prescriptions  Pending Prescriptions Disp Refills   citalopram (CELEXA) 40 MG tablet [Pharmacy Med Name: CITALOPRAM HBR 40 MG TABLET] 90 tablet 1    Sig: TAKE 1 TABLET BY MOUTH EVERY DAY     Psychiatry:  Antidepressants - SSRI Passed - 09/14/2023  2:55 PM      Passed - Completed PHQ-2 or PHQ-9 in the last 360 days      Passed - Valid encounter within last 6 months    Recent Outpatient Visits           3 months ago Routine general medical examination at a health care facility   [facility], Megan P, DO   9 months ago Hypercholesterolemia   [place] [facility] [place], Megan P, DO   1 year ago Encounter for [employer] annual wellness exam   [facility] [place], [place] P, DO   1 year ago Hypercholesterolemia   [place] [facility] [place], [place] P, DO   2 years ago Routine general medical examination at a health care facility   [facility] Dorcas Carrow, DO       Future Appointments             In 3 months Laural Benes, Oralia Rud, DO [place] [facility]

## 2023-11-02 ENCOUNTER — Other Ambulatory Visit: Payer: Self-pay | Admitting: Family Medicine

## 2023-11-02 NOTE — Telephone Encounter (Unsigned)
 Copied from CRM [id number]. Topic: Clinical - Medication Refill >> Nov 02, 2023  8:46 AM Everette C wrote: Most Recent Primary Care Visit:  Provider: CANNADY, JOLENE T  Department: [facility]  Visit Type: MYCHART VIDEO VISIT  Date: 08/10/2023  Medication: atorvastatin  (LIPITOR) 40 MG tablet [[id number]]  Has the patient contacted their pharmacy? Yes (Agent: If no, request that the patient contact the pharmacy for the refill. If patient does not wish to contact the pharmacy document the reason why and proceed with request.) (Agent: If yes, when and what did the pharmacy advise?)  Is this the correct pharmacy for this prescription? Yes If no, delete pharmacy and type the correct one.  This is the patient's preferred pharmacy:  [id number], [place] - [address] AT [facility] [address] [place] [address] Phone: [phone] Fax: [phone]  [facility] [address] [place] - [address] [place] [address] Phone: [phone] Fax: [phone]   Has the prescription been filled recently? No  Is the patient out of the medication? No  Has the patient been seen for an appointment in the last year OR does the patient have an upcoming appointment? Yes  Can we respond through MyChart? No  Agent: Please be advised that Rx refills may take up to 3 business days. We ask that you follow-up with your pharmacy.

## 2023-11-05 NOTE — Telephone Encounter (Signed)
 Rx 06/15/23 #90 1RF- too soon Requested Prescriptions  Pending Prescriptions Disp Refills   atorvastatin  (LIPITOR) 40 MG tablet 90 tablet 1    Sig: Take 1 tablet (40 mg total) by mouth daily.     Cardiovascular:  Antilipid - Statins Failed - 11/05/2023  3:24 PM      Failed - Lipid Panel in normal range within the last 12 months    Cholesterol, Total  Date Value Ref Range Status  06/15/2023 130 100 - 199 mg/dL Final   LDL Chol Calc (NIH)  Date Value Ref Range Status  06/15/2023 56 0 - 99 mg/dL Final   HDL  Date Value Ref Range Status  06/15/2023 62 >39 mg/dL Final   Triglycerides  Date Value Ref Range Status  06/15/2023 52 0 - 149 mg/dL Final         Passed - Patient is not pregnant      Passed - Valid encounter within last 12 months    Recent Outpatient Visits           2 months ago Acute non-recurrent frontal sinusitis   [place] [facility] [place], Lavelle Posey, NP       Future Appointments             In 1 month Johnson, Jerilee Montane, DO [place] [facility]

## 2023-11-07 NOTE — Telephone Encounter (Signed)
 Copied from CRM [id number]. Topic: Clinical - Prescription Issue >> Nov 07, 2023  8:41 AM Zipporah Him wrote: Reason for CRM: Patient has been waiting on a refill of atorvastatin  (LIPITOR) 40 MG tablet. Note states refill was requested too soon. She is now out of her meds. Please advise patient asap.

## 2023-11-29 DIAGNOSIS — M778 Other enthesopathies, not elsewhere classified: Secondary | ICD-10-CM | POA: Diagnosis not present

## 2023-11-29 DIAGNOSIS — M47812 Spondylosis without myelopathy or radiculopathy, cervical region: Secondary | ICD-10-CM | POA: Diagnosis not present

## 2023-12-13 ENCOUNTER — Encounter: Payer: Self-pay | Admitting: Family Medicine

## 2023-12-13 ENCOUNTER — Ambulatory Visit (INDEPENDENT_AMBULATORY_CARE_PROVIDER_SITE_OTHER): Payer: Medicare Other | Admitting: Family Medicine

## 2023-12-13 VITALS — BP 136/72 | HR 74 | Ht 63.0 in | Wt 133.8 lb

## 2023-12-13 DIAGNOSIS — M5412 Radiculopathy, cervical region: Secondary | ICD-10-CM | POA: Diagnosis not present

## 2023-12-13 DIAGNOSIS — E7800 Pure hypercholesterolemia, unspecified: Secondary | ICD-10-CM | POA: Diagnosis not present

## 2023-12-13 DIAGNOSIS — M47812 Spondylosis without myelopathy or radiculopathy, cervical region: Secondary | ICD-10-CM | POA: Diagnosis not present

## 2023-12-13 DIAGNOSIS — M7542 Impingement syndrome of left shoulder: Secondary | ICD-10-CM | POA: Diagnosis not present

## 2023-12-13 DIAGNOSIS — F3342 Major depressive disorder, recurrent, in full remission: Secondary | ICD-10-CM | POA: Diagnosis not present

## 2023-12-13 DIAGNOSIS — F5101 Primary insomnia: Secondary | ICD-10-CM | POA: Diagnosis not present

## 2023-12-13 MED ORDER — ZOLPIDEM TARTRATE 10 MG PO TABS: 5.0000 mg | ORAL_TABLET | Freq: Every evening | ORAL | 5 refills | Status: DC | PRN

## 2023-12-13 MED ORDER — ATORVASTATIN CALCIUM 40 MG PO TABS: 40.0000 mg | ORAL_TABLET | Freq: Every day | ORAL | 1 refills | Status: DC

## 2023-12-13 MED ORDER — CITALOPRAM HYDROBROMIDE 40 MG PO TABS: 40.0000 mg | ORAL_TABLET | Freq: Every day | ORAL | 1 refills | Status: DC

## 2023-12-13 MED ORDER — GABAPENTIN 300 MG PO CAPS: 600.0000 mg | ORAL_CAPSULE | Freq: Two times a day (BID) | ORAL | 1 refills | Status: DC

## 2023-12-13 NOTE — Assessment & Plan Note (Signed)
 Under good control on current regimen. Continue current regimen. Continue to monitor. Call with any concerns. Refills given.

## 2023-12-13 NOTE — Assessment & Plan Note (Signed)
 Under good control on current regimen. Continue current regimen. Continue to monitor. Call with any concerns. Refills given. Labs drawn today.

## 2023-12-13 NOTE — Progress Notes (Signed)
 BP 136/72   Pulse 74   Ht 5' 3 (1.6 m)   Wt 133 lb 12.8 oz (60.7 kg)   SpO2 98%   BMI 23.70 kg/m    Subjective:    Patient ID: Kathryn Conway, female    DOB: July 24, 1950, 73 y.o.   MRN: [id number]  HPI: Kathryn Conway is a 73 y.o. female  Chief Complaint  Patient presents with   6 MONTH FOLLOW UP    Depression Hypercholesterolemia    HYPERLIPIDEMIA Hyperlipidemia status: excellent compliance Satisfied with current treatment?  yes Side effects:  no Medication compliance: excellent compliance Past cholesterol meds: atorvastatin  Supplements: none Aspirin:  no The 10-year ASCVD risk score (Arnett DK, et al., 2019) is: 9.1%   Values used to calculate the score:     Age: 31 years     Clincally relevant sex: Female     Is Non-Hispanic African American: No     Diabetic: No     Tobacco smoker: No     Systolic Blood Pressure: 136 mmHg     Is BP treated: No     HDL Cholesterol: 62 mg/dL     Total Cholesterol: 130 mg/dL Chest pain:  no Coronary artery disease:  no  DEPRESSION Mood status: controlled Satisfied with current treatment?: yes Symptom severity: mild  Duration of current treatment : chronic Side effects: no Medication compliance: excellent compliance Psychotherapy/counseling: no  Previous psychiatric medications: celexa  Depressed mood: no Anxious mood: no Anhedonia: no Significant weight loss or gain: no Insomnia: yes hard to fall asleep Fatigue: yes Feelings of worthlessness or guilt: no Impaired concentration/indecisiveness: no Suicidal ideations: no Hopelessness: no Crying spells: no    06/15/2023    1:45 PM 06/07/2023   10:34 AM 11/21/2022    4:09 PM 05/24/2022   10:47 AM 11/14/2021    2:09 PM  Depression screen PHQ 2/9  Decreased Interest 0 0 0 0 0  Down, Depressed, Hopeless 0 0 0 0 0  PHQ - 2 Score 0 0 0 0 0  Altered sleeping 2 0 2 3 2   Tired, decreased energy 2 0 1 2 1   Change in appetite 0 0 0 0 0  Feeling bad or failure about  yourself  0 0 0 0 0  Trouble concentrating 0 0 0 0 0  Moving slowly or fidgety/restless 0 0 0 0 0  Suicidal thoughts 0 0 0 0 0  PHQ-9 Score 4 0 3 5 3   Difficult doing work/chores Not difficult at all Not difficult at all Not difficult at all Not difficult at all     INSOMNIA Duration: chronic Satisfied with sleep quality: yes Difficulty falling asleep: yes Difficulty staying asleep: no Waking a few hours after sleep onset: no Early morning awakenings: no Daytime hypersomnolence: no Wakes feeling refreshed: no Good sleep hygiene: no Apnea: no Snoring: no Depressed/anxious mood: yes Recent stress: no Restless legs/nocturnal leg cramps: no Chronic pain/arthritis: no History of sleep study: no Treatments attempted: melatonin, uinsom, benadryl, and ambien    Relevant past medical, surgical, family and social history reviewed and updated as indicated. Interim medical history since our last visit reviewed. Allergies and medications reviewed and updated.  Review of Systems  Constitutional: Negative.   Respiratory: Negative.    Cardiovascular: Negative.   Musculoskeletal:  Positive for arthralgias, myalgias and neck pain. Negative for back pain, gait problem, joint swelling and neck stiffness.  Psychiatric/Behavioral: Negative.      Per HPI unless specifically indicated above  Objective:    BP 136/72   Pulse 74   Ht 5' 3 (1.6 m)   Wt 133 lb 12.8 oz (60.7 kg)   SpO2 98%   BMI 23.70 kg/m   Wt Readings from Last 3 Encounters:  12/13/23 133 lb 12.8 oz (60.7 kg)  06/15/23 138 lb 6.4 oz (62.8 kg)  06/07/23 136 lb (61.7 kg)    Physical Exam Vitals and nursing note reviewed.  Constitutional:      General: She is not in acute distress.    Appearance: Normal appearance. She is not ill-appearing, toxic-appearing or diaphoretic.  HENT:     Head: Normocephalic and atraumatic.     Right Ear: External ear normal.     Left Ear: External ear normal.     Nose: Nose normal.      Mouth/Throat:     Mouth: Mucous membranes are moist.     Pharynx: Oropharynx is clear.   Eyes:     General: No scleral icterus.       Right eye: No discharge.        Left eye: No discharge.     Extraocular Movements: Extraocular movements intact.     Conjunctiva/sclera: Conjunctivae normal.     Pupils: Pupils are equal, round, and reactive to light.    Cardiovascular:     Rate and Rhythm: Normal rate and regular rhythm.     Pulses: Normal pulses.     Heart sounds: Normal heart sounds. No murmur heard.    No friction rub. No gallop.  Pulmonary:     Effort: Pulmonary effort is normal. No respiratory distress.     Breath sounds: Normal breath sounds. No stridor. No wheezing, rhonchi or rales.  Chest:     Chest wall: No tenderness.   Musculoskeletal:        General: Normal range of motion.     Cervical back: Normal range of motion and neck supple.   Skin:    General: Skin is warm and dry.     Capillary Refill: Capillary refill takes less than 2 seconds.     Coloration: Skin is not jaundiced or pale.     Findings: No bruising, erythema, lesion or rash.   Neurological:     General: No focal deficit present.     Mental Status: She is alert and oriented to person, place, and time. Mental status is at baseline.   Psychiatric:        Mood and Affect: Mood normal.        Behavior: Behavior normal.        Thought Content: Thought content normal.        Judgment: Judgment normal.     Results for orders placed or performed in visit on 06/15/23  CBC with Differential/Platelet   Collection Time: 06/15/23  1:58 PM  Result Value Ref Range   WBC 6.2 3.4 - 10.8 x10E3/uL   RBC 3.84 3.77 - 5.28 x10E6/uL   Hemoglobin 11.6 11.1 - 15.9 g/dL   Hematocrit 16.1 09.6 - 46.6 %   MCV 94 79 - 97 fL   MCH 30.2 26.6 - 33.0 pg   MCHC 32.3 31.5 - 35.7 g/dL   RDW 04.5 40.9 - 81.1 %   Platelets 228 150 - 450 x10E3/uL   Neutrophils 52 Not Estab. %   Lymphs 36 Not Estab. %   Monocytes 10 Not  Estab. %   Eos 2 Not Estab. %   Basos 0 Not Estab. %  Neutrophils Absolute 3.1 1.4 - 7.0 x10E3/uL   Lymphocytes Absolute 2.2 0.7 - 3.1 x10E3/uL   Monocytes Absolute 0.6 0.1 - 0.9 x10E3/uL   EOS (ABSOLUTE) 0.1 0.0 - 0.4 x10E3/uL   Basophils Absolute 0.0 0.0 - 0.2 x10E3/uL   Immature Granulocytes 0 Not Estab. %   Immature Grans (Abs) 0.0 0.0 - 0.1 x10E3/uL  Comprehensive metabolic panel   Collection Time: 06/15/23  1:58 PM  Result Value Ref Range   Glucose 70 70 - 99 mg/dL   BUN 17 8 - 27 mg/dL   Creatinine, Ser 1.61 0.57 - 1.00 mg/dL   eGFR 94 >09 UE/AVW/0.98   BUN/Creatinine Ratio 26 12 - 28   Sodium 142 134 - 144 mmol/L   Potassium 3.5 3.5 - 5.2 mmol/L   Chloride 102 96 - 106 mmol/L   CO2 24 20 - 29 mmol/L   Calcium  8.7 8.7 - 10.3 mg/dL   Total Protein 6.3 6.0 - 8.5 g/dL   Albumin 4.1 3.9 - 4.9 g/dL   Globulin, Total 2.2 1.5 - 4.5 g/dL   Bilirubin Total 0.2 0.0 - 1.2 mg/dL   Alkaline Phosphatase 64 44 - 121 IU/L   AST 22 0 - 40 IU/L   ALT 15 0 - 32 IU/L  Lipid Panel w/o Chol/HDL Ratio   Collection Time: 06/15/23  1:58 PM  Result Value Ref Range   Cholesterol, Total 130 100 - 199 mg/dL   Triglycerides 52 0 - 149 mg/dL   HDL 62 >11 mg/dL   VLDL Cholesterol Cal 12 5 - 40 mg/dL   LDL Chol Calc (NIH) 56 0 - 99 mg/dL  TSH   Collection Time: 06/15/23  1:58 PM  Result Value Ref Range   TSH 1.750 0.450 - 4.500 uIU/mL      Assessment & Plan:   Problem List Items Addressed This Visit       Other   Hypercholesterolemia - Primary   Under good control on current regimen. Continue current regimen. Continue to monitor. Call with any concerns. Refills given. Labs drawn today.        Relevant Medications   atorvastatin  (LIPITOR) 40 MG tablet   Other Relevant Orders   Comprehensive metabolic panel with GFR   Lipid Panel w/o Chol/HDL Ratio   Insomnia   Under good control on current regimen. Continue current regimen. Continue to monitor. Call with any concerns. Refills given  for 6 months. Follow up 6 months.        Recurrent major depressive disorder, in full remission (HCC)   Under good control on current regimen. Continue current regimen. Continue to monitor. Call with any concerns. Refills given.        Relevant Medications   citalopram  (CELEXA ) 40 MG tablet     Follow up plan: Return in about 6 months (around 06/13/2024) for physical.

## 2023-12-13 NOTE — Assessment & Plan Note (Signed)
Under good control on current regimen. Continue current regimen. Continue to monitor. Call with any concerns. Refills given for 6 months. Follow up 6 months. 

## 2023-12-14 LAB — LIPID PANEL W/O CHOL/HDL RATIO
Cholesterol, Total: 142 mg/dL (ref 100–199)
HDL: 67 mg/dL (ref >39)
LDL Chol Calc (NIH): 59 mg/dL (ref 0–99)
Triglycerides: 84 mg/dL (ref 0–149)
VLDL Cholesterol Cal: 16 mg/dL (ref 5–40)

## 2023-12-14 LAB — COMPREHENSIVE METABOLIC PANEL WITH GFR
ALT: 19 IU/L (ref 0–32)
AST: 23 IU/L (ref 0–40)
Albumin: 4.6 g/dL (ref 3.9–4.9)
Alkaline Phosphatase: 67 IU/L (ref 44–121)
BUN/Creatinine Ratio: 21 (ref 12–28)
BUN: 16 mg/dL (ref 8–27)
Bilirubin Total: 0.3 mg/dL (ref 0.0–1.2)
CO2: 24 mmol/L (ref 20–29)
Calcium: 9.2 mg/dL (ref 8.7–10.3)
Chloride: 102 mmol/L (ref 96–106)
Creatinine, Ser: 0.77 mg/dL (ref 0.57–1.00)
Globulin, Total: 2.1 g/dL (ref 1.5–4.5)
Glucose: 67 mg/dL — ABNORMAL LOW (ref 70–99)
Potassium: 4.3 mmol/L (ref 3.5–5.2)
Sodium: 141 mmol/L (ref 134–144)
Total Protein: 6.7 g/dL (ref 6.0–8.5)
eGFR: 83 mL/min/{1.73_m2} (ref >59)

## 2023-12-16 ENCOUNTER — Ambulatory Visit: Payer: Self-pay | Admitting: Family Medicine

## 2024-01-10 DIAGNOSIS — M47812 Spondylosis without myelopathy or radiculopathy, cervical region: Secondary | ICD-10-CM | POA: Diagnosis not present

## 2024-01-10 DIAGNOSIS — M778 Other enthesopathies, not elsewhere classified: Secondary | ICD-10-CM | POA: Diagnosis not present

## 2024-01-10 DIAGNOSIS — M5412 Radiculopathy, cervical region: Secondary | ICD-10-CM | POA: Diagnosis not present

## 2024-01-10 DIAGNOSIS — M7542 Impingement syndrome of left shoulder: Secondary | ICD-10-CM | POA: Diagnosis not present

## 2024-02-27 DIAGNOSIS — K080 Exfoliation of teeth due to systemic causes: Secondary | ICD-10-CM | POA: Diagnosis not present

## 2024-05-15 DIAGNOSIS — K080 Exfoliation of teeth due to systemic causes: Secondary | ICD-10-CM | POA: Diagnosis not present

## 2024-06-13 ENCOUNTER — Other Ambulatory Visit: Payer: Self-pay | Admitting: Family Medicine

## 2024-06-16 NOTE — Telephone Encounter (Signed)
 Requested medications are due for refill today.  yes  Requested medications are on the active medications list.  yes  Last refill. 12/13/2023 #15 5 rf  Future visit scheduled.   yes  Notes to clinic.  Rx written to expire 01/12/2024 Rx is expired. Refill not delegated.    Requested Prescriptions  Pending Prescriptions Disp Refills   zolpidem  (AMBIEN ) 10 MG tablet [Pharmacy Med Name: ZOLPIDEM  10MG  TABLETS] 15 tablet     Sig: TAKE 1/2 TABLET(5 MG) BY MOUTH AT BEDTIME AS NEEDED FOR SLEEP     Not Delegated - Psychiatry:  Anxiolytics/Hypnotics Failed - 06/16/2024  4:01 PM      Failed - This refill cannot be delegated      Failed - Urine Drug Screen completed in last 360 days      Failed - Valid encounter within last 6 months    Recent Outpatient Visits           6 months ago Hypercholesterolemia   [place] [facility] [place], Megan P, DO   10 months ago Acute non-recurrent frontal sinusitis   [place] [facility] [place], Melanie DASEN, NP

## 2024-06-19 ENCOUNTER — Ambulatory Visit: Admitting: Family Medicine

## 2024-06-19 ENCOUNTER — Encounter: Payer: Self-pay | Admitting: Family Medicine

## 2024-06-19 ENCOUNTER — Ambulatory Visit

## 2024-06-19 VITALS — Ht 63.0 in | Wt 130.0 lb

## 2024-06-19 VITALS — BP 132/68 | HR 74 | Temp 98.0°F | Ht 63.0 in | Wt 131.4 lb

## 2024-06-19 DIAGNOSIS — F3341 Major depressive disorder, recurrent, in partial remission: Secondary | ICD-10-CM | POA: Diagnosis not present

## 2024-06-19 DIAGNOSIS — Z1231 Encounter for screening mammogram for malignant neoplasm of breast: Secondary | ICD-10-CM

## 2024-06-19 DIAGNOSIS — Z0000 Encounter for general adult medical examination without abnormal findings: Secondary | ICD-10-CM

## 2024-06-19 DIAGNOSIS — E7800 Pure hypercholesterolemia, unspecified: Secondary | ICD-10-CM | POA: Diagnosis not present

## 2024-06-19 DIAGNOSIS — M816 Localized osteoporosis [Lequesne]: Secondary | ICD-10-CM

## 2024-06-19 DIAGNOSIS — R32 Unspecified urinary incontinence: Secondary | ICD-10-CM | POA: Diagnosis not present

## 2024-06-19 DIAGNOSIS — F5101 Primary insomnia: Secondary | ICD-10-CM

## 2024-06-19 DIAGNOSIS — F3342 Major depressive disorder, recurrent, in full remission: Secondary | ICD-10-CM | POA: Diagnosis not present

## 2024-06-19 LAB — URINALYSIS, ROUTINE W REFLEX MICROSCOPIC
Bilirubin, UA: NEGATIVE
Glucose, UA: NEGATIVE
Ketones, UA: NEGATIVE
Leukocytes,UA: NEGATIVE
Nitrite, UA: NEGATIVE
Protein,UA: NEGATIVE
RBC, UA: NEGATIVE
Specific Gravity, UA: 1.015 (ref 1.005–1.030)
Urobilinogen, Ur: 0.2 mg/dL (ref 0.2–1.0)
pH, UA: 6.5 (ref 5.0–7.5)

## 2024-06-19 LAB — WET PREP FOR TRICH, YEAST, CLUE
Clue Cell Exam: NEGATIVE
Trichomonas Exam: NEGATIVE
Yeast Exam: NEGATIVE

## 2024-06-19 MED ORDER — CITALOPRAM HYDROBROMIDE 40 MG PO TABS: 40.0000 mg | ORAL_TABLET | Freq: Every day | ORAL | 1 refills | Status: AC

## 2024-06-19 MED ORDER — ZOLPIDEM TARTRATE 10 MG PO TABS: 5.0000 mg | ORAL_TABLET | Freq: Every evening | ORAL | 5 refills | Status: AC | PRN

## 2024-06-19 NOTE — Assessment & Plan Note (Signed)
 Has been off her atorvastatin - will check labs and treat as needed- may benefit from crestor.

## 2024-06-19 NOTE — Progress Notes (Signed)
 Chief Complaint  Patient presents with   Medicare Wellness     Subjective:   Kathryn Conway is a 73 y.o. female who presents for a Medicare Annual Wellness Visit.  Visit info / Clinical Intake: Medicare Wellness Visit Type:: Subsequent Annual Wellness Visit Persons participating in visit and providing information:: patient Medicare Wellness Visit Mode:: Telephone If telephone:: video declined Since this visit was completed virtually, some vitals may be partially provided or unavailable. Missing vitals are due to the limitations of the virtual format.: Documented vitals are patient reported If Telephone or Video please confirm:: I connected with patient using audio/video enable telemedicine. I verified patient identity with two identifiers, discussed telehealth limitations, and patient agreed to proceed. Patient Location:: home Provider Location:: home office Interpreter Needed?: No Pre-visit prep was completed: yes AWV questionnaire completed by patient prior to visit?: no Living arrangements:: lives with spouse/significant other Patient's Overall Health Status Rating: very good Typical amount of pain: some Does pain affect daily life?: (!) yes Are you currently prescribed opioids?: no  Dietary Habits and Nutritional Risks How many meals a day?: 3 Eats fruit and vegetables daily?: yes Most meals are obtained by: preparing own meals In the last 2 weeks, have you had any of the following?: none Diabetic:: no  Functional Status Activities of Daily Living (to include ambulation/medication): Independent Ambulation: Independent with device- listed below Home Assistive Devices/Equipment: Contact lenses Medication Administration: Independent Home Management (perform basic housework or laundry): Independent Manage your own finances?: yes Primary transportation is: driving Concerns about vision?: no *vision screening is required for WTM* Concerns about hearing?: no  Fall  Screening Falls in the past year?: 0 Number of falls in past year: 0 Was there an injury with Fall?: 0 Fall Risk Category Calculator: 0 Patient Fall Risk Level: Low Fall Risk  Fall Risk Patient at Risk for Falls Due to: No Fall Risks Fall risk Follow up: Falls evaluation completed  Home and Transportation Safety: All rugs have non-skid backing?: yes All stairs or steps have railings?: (!) no (2 steps without railing) Grab bars in the bathtub or shower?: (!) no Have non-skid surface in bathtub or shower?: yes Good home lighting?: yes Regular seat belt use?: yes Hospital stays in the last year:: no  Cognitive Assessment Difficulty concentrating, remembering, or making decisions? : yes (trouble remembering sometimes) Will 6CIT or Mini Cog be Completed: yes What year is it?: 0 points What month is it?: 0 points Give patient an address phrase to remember (5 components): [address] [place] About what time is it?: 0 points Count backwards from 20 to 1: 0 points Say the months of the year in reverse: 0 points Repeat the address phrase from earlier: 2 points 6 CIT Score: 2 points  Advance Directives (For Healthcare) Does Patient Have a Medical Advance Directive?: No Would patient like information on creating a medical advance directive?: Yes (MAU/Ambulatory/Procedural Areas - Information given)  Reviewed/Updated  Reviewed/Updated: Reviewed All (Medical, Surgical, Family, Medications, Allergies, Care Teams, Patient Goals)    Allergies (verified) Sulfa antibiotics and Clindamycin/lincomycin   Current Medications (verified) Outpatient Encounter Medications as of 06/19/2024  Medication Sig   citalopram  (CELEXA ) 40 MG tablet Take 1 tablet (40 mg total) by mouth daily.   Cyanocobalamin (VITAMIN B12 PO) Take 1 tablet by mouth daily.   ibuprofen (ADVIL) 800 MG tablet Take 800 mg by mouth every 8 (eight) hours as needed.   loratadine (CLARITIN) 10 MG tablet Take 10 mg by mouth  daily.   MEGARED OMEGA-3 KRILL OIL PO Take by mouth daily.   zolpidem  (AMBIEN ) 10 MG tablet Take 0.5 tablets (5 mg total) by mouth at bedtime as needed for sleep.   alendronate  (FOSAMAX ) 70 MG tablet TAKE 1 TABLET BY MOUTH EVERY 7 (SEVEN) DAYS. TAKE WITH A FULL GLASS OF WATER  ON AN EMPTY STOMACH. (Patient not taking: Reported on 06/19/2024)   atorvastatin  (LIPITOR) 40 MG tablet Take 1 tablet (40 mg total) by mouth daily. (Patient not taking: Reported on 06/19/2024)   gabapentin  (NEURONTIN ) 300 MG capsule Take 2 capsules (600 mg total) by mouth 2 (two) times daily. (Patient not taking: Reported on 06/19/2024)   meloxicam (MOBIC) 15 MG tablet Take 15 mg by mouth 3 (three) times daily. (Patient not taking: Reported on 06/19/2024)   No facility-administered encounter medications on file as of 06/19/2024.    History: Past Medical History:  Diagnosis Date   Depression    Fibromyalgia    Hyperlipidemia    Insomnia    PONV (postoperative nausea and vomiting)    TMJ (temporomandibular joint disorder)    Uterine fibroid    Wears contact lenses    Wears dentures    full upper, partial lower   Past Surgical History:  Procedure Laterality Date   COLONOSCOPY  05/20/2012   repeat 5 years   COLONOSCOPY WITH PROPOFOL  N/A 04/26/2018   Procedure: COLONOSCOPY WITH PROPOFOL ;  Surgeon: Jinny Carmine, MD;  Location: [facility];  Service: Endoscopy;  Laterality: N/A;   laparoscopic gyn procedure     done before her hysterectomy   OOPHORECTOMY Left    TOTAL ABDOMINAL HYSTERECTOMY     Family History  Problem Relation Age of Onset   Heart disease Mother    Hypertension Mother    Depression Mother    Osteoporosis Mother    Colon polyps Mother    Cirrhosis Mother        non alcoholic   Liver disease Mother        fatty liver disease   Heart disease Father    Hypertension Father    Colon polyps Father    Stroke Father    Heart disease Sister    Hypertension Sister    Colon polyps  Sister    Diabetes Sister    Hypertension Daughter    Colon polyps Daughter    Heart attack Brother    Hypertension Brother    Heart attack Brother    Hyperlipidemia Son    Breast cancer Neg Hx    Social History   Occupational History   Occupation: part time   Tobacco Use   Smoking status: Never   Smokeless tobacco: Never  Vaping Use   Vaping status: Never Used  Substance and Sexual Activity   Alcohol use: Yes    Alcohol/week: 3.0 standard drinks of alcohol    Types: 3 Glasses of wine per week    Comment: 1 glass of wine 3 times per week   Drug use: No   Sexual activity: Yes    Birth control/protection: Surgical    Comment: Hysterectomy   Tobacco Counseling Counseling given: Not Answered  SDOH Screenings   Food Insecurity: No Food Insecurity (06/19/2024)  Housing: Low Risk (06/19/2024)  Transportation Needs: No Transportation Needs (06/19/2024)  Utilities: Not At Risk (06/19/2024)  Alcohol Screen: Low Risk (06/07/2023)  Depression (PHQ2-9): Low Risk (06/19/2024)  Financial Resource Strain: Low Risk (06/07/2023)  Physical Activity: Inactive (06/19/2024)  Social Connections: Socially Integrated (06/19/2024)  Stress: No  Stress Concern Present (06/19/2024)  Tobacco Use: Low Risk (06/19/2024)  Health Literacy: Adequate Health Literacy (06/19/2024)   See flowsheets for full screening details  Depression Screen PHQ 2 & 9 Depression Scale- Over the past 2 weeks, how often have you been bothered by any of the following problems? Little interest or pleasure in doing things: 0 Feeling down, depressed, or hopeless (PHQ Adolescent also includes...irritable): 0 PHQ-2 Total Score: 0 Trouble falling or staying asleep, or sleeping too much: 2 Feeling tired or having little energy: 1 Poor appetite or overeating (PHQ Adolescent also includes...weight loss): 1 (dental issues) Feeling bad about yourself - or that you are a failure or have let yourself or your family down: 0 Trouble  concentrating on things, such as reading the newspaper or watching television (PHQ Adolescent also includes...like school work): 0 Moving or speaking so slowly that other people could have noticed. Or the opposite - being so fidgety or restless that you have been moving around a lot more than usual: 0 Thoughts that you would be better off dead, or of hurting yourself in some way: 0 PHQ-9 Total Score: 4 If you checked off any problems, how difficult have these problems made it for you to do your work, take care of things at home, or get along with other people?: Not difficult at all  Depression Treatment Depression Interventions/Treatment : Currently on Treatment     Goals Addressed               This Visit's Progress     Exercise 150 min/wk Moderate Activity (pt-stated)               Objective:    Today's Vitals   06/19/24 0845  Weight: 130 lb (59 kg)  Height: 5' 3 (1.6 m)   Body mass index is 23.03 kg/m.  Hearing/Vision screen Hearing Screening - Comments:: Denies hearing loss  Vision Screening - Comments:: UTD w/ [facility] Immunizations and Health Maintenance Health Maintenance  Topic Date Due   Pneumococcal Vaccine: 50+ Years (1 of 1 - PCV) Never done   Mammogram  06/06/2024   Influenza Vaccine  09/30/2024 (Originally 02/01/2024)   Medicare Annual Wellness (AWV)  06/19/2025   Bone Density Scan  08/01/2025   Colonoscopy  04/26/2028   DTaP/Tdap/Td (2 - Tdap) 05/17/2031   Hepatitis C Screening  Completed   Zoster Vaccines- Shingrix  Completed   Meningococcal B Vaccine  Aged Out   COVID-19 Vaccine  Discontinued        Assessment/Plan:  This is a routine wellness examination for [place].  Patient Care Team: Vicci Duwaine SQUIBB, DO as PCP - General (Family Medicine) Pa, [facility] (Optometry) Cleotilde Barrio, MD (Orthopedic Surgery) Dasher, Alm LABOR, MD (Dermatology)  I have personally reviewed and noted the following in the  patients chart:   Medical and social history Use of alcohol, tobacco or illicit drugs  Current medications and supplements including opioid prescriptions. Functional ability and status Nutritional status Physical activity Advanced directives List of other physicians Hospitalizations, surgeries, and ER visits in previous 12 months Vitals Screenings to include cognitive, depression, and falls Referrals and appointments  Orders Placed This Encounter  Procedures   MM 3D SCREENING MAMMOGRAM BILATERAL BREAST    Standing Status:   Future    Expected Date:   06/20/2024    Expiration Date:   06/19/2025    Reason for Exam (SYMPTOM  OR DIAGNOSIS REQUIRED):   screening for breast cancer  Preferred imaging location?:   [facility]   In addition, I have reviewed and discussed with patient certain preventive protocols, quality metrics, and best practice recommendations. A written personalized care plan for preventive services as well as general preventive health recommendations were provided to patient.   Vina Ned, CMA   06/19/2024   Return in 1 year (on 06/23/2025) for Medicare Annual Wellness Visit.  After Visit Summary: (MyChart) Due to this being a telephonic visit, the after visit summary with patients personalized plan was offered to patient via MyChart   Nurse Notes:  6 CIT Score - 2 Placed order for [facility] (due~06/06/24) Declined Flu, Covid and Pneumonia vaccines

## 2024-06-19 NOTE — Progress Notes (Signed)
 BP 132/68   Pulse 74   Temp 98 F (36.7 C) (Oral)   Ht 5' 3 (1.6 m)   Wt 131 lb 6.4 oz (59.6 kg)   SpO2 98%   BMI 23.28 kg/m    Subjective:    Patient ID: Kathryn Conway, female    DOB: Jul 25, 1950, 73 y.o.   MRN: [id number]  HPI: Kathryn Conway is a 73 y.o. female presenting on 06/19/2024 for comprehensive medical examination. Current medical complaints include:  HYPERLIPIDEMIA Hyperlipidemia status: stable Satisfied with current treatment?  no Side effects:  yes- myalgias on the atorvastatin  Medication compliance: poor compliance Past cholesterol meds: atorvastatin  Supplements: none Aspirin:  no The 10-year ASCVD risk score (Arnett DK, et al., 2019) is: 10%   Values used to calculate the score:     Age: 62 years     Clinically relevant sex: Female     Is Non-Hispanic African American: No     Diabetic: No     Tobacco smoker: No     Systolic Blood Pressure: 132 mmHg     Is BP treated: No     HDL Cholesterol: 67 mg/dL     Total Cholesterol: 142 mg/dL Chest pain:  no Coronary artery disease:  no  DEPRESSION Mood status: controlled Satisfied with current treatment?: yes Symptom severity: mild  Duration of current treatment : chronic Side effects: no Medication compliance: excellent compliance Psychotherapy/counseling: no  Previous psychiatric medications: celexa  Depressed mood: no Anxious mood: no Anhedonia: no Significant weight loss or gain: no Insomnia: no  Fatigue: no Feelings of worthlessness or guilt: no Impaired concentration/indecisiveness: no Suicidal ideations: no Hopelessness: no Crying spells: no    06/19/2024    9:00 AM 06/15/2023    1:45 PM 06/07/2023   10:34 AM 11/21/2022    4:09 PM 05/24/2022   10:47 AM  Depression screen PHQ 2/9  Decreased Interest 0 0 0 0 0  Down, Depressed, Hopeless 0 0 0 0 0  PHQ - 2 Score 0 0 0 0 0  Altered sleeping 2 2 0 2 3  Tired, decreased energy 1 2 0 1 2  Change in appetite 1 0 0 0 0  Feeling bad  or failure about yourself  0 0 0 0 0  Trouble concentrating 0 0 0 0 0  Moving slowly or fidgety/restless 0 0 0 0 0  Suicidal thoughts 0 0 0 0 0  PHQ-9 Score 4 4  0  3  5   Difficult doing work/chores Not difficult at all Not difficult at all Not difficult at all Not difficult at all Not difficult at all     Data saved with a previous flowsheet row definition    INSOMNIA Duration: chronic Satisfied with sleep quality: yes Difficulty falling asleep: no Difficulty staying asleep: no Waking a few hours after sleep onset: no Early morning awakenings: no Daytime hypersomnolence: no Wakes feeling refreshed: no Good sleep hygiene: yes Apnea: no Snoring: no Depressed/anxious mood: no Recent stress: no Restless legs/nocturnal leg cramps: no Chronic pain/arthritis: no History of sleep study: no Treatments attempted: melatonin, uinsom, benadryl, and ambien    OSTEOPOROSIS Duration: chronic Satisfied with current treatment?: no Medication side effects: yes- had issues with her teeth, so she stopped her fosamax  Medication compliance: poor compliance Past osteoporosis medications/treatments: fosamax  Adequate calcium  & vitamin D: yes Intolerance to bisphosphonates:yes Weight bearing exercises: yes  URINARY SYMPTOMS Durations: 3 weeks Dysuria: no Urinary frequency: yes Urgency: yes Small volume voids: no Symptom severity: no  Urinary incontinence: yes Foul odor: no Hematuria: no Abdominal pain: no Back pain: no Suprapubic pain/pressure: no Flank pain: no Fever:  no Vomiting: no Relief with cranberry juice: no Relief with pyridium: no Status: worse Previous urinary tract infection: no Recurrent urinary tract infection: no History of sexually transmitted disease: no Vaginal discharge: no Treatments attempted: none   Menopausal Symptoms: no  Depression Screen done today and results listed below:     06/19/2024    9:00 AM 06/15/2023    1:45 PM 06/07/2023   10:34 AM  11/21/2022    4:09 PM 05/24/2022   10:47 AM  Depression screen PHQ 2/9  Decreased Interest 0 0 0 0 0  Down, Depressed, Hopeless 0 0 0 0 0  PHQ - 2 Score 0 0 0 0 0  Altered sleeping 2 2 0 2 3  Tired, decreased energy 1 2 0 1 2  Change in appetite 1 0 0 0 0  Feeling bad or failure about yourself  0 0 0 0 0  Trouble concentrating 0 0 0 0 0  Moving slowly or fidgety/restless 0 0 0 0 0  Suicidal thoughts 0 0 0 0 0  PHQ-9 Score 4 4  0  3  5   Difficult doing work/chores Not difficult at all Not difficult at all Not difficult at all Not difficult at all Not difficult at all     Data saved with a previous flowsheet row definition     Past Medical History:  Past Medical History:  Diagnosis Date   Depression    Fibromyalgia    Hyperlipidemia    Insomnia    PONV (postoperative nausea and vomiting)    TMJ (temporomandibular joint disorder)    Uterine fibroid    Wears contact lenses    Wears dentures    full upper, partial lower    Surgical History:  Past Surgical History:  Procedure Laterality Date   COLONOSCOPY  05/20/2012   repeat 5 years   COLONOSCOPY WITH PROPOFOL  N/A 04/26/2018   Procedure: COLONOSCOPY WITH PROPOFOL ;  Surgeon: Jinny Carmine, MD;  Location: [facility];  Service: Endoscopy;  Laterality: N/A;   laparoscopic gyn procedure     done before her hysterectomy   OOPHORECTOMY Left    TOTAL ABDOMINAL HYSTERECTOMY      Medications:  Current Outpatient Medications on File Prior to Visit  Medication Sig   loratadine (CLARITIN) 10 MG tablet Take 10 mg by mouth daily.   MEGARED OMEGA-3 KRILL OIL PO Take by mouth daily.   No current facility-administered medications on file prior to visit.    Allergies:  Allergies[1]  Social History:  Social History   Socioeconomic History   Marital status: Married    Spouse name: Ozzie   Number of children: 2   Years of education: Not on file   Highest education level: Not on file  Occupational  History   Occupation: part time   Tobacco Use   Smoking status: Never   Smokeless tobacco: Never  Vaping Use   Vaping status: Never Used  Substance and Sexual Activity   Alcohol use: Yes    Alcohol/week: 3.0 standard drinks of alcohol    Types: 3 Glasses of wine per week    Comment: 1 glass of wine 3 times per week   Drug use: No   Sexual activity: Yes    Birth control/protection: Surgical    Comment: Hysterectomy  Other Topics Concern   Not on file  Social History  Narrative   Still work part-time 8 hours/day, 3 days/wk at [employer]   Tobacco Use: Low Risk (06/19/2024)   Patient History    Smoking Tobacco Use: Never    Smokeless Tobacco Use: Never    Passive Exposure: Not on file  Financial Resource Strain: Low Risk (06/19/2024)   Overall Financial Resource Strain (CARDIA)    Difficulty of Paying Living Expenses: Not hard at all  Food Insecurity: No Food Insecurity (06/19/2024)   Epic    Worried About Programme Researcher, Broadcasting/film/video in the Last Year: Never true    Ran Out of Food in the Last Year: Never true  Transportation Needs: No Transportation Needs (06/19/2024)   Epic    Lack of Transportation (Medical): No    Lack of Transportation (Non-Medical): No  Physical Activity: Inactive (06/19/2024)   Exercise Vital Sign    Days of Exercise per Week: 0 days    Minutes of Exercise per Session: 0 min  Stress: No Stress Concern Present (06/19/2024)   [employer] of Occupational Health - Occupational Stress Questionnaire    Feeling of Stress: Not at all  Social Connections: Socially Integrated (06/19/2024)   Social Connection and Isolation Panel    Frequency of Communication with Friends and Family: More than three times a week    Frequency of Social Gatherings with Friends and Family: More than three times a week    Attends Religious Services: More than 4 times per year    Active Member of Clubs or Organizations: Yes     Attends Banker Meetings: More than 4 times per year    Marital Status: Married  Catering Manager Violence: Not At Risk (06/19/2024)   Epic    Fear of Current or Ex-Partner: No    Emotionally Abused: No    Physically Abused: No    Sexually Abused: No  Depression (PHQ2-9): Low Risk (06/19/2024)   Depression (PHQ2-9)    PHQ-2 Score: 4  Alcohol Screen: Low Risk (06/07/2023)   Alcohol Screen    Last Alcohol Screening Score (AUDIT): 3  Housing: Low Risk (06/19/2024)   Epic    Unable to Pay for Housing in the Last Year: No    Number of Times Moved in the Last Year: 0    Homeless in the Last Year: No  Utilities: Not At Risk (06/19/2024)   Epic    Threatened with loss of utilities: No  Health Literacy: Adequate Health Literacy (06/19/2024)   B1300 Health Literacy    Frequency of need for help with medical instructions: Never   Tobacco Use History[2] Social History   Substance and Sexual Activity  Alcohol Use Yes   Alcohol/week: 3.0 standard drinks of alcohol   Types: 3 Glasses of wine per week   Comment: 1 glass of wine 3 times per week    Family History:  Family History  Problem Relation Age of Onset   Heart disease Mother    Hypertension Mother    Depression Mother    Osteoporosis Mother    Colon polyps Mother    Cirrhosis Mother        non alcoholic   Liver disease Mother        fatty liver disease   Heart disease Father    Hypertension Father    Colon polyps Father    Stroke Father    Heart disease Sister    Hypertension Sister    Colon polyps Sister  Diabetes Sister    Hypertension Daughter    Colon polyps Daughter    Heart attack Brother    Hypertension Brother    Heart attack Brother    Hyperlipidemia Son    Breast cancer Neg Hx     Past medical history, surgical history, medications, allergies, family history and social history reviewed with patient today and changes made to appropriate areas of the  chart.   Review of Systems  Constitutional: Negative.   HENT: Negative.    Eyes: Negative.   Respiratory: Negative.    Cardiovascular: Negative.   Gastrointestinal:  Positive for heartburn (just with clindamycin). Negative for abdominal pain, blood in stool, constipation, diarrhea, melena, nausea and vomiting.  Genitourinary: Negative.        Nightly accidents  Musculoskeletal: Negative.   Skin: Negative.    All other ROS negative except what is listed above and in the HPI.      Objective:    BP 132/68   Pulse 74   Temp 98 F (36.7 C) (Oral)   Ht 5' 3 (1.6 m)   Wt 131 lb 6.4 oz (59.6 kg)   SpO2 98%   BMI 23.28 kg/m   Wt Readings from Last 3 Encounters:  06/19/24 131 lb 6.4 oz (59.6 kg)  06/19/24 130 lb (59 kg)  12/13/23 133 lb 12.8 oz (60.7 kg)    Physical Exam Vitals and nursing note reviewed.  Constitutional:      General: She is not in acute distress.    Appearance: Normal appearance. She is not ill-appearing, toxic-appearing or diaphoretic.  HENT:     Head: Normocephalic and atraumatic.     Right Ear: Tympanic membrane, ear canal and external ear normal. There is no impacted cerumen.     Left Ear: Tympanic membrane, ear canal and external ear normal. There is no impacted cerumen.     Nose: Nose normal. No congestion or rhinorrhea.     Mouth/Throat:     Mouth: Mucous membranes are moist.     Pharynx: Oropharynx is clear. No oropharyngeal exudate or posterior oropharyngeal erythema.  Eyes:     General: No scleral icterus.       Right eye: No discharge.        Left eye: No discharge.     Extraocular Movements: Extraocular movements intact.     Conjunctiva/sclera: Conjunctivae normal.     Pupils: Pupils are equal, round, and reactive to light.  Neck:     Vascular: No carotid bruit.  Cardiovascular:     Rate and Rhythm: Normal rate and regular rhythm.     Pulses: Normal pulses.     Heart sounds: No murmur heard.    No friction rub. No gallop.  Pulmonary:      Effort: Pulmonary effort is normal. No respiratory distress.     Breath sounds: Normal breath sounds. No stridor. No wheezing, rhonchi or rales.  Chest:     Chest wall: No tenderness.  Abdominal:     General: Abdomen is flat. Bowel sounds are normal. There is no distension.     Palpations: Abdomen is soft. There is no mass.     Tenderness: There is no abdominal tenderness. There is no right CVA tenderness, left CVA tenderness, guarding or rebound.     Hernia: No hernia is present.  Genitourinary:    Comments: Breast and pelvic exams deferred with shared decision making Musculoskeletal:        General: No swelling, tenderness, deformity or signs of  injury.     Cervical back: Normal range of motion and neck supple. No rigidity. No muscular tenderness.     Right lower leg: No edema.     Left lower leg: No edema.  Lymphadenopathy:     Cervical: No cervical adenopathy.  Skin:    General: Skin is warm and dry.     Capillary Refill: Capillary refill takes less than 2 seconds.     Coloration: Skin is not jaundiced or pale.     Findings: No bruising, erythema, lesion or rash.  Neurological:     General: No focal deficit present.     Mental Status: She is alert and oriented to person, place, and time. Mental status is at baseline.     Cranial Nerves: No cranial nerve deficit.     Sensory: No sensory deficit.     Motor: No weakness.     Coordination: Coordination normal.     Gait: Gait normal.     Deep Tendon Reflexes: Reflexes normal.  Psychiatric:        Mood and Affect: Mood normal.        Behavior: Behavior normal.        Thought Content: Thought content normal.        Judgment: Judgment normal.     Results for orders placed or performed in visit on 12/13/23  Comprehensive metabolic panel with GFR   Collection Time: 12/13/23  9:37 AM  Result Value Ref Range   Glucose 67 (L) 70 - 99 mg/dL   BUN 16 8 - 27 mg/dL   Creatinine, Ser 9.22 0.57 - 1.00 mg/dL   eGFR 83 >40 fO/fpw/8.26    BUN/Creatinine Ratio 21 12 - 28   Sodium 141 134 - 144 mmol/L   Potassium 4.3 3.5 - 5.2 mmol/L   Chloride 102 96 - 106 mmol/L   CO2 24 20 - 29 mmol/L   Calcium  9.2 8.7 - 10.3 mg/dL   Total Protein 6.7 6.0 - 8.5 g/dL   Albumin 4.6 3.9 - 4.9 g/dL   Globulin, Total 2.1 1.5 - 4.5 g/dL   Bilirubin Total 0.3 0.0 - 1.2 mg/dL   Alkaline Phosphatase 67 44 - 121 IU/L   AST 23 0 - 40 IU/L   ALT 19 0 - 32 IU/L  Lipid Panel w/o Chol/HDL Ratio   Collection Time: 12/13/23  9:37 AM  Result Value Ref Range   Cholesterol, Total 142 100 - 199 mg/dL   Triglycerides 84 0 - 149 mg/dL   HDL 67 >60 mg/dL   VLDL Cholesterol Cal 16 5 - 40 mg/dL   LDL Chol Calc (NIH) 59 0 - 99 mg/dL      Assessment & Plan:   Problem List Items Addressed This Visit       Musculoskeletal and Integument   Osteoporosis   Cannot tolerate fosamax . May benefit from osteoporosis clinic- will hold on anything right now to allow her dental surgery to recover.         Other   Hypercholesterolemia   Has been off her atorvastatin - will check labs and treat as needed- may benefit from crestor.      Relevant Orders   CBC with Differential/Platelet   Comprehensive metabolic panel with GFR   Lipid Panel w/o Chol/HDL Ratio   TSH   Insomnia   Under good control on current regimen. Continue current regimen. Continue to monitor. Call with any concerns. Refills given for 6 months. Follow up 6 months.  Recurrent major depression in partial remission   Under good control on current regimen. Continue current regimen. Continue to monitor. Call with any concerns. Refills given.        Relevant Medications   citalopram  (CELEXA ) 40 MG tablet   Other Relevant Orders   CBC with Differential/Platelet   Comprehensive metabolic panel with GFR   TSH   Other Visit Diagnoses       Routine general medical examination at a health care facility    -  Primary   Vaccines up to date/declined. Screening labs checked today. Mammo  orderd. DEXA and colonoscopy up to date. Continue diet and exercise. Call with any concerns.     Enuresis       Will check UA and wet prep. Await results. Treat as needed.   Relevant Orders   Urinalysis, Routine w reflex microscopic   WET PREP FOR TRICH, YEAST, CLUE   Urine Culture        Follow up plan: Return in about 6 months (around 12/18/2024).   LABORATORY TESTING:  - Pap smear: not applicable  IMMUNIZATIONS:   - Tdap: Tetanus vaccination status reviewed: last tetanus booster within 10 years. - Influenza: Refused - Pneumovax: Refused - Prevnar: Refused - COVID: Refused - Shingrix vaccine: Up to date  SCREENING: -Mammogram: Ordered today  - Colonoscopy: Up to date  - Bone Density: Up to date   PATIENT COUNSELING:   Advised to take 1 mg of folate supplement per day if capable of pregnancy.   Sexuality: Discussed sexually transmitted diseases, partner selection, use of condoms, avoidance of unintended pregnancy  and contraceptive alternatives.   Advised to avoid cigarette smoking.  I discussed with the patient that most people either abstain from alcohol or drink within safe limits (<=14/week and <=4 drinks/occasion for males, <=7/weeks and <= 3 drinks/occasion for females) and that the risk for alcohol disorders and other health effects rises proportionally with the number of drinks per week and how often a drinker exceeds daily limits.  Discussed cessation/primary prevention of drug use and availability of treatment for abuse.   Diet: Encouraged to adjust caloric intake to maintain  or achieve ideal body weight, to reduce intake of dietary saturated fat and total fat, to limit sodium intake by avoiding high sodium foods and not adding table salt, and to maintain adequate dietary potassium and calcium  preferably from fresh fruits, vegetables, and low-fat dairy products.    stressed the importance of regular exercise  Injury prevention: Discussed safety belts, safety  helmets, smoke detector, smoking near bedding or upholstery.   Dental health: Discussed importance of regular tooth brushing, flossing, and dental visits.    NEXT PREVENTATIVE PHYSICAL DUE IN 1 YEAR. Return in about 6 months (around 12/18/2024).                [1] Allergies Allergen Reactions   Sulfa Antibiotics Rash   Atorvastatin  Other (See Comments)    myalgias   Clindamycin/Lincomycin Diarrhea    heartburn  [2] Social History Tobacco Use  Smoking Status Never  Smokeless Tobacco Never

## 2024-06-19 NOTE — Assessment & Plan Note (Signed)
Under good control on current regimen. Continue current regimen. Continue to monitor. Call with any concerns. Refills given for 6 months. Follow up 6 months. 

## 2024-06-19 NOTE — Assessment & Plan Note (Signed)
 Cannot tolerate fosamax . May benefit from osteoporosis clinic- will hold on anything right now to allow her dental surgery to recover.

## 2024-06-19 NOTE — Assessment & Plan Note (Signed)
 Under good control on current regimen. Continue current regimen. Continue to monitor. Call with any concerns. Refills given.

## 2024-06-19 NOTE — Patient Instructions (Signed)
 Please call to schedule your mammogram and/or bone density: [facility] at [facility]  Address: [address], [place], [place] [address] Phone: [phone]  [facility] at [facility] [address] [place],  [place]  [address] Phone: [phone]

## 2024-06-19 NOTE — Patient Instructions (Signed)
 Kathryn Conway,  Thank you for taking the time for your Medicare Wellness Visit. I appreciate your continued commitment to your health goals. Please review the care plan we discussed, and feel free to reach out if I can assist you further.  Please note that Annual Wellness Visits do not include a physical exam. Some assessments may be limited, especially if the visit was conducted virtually. If needed, we may recommend an in-person follow-up with your provider.  Ongoing Care Seeing your primary care provider every 3 to 6 months helps us  monitor your health and provide consistent, personalized care.   Referrals If a referral was made during today's visit and you haven't received any updates within two weeks, please contact the referred provider directly to check on the status.  Recommended Screenings:  Please call to schedule your mammogram and/or bone density scan:  [facility] at [facility] Address: [address], [place], [place] Phone: [phone]  [facility] at [facility] [address] [place], [place] [address] Phone: [phone]   Health Maintenance  Topic Date Due   Pneumococcal Vaccine for age over 69 (1 of 1 - PCV) Never done   Flu Shot  02/01/2024   Medicare Annual Wellness Visit  06/06/2024   Breast Cancer Screening  06/06/2024   Osteoporosis screening with Bone Density Scan  08/01/2025   Colon Cancer Screening  04/26/2028   DTaP/Tdap/Td vaccine (2 - Tdap) 05/17/2031   Hepatitis C Screening  Completed   Zoster (Shingles) Vaccine  Completed   Meningitis B Vaccine  Aged Out   COVID-19 Vaccine  Discontinued       06/19/2024    8:52 AM  Advanced Directives  Does Patient Have a Medical Advance Directive? No  Would patient like information on creating a medical advance directive? Yes (MAU/Ambulatory/Procedural Areas - Information given)  Information on Advanced Care Planning can be found at [place]   Secretary of [facility] Advance Health Care Directives Advance Health Care Directives ([email or web address]  You may also get the forms at your doctor's office.  Vision: Annual vision screenings are recommended for early detection of glaucoma, cataracts, and diabetic retinopathy. These exams can also reveal signs of chronic conditions such as diabetes and high blood pressure.  Dental: Annual dental screenings help detect early signs of oral cancer, gum disease, and other conditions linked to overall health, including heart disease and diabetes.  Please see the attached documents for additional preventive care recommendations.

## 2024-06-20 ENCOUNTER — Ambulatory Visit: Payer: Self-pay | Admitting: Family Medicine

## 2024-06-20 LAB — COMPREHENSIVE METABOLIC PANEL WITH GFR
ALT: 12 IU/L (ref 0–32)
AST: 18 IU/L (ref 0–40)
Albumin: 4.3 g/dL (ref 3.8–4.8)
Alkaline Phosphatase: 57 IU/L (ref 49–135)
BUN/Creatinine Ratio: 23 (ref 12–28)
BUN: 15 mg/dL (ref 8–27)
Bilirubin Total: 0.3 mg/dL (ref 0.0–1.2)
CO2: 24 mmol/L (ref 20–29)
Calcium: 9.1 mg/dL (ref 8.7–10.3)
Chloride: 101 mmol/L (ref 96–106)
Creatinine, Ser: 0.64 mg/dL (ref 0.57–1.00)
Globulin, Total: 2.1 g/dL (ref 1.5–4.5)
Glucose: 87 mg/dL (ref 70–99)
Potassium: 4.1 mmol/L (ref 3.5–5.2)
Sodium: 138 mmol/L (ref 134–144)
Total Protein: 6.4 g/dL (ref 6.0–8.5)
eGFR: 94 mL/min/1.73

## 2024-06-20 LAB — CBC WITH DIFFERENTIAL/PLATELET
Basophils Absolute: 0 x10E3/uL (ref 0.0–0.2)
Basos: 0 %
EOS (ABSOLUTE): 0.1 x10E3/uL (ref 0.0–0.4)
Eos: 1 %
Hematocrit: 36.9 % (ref 34.0–46.6)
Hemoglobin: 12.1 g/dL (ref 11.1–15.9)
Immature Grans (Abs): 0 x10E3/uL (ref 0.0–0.1)
Immature Granulocytes: 0 %
Lymphocytes Absolute: 2.4 x10E3/uL (ref 0.7–3.1)
Lymphs: 40 %
MCH: 30.9 pg (ref 26.6–33.0)
MCHC: 32.8 g/dL (ref 31.5–35.7)
MCV: 94 fL (ref 79–97)
Monocytes Absolute: 0.5 x10E3/uL (ref 0.1–0.9)
Monocytes: 9 %
Neutrophils Absolute: 3 x10E3/uL (ref 1.4–7.0)
Neutrophils: 50 %
Platelets: 264 x10E3/uL (ref 150–450)
RBC: 3.91 x10E6/uL (ref 3.77–5.28)
RDW: 13.1 % (ref 11.7–15.4)
WBC: 6 x10E3/uL (ref 3.4–10.8)

## 2024-06-20 LAB — LIPID PANEL W/O CHOL/HDL RATIO
Cholesterol, Total: 260 mg/dL — ABNORMAL HIGH (ref 100–199)
HDL: 68 mg/dL
LDL Chol Calc (NIH): 168 mg/dL — ABNORMAL HIGH (ref 0–99)
Triglycerides: 133 mg/dL (ref 0–149)
VLDL Cholesterol Cal: 24 mg/dL (ref 5–40)

## 2024-06-20 LAB — TSH: TSH: 2.03 u[IU]/mL (ref 0.450–4.500)

## 2024-06-20 MED ORDER — ROSUVASTATIN CALCIUM 5 MG PO TABS: 5.0000 mg | ORAL_TABLET | Freq: Every day | ORAL | 1 refills | Status: AC

## 2024-06-21 LAB — URINE CULTURE: Organism ID, Bacteria: NO GROWTH

## 2024-08-22 ENCOUNTER — Encounter

## 2024-12-18 ENCOUNTER — Ambulatory Visit: Admitting: Family Medicine

## 2025-06-23 ENCOUNTER — Ambulatory Visit
